# Patient Record
Sex: Female | Born: 1955 | Race: Black or African American | Hispanic: No | Smoking: Former smoker
Health system: Southern US, Community
[De-identification: ages and names within clinical notes are randomized; demographics above are authoritative.]

## PROBLEM LIST (undated history)

## (undated) DIAGNOSIS — I10 Essential (primary) hypertension: Secondary | ICD-10-CM

## (undated) DIAGNOSIS — K222 Esophageal obstruction: Secondary | ICD-10-CM

## (undated) DIAGNOSIS — T7840XA Allergy, unspecified, initial encounter: Secondary | ICD-10-CM

## (undated) DIAGNOSIS — E785 Hyperlipidemia, unspecified: Secondary | ICD-10-CM

## (undated) DIAGNOSIS — K219 Gastro-esophageal reflux disease without esophagitis: Secondary | ICD-10-CM

## (undated) DIAGNOSIS — E559 Vitamin D deficiency, unspecified: Secondary | ICD-10-CM

## (undated) DIAGNOSIS — F419 Anxiety disorder, unspecified: Secondary | ICD-10-CM

## (undated) DIAGNOSIS — F329 Major depressive disorder, single episode, unspecified: Secondary | ICD-10-CM

## (undated) DIAGNOSIS — F32A Depression, unspecified: Secondary | ICD-10-CM

## (undated) HISTORY — DX: Depression, unspecified: F32.A

## (undated) HISTORY — DX: Esophageal obstruction: K22.2

## (undated) HISTORY — DX: Anxiety disorder, unspecified: F41.9

## (undated) HISTORY — PX: CHOLECYSTECTOMY: SHX55

## (undated) HISTORY — DX: Gastro-esophageal reflux disease without esophagitis: K21.9

## (undated) HISTORY — DX: Allergy, unspecified, initial encounter: T78.40XA

## (undated) HISTORY — DX: Major depressive disorder, single episode, unspecified: F32.9

## (undated) HISTORY — DX: Vitamin D deficiency, unspecified: E55.9

## (undated) HISTORY — DX: Hyperlipidemia, unspecified: E78.5

## (undated) HISTORY — DX: Essential (primary) hypertension: I10

---

## 1998-01-28 ENCOUNTER — Ambulatory Visit (HOSPITAL_COMMUNITY): Admission: RE | Admit: 1998-01-28 | Discharge: 1998-01-28 | Payer: Self-pay | Admitting: *Deleted

## 1999-09-08 ENCOUNTER — Other Ambulatory Visit: Admission: RE | Admit: 1999-09-08 | Discharge: 1999-09-08 | Payer: Self-pay | Admitting: *Deleted

## 2001-03-28 ENCOUNTER — Other Ambulatory Visit: Admission: RE | Admit: 2001-03-28 | Discharge: 2001-03-28 | Payer: Self-pay | Admitting: Ophthalmology

## 2002-05-26 ENCOUNTER — Other Ambulatory Visit: Admission: RE | Admit: 2002-05-26 | Discharge: 2002-05-26 | Payer: Self-pay | Admitting: Internal Medicine

## 2002-12-11 HISTORY — PX: LEEP: SHX91

## 2003-01-13 ENCOUNTER — Other Ambulatory Visit: Admission: RE | Admit: 2003-01-13 | Discharge: 2003-01-13 | Payer: Self-pay | Admitting: Internal Medicine

## 2003-01-21 ENCOUNTER — Encounter: Payer: Self-pay | Admitting: Internal Medicine

## 2003-01-21 ENCOUNTER — Ambulatory Visit (HOSPITAL_COMMUNITY): Admission: RE | Admit: 2003-01-21 | Discharge: 2003-01-21 | Payer: Self-pay | Admitting: Internal Medicine

## 2003-03-31 ENCOUNTER — Observation Stay (HOSPITAL_COMMUNITY): Admission: RE | Admit: 2003-03-31 | Discharge: 2003-04-01 | Payer: Self-pay | Admitting: Surgery

## 2003-03-31 ENCOUNTER — Encounter (INDEPENDENT_AMBULATORY_CARE_PROVIDER_SITE_OTHER): Payer: Self-pay | Admitting: Specialist

## 2004-01-19 ENCOUNTER — Other Ambulatory Visit: Admission: RE | Admit: 2004-01-19 | Discharge: 2004-01-19 | Payer: Self-pay | Admitting: Internal Medicine

## 2004-03-29 ENCOUNTER — Ambulatory Visit (HOSPITAL_COMMUNITY): Admission: RE | Admit: 2004-03-29 | Discharge: 2004-03-29 | Payer: Self-pay | Admitting: Obstetrics and Gynecology

## 2004-05-23 ENCOUNTER — Other Ambulatory Visit: Admission: RE | Admit: 2004-05-23 | Discharge: 2004-05-23 | Payer: Self-pay | Admitting: Internal Medicine

## 2004-11-11 ENCOUNTER — Emergency Department (HOSPITAL_COMMUNITY): Admission: EM | Admit: 2004-11-11 | Discharge: 2004-11-11 | Payer: Self-pay | Admitting: Emergency Medicine

## 2004-11-12 ENCOUNTER — Ambulatory Visit: Payer: Self-pay | Admitting: Psychiatry

## 2004-11-12 ENCOUNTER — Inpatient Hospital Stay (HOSPITAL_COMMUNITY): Admission: EM | Admit: 2004-11-12 | Discharge: 2004-11-14 | Payer: Self-pay | Admitting: Psychiatry

## 2005-01-19 ENCOUNTER — Other Ambulatory Visit: Admission: RE | Admit: 2005-01-19 | Discharge: 2005-01-19 | Payer: Self-pay | Admitting: Internal Medicine

## 2005-09-18 ENCOUNTER — Inpatient Hospital Stay (HOSPITAL_COMMUNITY): Admission: EM | Admit: 2005-09-18 | Discharge: 2005-09-21 | Payer: Self-pay | Admitting: Psychiatry

## 2005-09-18 ENCOUNTER — Emergency Department (HOSPITAL_COMMUNITY): Admission: EM | Admit: 2005-09-18 | Discharge: 2005-09-18 | Payer: Self-pay | Admitting: Emergency Medicine

## 2005-09-18 ENCOUNTER — Ambulatory Visit: Payer: Self-pay | Admitting: Psychiatry

## 2005-09-29 ENCOUNTER — Ambulatory Visit (HOSPITAL_COMMUNITY): Payer: Self-pay | Admitting: Psychiatry

## 2006-02-05 ENCOUNTER — Ambulatory Visit (HOSPITAL_COMMUNITY): Payer: Self-pay | Admitting: Psychiatry

## 2006-02-13 ENCOUNTER — Other Ambulatory Visit: Admission: RE | Admit: 2006-02-13 | Discharge: 2006-02-13 | Payer: Self-pay | Admitting: Internal Medicine

## 2007-02-18 ENCOUNTER — Other Ambulatory Visit: Admission: RE | Admit: 2007-02-18 | Discharge: 2007-02-18 | Payer: Self-pay | Admitting: Internal Medicine

## 2007-02-25 ENCOUNTER — Ambulatory Visit (HOSPITAL_COMMUNITY): Admission: RE | Admit: 2007-02-25 | Discharge: 2007-02-25 | Payer: Self-pay | Admitting: Internal Medicine

## 2007-12-12 HISTORY — PX: ESOPHAGEAL DILATION: SHX303

## 2008-01-06 ENCOUNTER — Ambulatory Visit: Payer: Self-pay | Admitting: Gastroenterology

## 2008-01-09 ENCOUNTER — Ambulatory Visit: Payer: Self-pay | Admitting: Gastroenterology

## 2008-01-09 DIAGNOSIS — I119 Hypertensive heart disease without heart failure: Secondary | ICD-10-CM

## 2008-01-09 DIAGNOSIS — F418 Other specified anxiety disorders: Secondary | ICD-10-CM

## 2008-01-09 DIAGNOSIS — F339 Major depressive disorder, recurrent, unspecified: Secondary | ICD-10-CM | POA: Insufficient documentation

## 2008-01-09 DIAGNOSIS — K222 Esophageal obstruction: Secondary | ICD-10-CM

## 2008-01-09 DIAGNOSIS — K219 Gastro-esophageal reflux disease without esophagitis: Secondary | ICD-10-CM

## 2008-01-22 ENCOUNTER — Ambulatory Visit (HOSPITAL_COMMUNITY): Admission: RE | Admit: 2008-01-22 | Discharge: 2008-01-22 | Payer: Self-pay | Admitting: Gastroenterology

## 2008-01-29 ENCOUNTER — Ambulatory Visit: Payer: Self-pay | Admitting: Gastroenterology

## 2008-02-18 ENCOUNTER — Other Ambulatory Visit: Admission: RE | Admit: 2008-02-18 | Discharge: 2008-02-18 | Payer: Self-pay | Admitting: Internal Medicine

## 2008-08-25 ENCOUNTER — Telehealth: Payer: Self-pay | Admitting: Gastroenterology

## 2008-12-25 ENCOUNTER — Emergency Department (HOSPITAL_COMMUNITY): Admission: EM | Admit: 2008-12-25 | Discharge: 2008-12-25 | Payer: Self-pay | Admitting: Emergency Medicine

## 2009-01-06 ENCOUNTER — Ambulatory Visit (HOSPITAL_COMMUNITY): Admission: RE | Admit: 2009-01-06 | Discharge: 2009-01-06 | Payer: Self-pay | Admitting: Internal Medicine

## 2009-09-27 ENCOUNTER — Ambulatory Visit (HOSPITAL_COMMUNITY): Admission: RE | Admit: 2009-09-27 | Discharge: 2009-09-27 | Payer: Self-pay | Admitting: Internal Medicine

## 2009-11-16 ENCOUNTER — Other Ambulatory Visit: Admission: RE | Admit: 2009-11-16 | Discharge: 2009-11-16 | Payer: Self-pay | Admitting: Internal Medicine

## 2009-12-23 ENCOUNTER — Encounter (INDEPENDENT_AMBULATORY_CARE_PROVIDER_SITE_OTHER): Payer: Self-pay | Admitting: *Deleted

## 2010-01-20 ENCOUNTER — Ambulatory Visit (HOSPITAL_COMMUNITY): Admission: RE | Admit: 2010-01-20 | Discharge: 2010-01-20 | Payer: Self-pay | Admitting: Internal Medicine

## 2010-01-26 ENCOUNTER — Encounter (INDEPENDENT_AMBULATORY_CARE_PROVIDER_SITE_OTHER): Payer: Self-pay | Admitting: *Deleted

## 2010-01-27 ENCOUNTER — Telehealth: Payer: Self-pay | Admitting: Gastroenterology

## 2010-01-27 ENCOUNTER — Ambulatory Visit: Payer: Self-pay | Admitting: Gastroenterology

## 2010-02-08 ENCOUNTER — Ambulatory Visit: Payer: Self-pay | Admitting: Gastroenterology

## 2010-02-08 LAB — HM COLONOSCOPY

## 2011-01-01 ENCOUNTER — Encounter: Payer: Self-pay | Admitting: Obstetrics and Gynecology

## 2011-01-10 NOTE — Letter (Signed)
Summary: [facility] Instructions  [facility]  [address] [place], [place] [address]   Phone: [phone]  Fax: [phone]       Kaitlyn [name]    09/01/1956    MRN: [id number]        Procedure Day Dorna Bloom:  Jake Shark  02/08/10     Arrival Time:  8:00AM     Procedure Time:  9:00AM     Location of Procedure:                    Juliann Pares _  [facility] (4th Floor)                        PREPARATION FOR COLONOSCOPY WITH MOVIPREP   Starting 5 days prior to your procedure 02/03/10 do not eat nuts, seeds, popcorn, corn, beans, peas,  salads, or any raw vegetables.  Do not take any fiber supplements (e.g. Metamucil, Citrucel, and Benefiber).  THE DAY BEFORE YOUR PROCEDURE         DATE: 02/07/10  DAY: MONDAY  1.  Drink clear liquids the entire day-NO SOLID FOOD  2.  Do not drink anything colored red or purple.  Avoid juices with pulp.  No orange juice.  3.  Drink at least 64 oz. (8 glasses) of fluid/clear liquids during the day to prevent dehydration and help the prep work efficiently.  CLEAR LIQUIDS INCLUDE: Water Jello Ice Popsicles Tea (sugar ok, no milk/cream) Powdered fruit flavored drinks Coffee (sugar ok, no milk/cream) Gatorade Juice: apple, white grape, white cranberry  Lemonade Clear bullion, consomm, broth Carbonated beverages (any kind) Strained chicken noodle soup Hard Candy                             4.  In the morning, mix first dose of MoviPrep solution:    Empty 1 Pouch A and 1 Pouch B into the disposable container    Add lukewarm drinking water to the top line of the container. Mix to dissolve    Refrigerate (mixed solution should be used within 24 hrs)  5.  Begin drinking the prep at 5:00 p.m. The MoviPrep container is divided by 4 marks.   Every 15 minutes drink the solution down to the next mark (approximately 8 oz) until the full liter is complete.   6.  Follow completed prep with 16 oz of clear liquid of your choice (Nothing  red or purple).  Continue to drink clear liquids until bedtime.  7.  Before going to bed, mix second dose of MoviPrep solution:    Empty 1 Pouch A and 1 Pouch B into the disposable container    Add lukewarm drinking water to the top line of the container. Mix to dissolve    Refrigerate  THE DAY OF YOUR PROCEDURE      DATE: 02/08/10  DAY: TUESDAY  Beginning at 4:00a.m. (5 hours before procedure):         1. Every 15 minutes, drink the solution down to the next mark (approx 8 oz) until the full liter is complete.  2. Follow completed prep with 16 oz. of clear liquid of your choice.    3. You may drink clear liquids until 7:00AM (2 HOURS BEFORE PROCEDURE).   MEDICATION INSTRUCTIONS  Unless otherwise instructed, you should take regular prescription medications with a small sip of water   as early as possible the morning  of your procedure.         OTHER INSTRUCTIONS  You will need a responsible adult at least 53 years of age to accompany you and drive you home.   This person must remain in the waiting room during your procedure.  Wear loose fitting clothing that is easily removed.  Leave jewelry and other valuables at home.  However, you may wish to bring a book to read or  an iPod/MP3 player to listen to music as you wait for your procedure to start.  Remove all body piercing jewelry and leave at home.  Total time from sign-in until discharge is approximately 2-3 hours.  You should go home directly after your procedure and rest.  You can resume normal activities the  day after your procedure.  The day of your procedure you should not:   Drive   Make legal decisions   Operate machinery   Drink alcohol   Return to work  You will receive specific instructions about eating, activities and medications before you leave.    The above instructions have been reviewed and explained to me by  Wyona Almas RN  January 27, 2010 10:21 AM     I fully understand and  can verbalize these instructions _____________________________ Date _________

## 2011-01-10 NOTE — Progress Notes (Signed)
Summary: Medication refill   Phone Note Call from Patient Call back at Home Phone [phone]   Caller: Patient Call For: Dr. Arlyce Dice Reason for Call: Refill Medication Summary of Call: Pt. needs her Omeprazole refilled. [facility] Initial call taken by: Karna Christmas,  January 27, 2010 10:31 AM  Follow-up for Phone Call        ok Follow-up by: Louis Meckel MD,  January 27, 2010 3:14 PM    Prescriptions: OMEPRAZOLE 20 MG  CPDR (OMEPRAZOLE) take 2 capsules once at bedtime every night  #60 x 11   Entered by:   Chales Abrahams CMA (AAMA)   Authorized by:   Louis Meckel MD   Signed by:   Chales Abrahams CMA (AAMA) on 01/27/2010   Method used:   [facility]        [facility] [address] [phone]* (retail)       [address]       [place], [place]  [address]       Ph: [phone] or [phone]       Fax: [phone]   RxID:   [id number]

## 2011-01-10 NOTE — Miscellaneous (Signed)
Summary: [facility] Previsit/prep  Clinical Lists Changes  Medications: Added new medication of MOVIPREP 100 GM  SOLR (PEG-KCL-NACL-NASULF-NA ASC-C) As per prep instructions. - Signed Rx of MOVIPREP 100 GM  SOLR (PEG-KCL-NACL-NASULF-NA ASC-C) As per prep instructions.;  #1 x 0;  Signed;  Entered by: Wyona Almas RN;  Authorized by: Louis Meckel MD;  Method used: [facility]ically to [facility] [address] [phone]*, [address], [place], [place], [place]  [address], Ph: [phone] or [phone], Fax: [phone] Observations: Added new observation of NKA: T (01/27/2010 9:57)    Prescriptions: MOVIPREP 100 GM  SOLR (PEG-KCL-NACL-NASULF-NA ASC-C) As per prep instructions.  #1 x 0   Entered by:   Wyona Almas RN   Authorized by:   Louis Meckel MD   Signed by:   Wyona Almas RN on 01/27/2010   Method used:   Electronically to        [facility] [address] [phone]* (retail)       [address]       [place], [place]  [address]       Ph: [phone] or [phone]       Fax: [phone]   RxID:   [id number]

## 2011-01-10 NOTE — Procedures (Signed)
Summary: Colonoscopy  Patient: [name] Note: All result statuses are Final unless otherwise noted.  Tests: (1) Colonoscopy (COL)   COL Colonoscopy           DONE     [facility]     [address]     [place], [place]  [address]           COLONOSCOPY PROCEDURE REPORT           PATIENT:  Kaitlyn, [name]  MR#:  [id number]     BIRTHDATE:  06-Aug-1956, 54 yrs. old  GENDER:  female           ENDOSCOPIST:  Barbette Hair. Arlyce Dice, MD     Referred by:           PROCEDURE DATE:  02/08/2010     PROCEDURE:  Colonoscopy, Diagnostic     ASA CLASS:  Class II     INDICATIONS:  Routine Risk Screening           MEDICATIONS:   Fentanyl 75 mcg IV, Versed 7 mg IV, Benadryl 50 mg     IV           DESCRIPTION OF PROCEDURE:   After the risks benefits and     alternatives of the procedure were thoroughly explained, informed     consent was obtained.  Digital rectal exam was performed and     revealed no abnormalities.   The LB CF-[id number] endoscope     was introduced through the anus and advanced to the cecum, which     was identified by both the appendix and ileocecal valve, without     limitations.  The quality of the prep was excellent, using     MoviPrep.  The instrument was then slowly withdrawn as the colon     was fully examined.     <<PROCEDUREIMAGES>>           FINDINGS:  A normal appearing cecum, ileocecal valve, and     appendiceal orifice were identified. The ascending, hepatic     flexure, transverse, splenic flexure, descending, sigmoid colon,     and rectum appeared unremarkable (see image1, image2, image4,     image6, image5, and image7).   Retroflexed views in the rectum     revealed no abnormalities.    The scope was then withdrawn from     the patient and the procedure completed.           COMPLICATIONS:  None           ENDOSCOPIC IMPRESSION:     1) Normal colon     RECOMMENDATIONS:     1) Continue current colorectal screening recommendations for     "routine  risk" patients with a repeat colonoscopy in 10 years.           REPEAT EXAM:  In 10 year(s) for Colonoscopy.           ______________________________     Barbette Hair. Arlyce Dice, MD           CC:  Marisue Brooklyn, DO           n.     eSIGNED:   Barbette Hair. [place] at 02/08/2010 09:48 AM           Elizebeth Koller, [id number]  Note: An exclamation mark (!) indicates a result that was not dispersed into the flowsheet. Document Creation Date: 02/08/2010 9:48 AM _______________________________________________________________________  (1) Order result status: Final  Collection or observation date-time: 02/08/2010 09:41 Requested date-time:  Receipt date-time:  Reported date-time:  Referring Physician:   Ordering Physician: Melvia Heaps [phone]) Specimen Source:  Source: Launa Grill Order Number: [id number] Lab site:   Appended Document: Colonoscopy    Clinical Lists Changes  Observations: Added new observation of COLONNXTDUE: 02/2020 (02/08/2010 10:55)

## 2011-01-10 NOTE — Letter (Signed)
Summary: Previsit letter  [facility]  [address] [place], [place] [address]   Phone: [phone]  Fax: [phone]       12/23/2009 MRN: [id number]  [facility] [address] [place], [place]  [address]  Dear Ms. Edds,  Welcome to the Gastroenterology Division at [facility].    You are scheduled to see a nurse for your pre-procedure visit on 01-11-10 at 2:00p.m. on the 3rd floor at [facility], [address]  We ask that you try to arrive at our office 15 minutes prior to your appointment time to allow for check-in.  Your nurse visit will consist of discussing your medical and surgical history, your immediate family medical history, and your medications.    Please bring a complete list of all your medications or, if you prefer, bring the medication bottles and we will list them.  We will need to be aware of both prescribed and over the counter drugs.  We will need to know exact dosage information as well.  If you are on blood thinners (Coumadin, Plavix, Aggrenox, Ticlid, etc.) please call our office today/prior to your appointment, as we need to consult with your physician about holding your medication.   Please be prepared to read and sign documents such as consent forms, a financial agreement, and acknowledgement forms.  If necessary, and with your consent, a friend or relative is welcome to sit-in on the nurse visit with you.  Please bring your insurance card so that we may make a copy of it.  If your insurance requires a referral to see a specialist, please bring your referral form from your primary care physician.  No co-pay is required for this nurse visit.     If you cannot keep your appointment, please call [phone] to cancel or reschedule prior to your appointment date.  This allows [place] the opportunity to schedule an appointment for another patient in need of care.    Thank you for choosing [facility] for your medical  needs.  We appreciate the opportunity to care for you.  Please visit [place] at our website  to learn more about our practice.                     Sincerely.                                                                                                                   The Gastroenterology Division

## 2011-03-27 LAB — POCT CARDIAC MARKERS: Troponin i, poc: <0.05 ng/mL (ref 0.00–0.09)

## 2011-03-27 LAB — DIFFERENTIAL
Eosinophils Absolute: 0.2 10*3/uL (ref 0.0–0.7)
Eosinophils Relative: 3 % (ref 0–5)
Lymphs Abs: 2 10*3/uL (ref 0.7–4.0)
Monocytes Absolute: 0.5 10*3/uL (ref 0.1–1.0)

## 2011-03-27 LAB — COMPREHENSIVE METABOLIC PANEL
ALT: 16 U/L (ref 0–35)
AST: 20 U/L (ref 0–37)
Albumin: 3.7 g/dL (ref 3.5–5.2)
CO2: 27 mEq/L (ref 19–32)
Calcium: 9.6 mg/dL (ref 8.4–10.5)
Chloride: 105 mEq/L (ref 96–112)
GFR calc Af Amer: >60 mL/min (ref >60)
GFR calc non Af Amer: >60 mL/min (ref >60)
Sodium: 140 mEq/L (ref 135–145)

## 2011-03-27 LAB — CBC
MCHC: 34.1 g/dL (ref 30.0–36.0)
RBC: 4.55 MIL/uL (ref 3.87–5.11)
WBC: 5.2 10*3/uL (ref 4.0–10.5)

## 2011-03-27 LAB — LIPASE, BLOOD: Lipase: 24 U/L (ref 11–59)

## 2011-04-25 NOTE — Letter (Signed)
January 06, 2008    Lovenia Kim, D.O.  [address]  [place], [place] [address]   RE:  [name], Kaitlyn  MRN:  [id number]  /  DOB:  1956/05/13   Dear Dr. Elisabeth Most:   Upon your kind referral, I had the pleasure of evaluating your patient  and I am pleased to offer my findings.  I saw Santia Labate in the office  today.  Enclosed is a copy of my progress note that details my findings  and recommendations.   Thank you for the opportunity to participate in your patient's care.    Sincerely,      Barbette Hair. Arlyce Dice, MD,FACG  Electronically Signed    RDK/MedQ  DD: 01/06/2008  DT: 01/06/2008  Job #: [id number]

## 2011-04-25 NOTE — Assessment & Plan Note (Signed)
[facility]                         GASTROENTEROLOGY OFFICE NOTE   Kaitlyn [name], Kaitlyn [name]                       MRN:          [id number]  DATE:01/06/2008                            DOB:          [birth year]    PROBLEM:  Dysphagia.   Kaitlyn [name] is a 55 year old African-American female referred through the  courtesy of Dr. Elisabeth [name] for evaluation.  Over the last 5 to 6 months,  she has been complaining of dysphagia to solids, and now liquids.  She  has frequent cough, especially after retiring.  She has been complaining  of sore throat because of her cough.  She denies pyrosis, per se.  She  has been taking Zegerid in the mid evening with some improvement in her  cough.  She is on no gastric irritants, including nonsteroidals.  She is  constantly clearing her throat.  She has had mild nausea over the past 2  months.   PAST MEDICAL HISTORY:  Pertinent for hypertension and depression.  She  is status post cholecystectomy.  She has glaucoma.   FAMILY HISTORY:  Pertinent for a sister with breast cancer and 2  siblings with diabetes.   MEDICATIONS:  Doxazosin.  Pravastatin.  Bupropion.  Travatan.  Loratadine.  Zegerid.  Baby aspirin.   She has no allergies.   She stopped smoking a year ago.  She drinks rarely.  She is divorced and  works Clinical biochemist for [employer].   REVIEW OF SYSTEMS:  Positive for night sweats, back pain.   EXAM:  Pulse 80, blood pressure 150/90, weight 151.  HEENT:  EOMI. PERRLA. Sclerae are anicteric.  Conjunctivae are pink.  NECK:  Supple without thyromegaly, adenopathy or carotid bruits.  CHEST:  Clear to auscultation and percussion without adventitious  sounds.  CARDIAC:  Regular rhythm; normal S1 S2.  There are no murmurs, gallops  or rubs.  ABDOMEN:  Bowel sounds are normoactive.  Abdomen is soft, non-tender and  non-distended.  There are no abdominal masses, tenderness, splenic  enlargement or hepatomegaly.  EXTREMITIES:  Full range of motion.  No cyanosis, clubbing or edema.  RECTAL:  Deferred.   IMPRESSION:  1. Dysphagia.  This is likely due to peptic stricture.  2. Cough.  I suspect this is related to gastroesophageal reflux.      Gastroparesis is also a consideration.   RECOMMENDATION:  1. I instructed Kaitlyn [name] to take Zegerid nightly.  2. Upper endoscopy with dilatation as indicated.  3. Consider gastric emptying scan if cough does not improve.     Kaitlyn [name]. Kaitlyn Dice, MD,FACG  Electronically Signed    RDK/MedQ  DD: 01/06/2008  DT: 01/06/2008  Job #: [id number]   cc:   Kaitlyn [name], D.O.

## 2011-04-25 NOTE — Assessment & Plan Note (Signed)
[facility]                         GASTROENTEROLOGY OFFICE NOTE   BRELEIGH, CARPINO                       MRN:          [id number]  DATE:01/29/2008                            DOB:          12-Jun-1956    PROBLEM:  Cough.   Ms. Lafontant has returned for scheduled GI followup.  Upper endoscopy  demonstrated a distal esophageal stricture.  This was dilated to 18 mm.  On a regimen of Zegerid 40 mg nightly, her cough has entirely  disappeared.  She no longer has dysphagia.  Her insurance company has  requested that we switch her to omeprazole.  A gastric emptying scan did  demonstrate delayed emptying.  There was 45.9% remaining at 2 hours with  normal less than 30%.  Ms. Benton denies nausea or bloating.   PHYSICAL EXAMINATION:  Pulse 88, blood pressure 128/80, weight 163.   IMPRESSION:  1. Gastroesophageal reflux disease - well controlled with Zegerid.      Gastroparesis certainly may be contributing.  2. Esophageal stricture - asymptomatic following dilatation therapy.  3. Gastroparesis.   RECOMMENDATIONS:  Switch to omeprazole 40 mg nightly.  If she remains  asymptomatic, then I do not think we have to treat her gastroparesis.  Should cough recur, then I would consider adding Reglan.     Barbette Hair. Arlyce Dice, MD,FACG  Electronically Signed    RDK/MedQ  DD: 01/29/2008  DT: 01/30/2008  Job #: [id number]   cc:   Lovenia Kim, D.O.

## 2011-04-25 NOTE — Letter (Signed)
January 06, 2008    Kaitlyn [name]. Kaitlyn [name]   RE:  EDDIS, PINGLETON  MRN:  [id number]  /  DOB:  [birth year]   Dear Ms. Balke:   It is my pleasure to have treated you recently as a new patient in my  office.  I appreciate your confidence and the opportunity to participate  in your care.   Since I do have a busy inpatient endoscopy schedule and office schedule,  my office hours vary weekly.  I am, however, available for emergency  calls every day through my office.  If I cannot promptly meet an urgent  office appointment, another one of our gastroenterologists will be able  to assist you.   My well-trained staff are prepared to help you at all times.  For  emergencies after office hours, a physician from our gastroenterology  section is always available through my 24-hour answering service.   While you are under my care, I encourage discussion of your questions  and concerns, and I will be happy to return your calls as soon as I am  available.   Once again, I welcome you as a new patient and I look forward to a happy  and healthy relationship.    Sincerely,      Barbette Hair. Arlyce Dice, MD,FACG  Electronically Signed   RDK/MedQ  DD: 01/06/2008  DT: 01/06/2008  Job #: [id number]

## 2011-04-28 NOTE — Discharge Summary (Signed)
Kaitlyn [name], Kaitlyn [name]                ACCOUNT NO.:  [id number]   MEDICAL RECORD NO.:  [id number]          PATIENT TYPE:  IPS   LOCATION:  0508                          [facility]ILITY[facility]  [facility]   PHYSICIAN:  Jeanice Lim, M.D. DATE OF BIRTH:  09/28/1956   DATE OF ADMISSION:  09/18/2005  DATE OF DISCHARGE:  09/21/2005                                 DISCHARGE SUMMARY   IDENTIFYING DATA:  This is a 55 year old married African-American female  involuntarily committed.  Presents with suicide attempt, sitting in car with  windows rolled up and had had marital conflict with husband until husband  apparently told her that he wanted to separated.  She was hoping to pass out  and get a reaction from husband as per patient.  Had denied that this was a  serious attempt.   PAST PSYCHIATRIC HISTORY:  Second admission to [facility] in the summer of 2005.  Overdosed.  Had been on Cymbalta earlier this year but noncompliant with a  further follow-up.   MEDICATIONS:  Hydrochlorothiazide, birth control pill, triamterene.   ALLERGIES:  No known drug allergies.   PHYSICAL EXAMINATION:  Physical and neurologic exam within normal limits.   LABORATORY DATA:  Routine admission labs within normal limits.   MENTAL STATUS EXAM:  In bed, cooperative.  Poor eye contact.  Speech clear.  Mood depressed.  Affect sad and restricted.  Thought processes positive for  suicidal ideation, fleeting, denied acute intent.  Cognition intact.  Judgment and insight poor.   ADMISSION DIAGNOSES:  AXIS I:  Depressive disorder not otherwise specified.  Rule out major depressive disorder, recurrent, moderate.  AXIS II:  Deferred.  AXIS III:  Hypertension, glaucoma.  AXIS IV:  Moderate (problems with primary support group).  AXIS V:  30/60.   HOSPITAL COURSE:  The patient was admitted and ordered routine p.r.n.  medications and underwent further monitoring.  Was encouraged to participate  in individual, group and milieu therapy.  Family  session was requested with  husband.  The patient was initially refusing medications.  She reported  depression, tearful, guarded and reports that her suicidal ideation would  depend on how her husband reacts and was not sure how he was going to react  to what he had done.  Family session went well.  Husband was supportive.  The patient was pleased and reported feeling safe and no longer suicidal.  Reported a positive response to crisis intervention, was sleeping and eating  well.  Affect brighter.  No dangerous ideation.  No mood instability.  No  side effects from medications.  The patient was given medication education.   DISCHARGE MEDICATIONS:  1.  Hydrochlorothiazide 25 mg q.a.m.  2.  Travatan 0.004% ophthalmic solution OP at night.  3.  Necon 1-35 birth control pill 1 daily as previously directed.  4.  Zoloft 25 mg at 6 p.m. for one week and then 50 mg at 6 p.m.  5.  Trazodone 50 mg, 1/2-1 at 8 p.m. p.r.n. insomnia.  6.  Risperdal 0.5 mg at 8 p.m.   FOLLOW UP:  The patient was provided  samples and prescriptions and was to  follow up with Dr. Lolly Mustache September 27, 2005 at 1:15 p.m. and Milbert Coulter  for DBT on September 26, 2005 at 6 p.m.   DISCHARGE DIAGNOSES:  AXIS I:  Depressive disorder not otherwise specified.  Rule out major depressive disorder, recurrent, moderate.  AXIS II:  Deferred.  AXIS III:  Hypertension, glaucoma.  AXIS IV:  Moderate (problems with primary support group).  AXIS V:  GAF on discharge 55.      Jeanice Lim, M.D.  Electronically Signed     JEM/MEDQ  D:  10/15/2005  T:  10/16/2005  Job:  [id number]

## 2011-04-28 NOTE — H&P (Signed)
Kaitlyn [name], Kaitlyn [name]                ACCOUNT NO.:  [id number]   MEDICAL RECORD NO.:  [id number]          PATIENT TYPE:  IPS   LOCATION:  0304                          [facility]ILITY[facility]  [facility]   PHYSICIAN:  Geoffery Lyons, M.D.      DATE OF BIRTH:  [birth year]   DATE OF ADMISSION:  11/12/2004  DATE OF DISCHARGE:                         PSYCHIATRIC ADMISSION ASSESSMENT   IDENTIFYING INFORMATION:  This is an involuntary admission.  This is a 56-  year-old African-American female, separated x 1 week.   REASON FOR ADMISSION AND SYMPTOMS:  Apparently, the patient's brother-in-law  committed her.  She was found to be actively suicidal.  She reported having  attempted in the past.  She would contract for safety.  Apparently, she  separated from her husband of three years last week.  Today, she denies  being suicidal or homicidal.  However, she is quite depressed.  Her affect  is very flat.  She responds minimally.   PAST PSYCHIATRIC HISTORY:  She denies.  However, she has been prescribed  antidepressants in the past.  The most recent was Cymbalta but she has not  picked it up since April.   SOCIAL HISTORY:  She finished high school.  She has been married 3-1/2  years.  She has no children.  She is currently employed at [employer].   FAMILY HISTORY:  She denies.   ALCOHOL/DRUG HISTORY:  She denies.  Indeed, her evaluation in the emergency  room was negative for drugs or alcohol.  However, she was noted to have a  moderate amount of leukocyte esterase so she will be treated for a UTI.   PRIMARY CARE PHYSICIAN:  Dr. Elisabeth Most in [place].   MEDICAL HISTORY:  She is followed for hypertension and glaucoma.   MEDICATIONS:  Currently, [facility], she is prescribed  hydrochlorothiazide 25 mg p.o. q.d., Diovan 320 mg p.o. q.d. and Travatan 1  drop each eye q.h.s. and she also takes Necon 1/35 p.o. q.d. for birth  control.   ALLERGIES:  No known drug allergies.   POSITIVE  PHYSICAL FINDINGS:  She was examined in the emergency room.  There  were no remarkable findings.   MENTAL STATUS EXAM:  She is drowsy but alert.  She is appropriately groomed  and dressed.  Motor and gait are normal.  She had moderately good eye  contact.  Her speech is slow.  Her mood is depressed.  Her affect is flat.  Her thought processes are clear and linear.  Judgment and insight are fair.  Concentration and memory were intact.  Intelligence is at least average.  She currently denies suicidal or homicidal ideation.  She claims she does  not understand where all that came from last night.   DIAGNOSES:   AXIS I:  Major depressive disorder, recurrent, severe with suicidal  ideation.   AXIS II:  Deferred.   AXIS III:  1.  Hypertension.  2.  Glaucoma.  3.  Urinary tract infection.   AXIS IV:  Severe (problems with primary support group).   AXIS V:  35.  PLAN:  She will be admitted for safety and stabilization.  We will treat her  UTI and start an antidepressant.     Mick   MD/MEDQ  D:  11/12/2004  T:  11/12/2004  Job:  [id number]

## 2011-04-28 NOTE — Op Note (Signed)
NAME:  Kaitlyn [name], Kaitlyn [name]                          ACCOUNT NO.:  [id number]   MEDICAL RECORD NO.:  [id number]                   PATIENT TYPE:  AMB   LOCATION:  DAY                                  FACILITY:  [facility]   PHYSICIAN:  Abigail Miyamoto, M.D.              DATE OF BIRTH:  1956/11/19   DATE OF PROCEDURE:  03/31/2003  DATE OF DISCHARGE:                                 OPERATIVE REPORT   PREOPERATIVE DIAGNOSES:  Symptomatic cholelithiasis.   POSTOPERATIVE DIAGNOSES:  Symptomatic cholelithiasis.   PROCEDURE:  Laparoscopic cholecystectomy.   SURGEON:  Abigail Miyamoto, M.D.   ASSISTANT:  Lebron Conners, M.D.   ANESTHESIA:  General endotracheal anesthesia.   ESTIMATED BLOOD LOSS:  Minimal.   DESCRIPTION OF PROCEDURE:  The patient was brought to the operating room,  identified as Kaitlyn [name]. She was placed supine on the operating room  table and general anesthesia was induced. Her abdomen was then prepped and  draped in the usual sterile fashion. Using a #15 blade, a small vertical  incision was made just below the umbilicus through a previous old scar. The  incision was carried down to the fascia which was then opened with the  scalpel. The hemostat was then passed through the peritoneal cavity. Next, a  #0 Vicryl pursestring suture was placed around the fascial opening. The  Hasson port was placed through opening and insufflation of the abdomen was  begun. A 12 mm port was then placed in the patient's epigastrium, two 5 mm  ports were placed in the patient's right flank under direct vision. The  gallbladder was identified and retracted above the liver bed. The liver bed  was found to be quite friable and the liver toward the gallbladder was  elevated. The cystic duct was dissected out and clipped twice proximally,  once distally and transected with the scissors. The cystic artery and branch  were identified and clipped twice proximally, once distally and transected  as  well. The gallbladder was then slowly dissected free from the liver bed  with the electrocautery. Again the liver bed was quite friable during the  dissection. Once the gallbladder was free from the liver bed, hemostasis was  achieved was achieved with the electrocautery and a couple of pieces of  Surgicel. Again the liver bed was examined, hemostasis was achieved. At this  point, the gallbladder was removed through the incision at the umbilicus. #0  Vicryl the umbilicus was tied closing the fascial defect. Again the liver  bed was examined and hemostasis was felt to be achieved. The abdomen was  then copiously irrigated with normal saline. All ports were then removed  under direct vision and the abdomen was deflated. All incisions were then  anesthetized with 0.25% Marcaine and closed with 4-0 Monocryl subcuticular  sutures. Steri-Strips, gauze and tape were then applied. The patient  tolerated the procedure well. All sponge, needle and instrument counts  were  correct at the end of the procedure. The patient was then extubated in the  operating room and taken in stable condition to the recovery room.                                               Abigail Miyamoto, M.D.   DB/MEDQ  D:  03/31/2003  T:  03/31/2003  Job:  [id number]

## 2011-04-28 NOTE — Discharge Summary (Signed)
NAMEJACQULENE, Kaitlyn [name] NO.:  [id number]   MEDICAL RECORD NO.:  [id number]          PATIENT TYPE:  IPS   LOCATION:  0304                          FACILITY:  [facility]   PHYSICIAN:  Jeanice Lim, M.D. DATE OF BIRTH:  10-12-1956   DATE OF ADMISSION:  11/12/2004  DATE OF DISCHARGE:  11/14/2004                                 DISCHARGE SUMMARY   IDENTIFYING DATA:  This is a 55 year old African-American female, separated,  admitted actively suicidal, attempted in the past, unable to contract for  safety. Split with husband last week. Alcohol level is less than 5. Denies  past psychiatric history.   MEDICATIONS:  1.  Hydrochlorothiazide.  2.  Diovan.  3.  Eye drops.  4.  Cymbalta 60 mg a day but not been on this since April.   PHYSICAL AND NEUROLOGICAL EXAMINATION:  Essentially within normal limits.   ROUTINE ADMISSION LABORATORY DATA:  Within normal limits.   MENTAL STATUS EXAM:  Drowsy but alert. Appropriately groomed and dressed.  Speech slow. Mood depressed. Affect flat. Thought processes goal directed  and linear. Cognition intact. Judgment and insight fair.   ADMISSION DIAGNOSES:   AXIS I:  Major depressive disorder, recurrent, moderate.   AXIS II:  Deferred.   AXIS III:  1.  Hypertension.  2.  Glaucoma.  3.  Urinary tract infection.   AXIS IV:  Moderate problems with primary support group.   AXIS V:  35/55 to 60.   HOSPITAL COURSE:  The patient was admitted and ordered routine p.r.n.  medications and underwent further monitoring. Encouraged to participate in  individual, group, and milieu therapy. The patient was reported to have made  threats as per commitment papers and unable to contract for safety. The  patient denied suicidal ideation. Reported did not overdose but family is  concerned secondary to patient being upset with husband and positive history  of three to four attempts in the past. Brother-in-law apparently was  concerned patient  would try to leave and attempt to hurt herself again.  Therefore, the patient was monitored for safety, encouraged to participate  in individual, group, and milieu therapy. Developed healthier coping skills,  gained insight regarding her condition, and remained stabilized on  medications. The patient reported a positive response to crisis intervention  and was discharged in improved condition. Mood euthymic. Affect full.  Improved judgment, insight, and coping skills. The patient was given  medication education and discharged on Ambien 10 mg q.h.s. p.r.n., Cipro 250  q.a.m. and at 8 p.m., hydrochlorothiazide 25 mg q.a.m., Diovan 160 mg 2  q.a.m., Wellbutrin XL 300 mg q.a.m., and Travatan 0.004% ophthalmic solution  1 drop at night. The patient is to follow up with Dr. Marlyne Beards within 7 to  10 days.   DISCHARGE DIAGNOSES:   AXIS I:  Major depressive disorder, recurrent, moderate.   AXIS II:  Deferred.   AXIS III:  1.  Hypertension.  2.  Glaucoma.  3.  Urinary tract infection.   AXIS IV:  Moderate problems with primary support group.   AXIS V:  55.  Jame   JEM/MEDQ  D:  12/07/2004  T:  12/07/2004  Job:  [id number]

## 2011-06-16 ENCOUNTER — Other Ambulatory Visit (HOSPITAL_COMMUNITY): Payer: Self-pay | Admitting: Obstetrics & Gynecology

## 2011-06-16 DIAGNOSIS — Z1231 Encounter for screening mammogram for malignant neoplasm of breast: Secondary | ICD-10-CM

## 2011-08-10 ENCOUNTER — Other Ambulatory Visit: Payer: Self-pay | Admitting: Gastroenterology

## 2011-08-11 MED ORDER — OMEPRAZOLE 20 MG PO CPDR: 20.0000 mg | DELAYED_RELEASE_CAPSULE | Freq: Two times a day (BID) | ORAL | Status: DC

## 2011-08-11 NOTE — Telephone Encounter (Signed)
Medication for pt sent to [facility] in [place]. Pt needs an office appointment to follow up.  Left message for pt.

## 2011-09-01 ENCOUNTER — Ambulatory Visit (HOSPITAL_COMMUNITY)
Admission: RE | Admit: 2011-09-01 | Discharge: 2011-09-01 | Disposition: A | Discharge status: Home / Self Care | Payer: Managed Care, Other (non HMO) | Source: Ambulatory Visit | Attending: Obstetrics & Gynecology | Admitting: Obstetrics & Gynecology

## 2011-09-01 DIAGNOSIS — Z1231 Encounter for screening mammogram for malignant neoplasm of breast: Secondary | ICD-10-CM | POA: Insufficient documentation

## 2011-09-20 ENCOUNTER — Encounter: Payer: Self-pay | Admitting: Gastroenterology

## 2011-10-18 ENCOUNTER — Encounter: Payer: Self-pay | Admitting: Gastroenterology

## 2011-10-18 ENCOUNTER — Ambulatory Visit (INDEPENDENT_AMBULATORY_CARE_PROVIDER_SITE_OTHER): Payer: Managed Care, Other (non HMO) | Admitting: Gastroenterology

## 2011-10-18 VITALS — BP 136/82 | HR 68 | Ht 62.0 in | Wt 159.0 lb

## 2011-10-18 DIAGNOSIS — K219 Gastro-esophageal reflux disease without esophagitis: Secondary | ICD-10-CM

## 2011-10-18 MED ORDER — OMEPRAZOLE 20 MG PO CPDR: 20.0000 mg | DELAYED_RELEASE_CAPSULE | Freq: Every day | ORAL | Status: DC

## 2011-10-18 NOTE — Assessment & Plan Note (Signed)
Symptoms are well-controlled with omeprazole.  Plan to continue with the same. 

## 2011-10-18 NOTE — Progress Notes (Signed)
Kaitlyn [name] is a pleasant 55 year old Afro-American female with history of hypertension, depression, GERD and esophageal stricture here for a checkup exam.  On omeprezole once daily reflux symptoms are well-controlled. She denies dysphagia. She has no lower GI complaints.  Screening colonoscopy in 2011 was normal.      Past Medical History  Diagnosis Date  . Hypertension   . Hyperlipemia   . Depression   . GERD (gastroesophageal reflux disease)   . Esophageal stricture    Past Surgical History  Procedure Date  . Cholecystectomy    Current Outpatient Prescriptions  Medication Sig Dispense Refill  . amLODipine-olmesartan (AZOR) 5-40 MG per tablet Take 1 tablet by mouth daily.        Marland Kitchen amoxicillin (AMOXIL) 500 MG capsule Two tablets by mouth twice day       . aspirin 81 MG tablet Take 81 mg by mouth daily.        . bisoprolol-hydrochlorothiazide (ZIAC) 10-6.25 MG per tablet Take 1 tablet by mouth daily.        Marland Kitchen HYDROcodone-homatropine (HYCODAN) 5-1.5 MG/5ML syrup As needed      . loratadine (CLARITIN) 10 MG tablet Take 10 mg by mouth as needed.        Marland Kitchen omeprazole (PRILOSEC) 20 MG capsule Take 20 mg by mouth 2 (two) times daily. the patient does need an office appointment to follow up.       . pravastatin (PRAVACHOL) 40 MG tablet Take 40 mg by mouth daily.          reports that she has quit smoking. She has never used smokeless tobacco. She reports that she does not drink alcohol or use illicit drugs. family history is negative for Colon cancer.    Review of Systems: She currently has an resolving upper respiratory infection and has mild sinus congestion Pertinent positive and negative review of systems were noted in the above HPI section. All other review of systems were otherwise negative.  Vital signs were reviewed in today's medical record Physical Exam: General: Well developed , well nourished, no acute distress Head: Normocephalic and atraumatic Eyes:  sclerae anicteric,  EOMI Ears: Normal auditory acuity Mouth: No deformity or lesions Neck: Supple, no masses or thyromegaly Lungs: Clear throughout to auscultation Heart: Regular rate and rhythm; no murmurs, rubs or bruits Abdomen: Soft, non tender and non distended. No masses, hepatosplenomegaly or hernias noted. Normal Bowel sounds Rectal:deferred Musculoskeletal: Symmetrical with no gross deformities  Skin: No lesions on visible extremities Pulses:  Normal pulses noted Extremities: No clubbing, cyanosis, edema or deformities noted Neurological: Alert oriented x 4, grossly nonfocal Cervical Nodes:  No significant cervical adenopathy Inguinal Nodes: No significant inguinal adenopathy Psychological:  Alert and cooperative. Normal mood and affect

## 2011-10-18 NOTE — Patient Instructions (Signed)
Follow up as needed

## 2012-03-08 ENCOUNTER — Ambulatory Visit (INDEPENDENT_AMBULATORY_CARE_PROVIDER_SITE_OTHER): Payer: Managed Care, Other (non HMO) | Admitting: Family Medicine

## 2012-03-08 VITALS — BP 134/80 | HR 107 | Temp 101.5°F | Resp 20 | Ht 63.0 in | Wt 157.6 lb

## 2012-03-08 DIAGNOSIS — R05 Cough: Secondary | ICD-10-CM

## 2012-03-08 DIAGNOSIS — R509 Fever, unspecified: Secondary | ICD-10-CM

## 2012-03-08 DIAGNOSIS — J111 Influenza due to unidentified influenza virus with other respiratory manifestations: Secondary | ICD-10-CM

## 2012-03-08 LAB — POCT RAPID STREP A (OFFICE): Rapid Strep A Screen: NEGATIVE

## 2012-03-08 LAB — POCT INFLUENZA A/B
Influenza A, POC: NEGATIVE
Influenza B, POC: POSITIVE

## 2012-03-08 MED ORDER — OSELTAMIVIR PHOSPHATE 75 MG PO CAPS: 75.0000 mg | ORAL_CAPSULE | Freq: Two times a day (BID) | ORAL | Status: AC

## 2012-03-08 MED ORDER — ACETAMINOPHEN 500 MG PO TABS
1000.0000 mg | ORAL_TABLET | Freq: Four times a day (QID) | ORAL | Status: DC | PRN
  Administered 2012-03-08: 1000 mg via ORAL

## 2012-03-08 MED ORDER — HYDROCODONE-HOMATROPINE 5-1.5 MG/5ML PO SYRP: 5.0000 mL | ORAL_SOLUTION | Freq: Three times a day (TID) | ORAL | Status: AC | PRN

## 2012-03-08 NOTE — Progress Notes (Signed)
  Patient Name: [name] Date of Birth: 10-12-1956 Medical Record Number: [id number] Gender: female Date of Encounter: 03/08/2012  History of Present Illness:  Kaitlyn [name] is a 56 y.o. very pleasant female patient who presents with the following:  Went to jury duty on Tuesday- noted onset of a cough.  Also noted onset of fever, ST and body aches.  No GI symptoms. No sick contacts.   History of depression, HTN and gerd- otherwise generally healthy.    Patient Active Problem List  Diagnoses  . DEPRESSIVE DISORDER NOT ELSEWHERE CLASSIFIED  . 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 ESOPHAGEAL STRICTURE  . Esophageal Reflux   Past Medical History  Diagnosis Date  . Hypertension   . Hyperlipemia   . Depression   . GERD (gastroesophageal reflux disease)   . Esophageal stricture    Past Surgical History  Procedure Date  . Cholecystectomy    History  Substance Use Topics  . Smoking status: Former Smoker -- 0.5 packs/day for 3 years    Types: Cigarettes    Quit date: 03/08/2002  . Smokeless tobacco: Never Used  . Alcohol Use: No   Family History  Problem Relation Age of Onset  . Colon cancer Neg Hx    No Known Allergies  Medication list has been reviewed and updated.  Review of Systems: As per HPI- otherwise negative. No medications today- she took nyquil last night  Physical Examination: Filed Vitals:   03/08/12 1314  BP: 134/80  Pulse: 107  Temp: 101.5 F (38.6 C)  TempSrc: Oral  Resp: 20  Height: 5\' 3"  (1.6 m)  Weight: 157 lb 9.6 oz (71.487 kg)   Temp 99.2 recheck Body mass index is 27.92 kg/(m^2).  GEN: WDWN, NAD, Non-toxic, A & O x 3.  Appears tired HEENT: Atraumatic, Normocephalic. Neck supple. No masses, No LAD.  TM wnl, oropharynx wnl Ears and Nose: No external deformity. CV: RRR, No M/G/R. No JVD. No thrill. No extra heart sounds. PULM: CTA B, no wheezes, crackles, rhonchi. No retractions. No resp. distress. No accessory muscle use. ABD:  S, NT, ND. No rebound. No HSM. EXTR: No c/c/e NEURO Normal gait.  PSYCH: Normally interactive. Conversant. Not depressed or anxious appearing.  Calm demeanor.   Results for orders placed in visit on 03/08/12  POCT INFLUENZA A/B      Component Value Range   Influenza A, POC Negative     Influenza B, POC Positive    POCT RAPID STREP A (OFFICE)      Component Value Range   Rapid Strep A Screen Negative  Negative    Assessment and Plan: 1. Fever  acetaminophen (TYLENOL) tablet 1,000 mg, POCT Influenza A/B, POCT rapid strep A, oseltamivir (TAMIFLU) 75 MG capsule  2. Cough  POCT Influenza A/B, POCT rapid strep A, HYDROcodone-homatropine (HYCODAN) 5-1.5 MG/5ML syrup  3. Influenza  oseltamivir (TAMIFLU) 75 MG capsule   Rest, fluids. Patient (or parent if minor) instructed to return to clinic or call if not better in 3 day(s).

## 2012-10-02 ENCOUNTER — Other Ambulatory Visit (HOSPITAL_COMMUNITY): Payer: Self-pay | Admitting: Obstetrics & Gynecology

## 2012-10-02 DIAGNOSIS — Z1231 Encounter for screening mammogram for malignant neoplasm of breast: Secondary | ICD-10-CM

## 2012-10-17 ENCOUNTER — Ambulatory Visit (HOSPITAL_COMMUNITY)
Admission: RE | Admit: 2012-10-17 | Discharge: 2012-10-17 | Disposition: A | Discharge status: Home / Self Care | Payer: Managed Care, Other (non HMO) | Source: Ambulatory Visit | Attending: Obstetrics & Gynecology | Admitting: Obstetrics & Gynecology

## 2012-10-17 DIAGNOSIS — Z1231 Encounter for screening mammogram for malignant neoplasm of breast: Secondary | ICD-10-CM | POA: Insufficient documentation

## 2012-12-27 ENCOUNTER — Ambulatory Visit (HOSPITAL_COMMUNITY)
Admission: RE | Admit: 2012-12-27 | Discharge: 2012-12-27 | Disposition: A | Discharge status: Home / Self Care | Payer: Managed Care, Other (non HMO) | Source: Ambulatory Visit | Attending: Internal Medicine | Admitting: Internal Medicine

## 2012-12-27 ENCOUNTER — Other Ambulatory Visit (HOSPITAL_COMMUNITY): Payer: Self-pay | Admitting: Internal Medicine

## 2012-12-27 DIAGNOSIS — R1032 Left lower quadrant pain: Secondary | ICD-10-CM

## 2012-12-27 DIAGNOSIS — R1012 Left upper quadrant pain: Secondary | ICD-10-CM

## 2012-12-27 DIAGNOSIS — Z87891 Personal history of nicotine dependence: Secondary | ICD-10-CM | POA: Insufficient documentation

## 2012-12-27 DIAGNOSIS — K921 Melena: Secondary | ICD-10-CM | POA: Insufficient documentation

## 2012-12-27 DIAGNOSIS — R059 Cough, unspecified: Secondary | ICD-10-CM | POA: Insufficient documentation

## 2012-12-27 DIAGNOSIS — R05 Cough: Secondary | ICD-10-CM | POA: Insufficient documentation

## 2013-07-22 ENCOUNTER — Other Ambulatory Visit: Payer: Self-pay | Admitting: Emergency Medicine

## 2013-09-11 ENCOUNTER — Other Ambulatory Visit (HOSPITAL_COMMUNITY): Payer: Self-pay | Admitting: Physician Assistant

## 2013-09-11 DIAGNOSIS — Z1231 Encounter for screening mammogram for malignant neoplasm of breast: Secondary | ICD-10-CM

## 2013-10-16 ENCOUNTER — Telehealth: Payer: Self-pay | Admitting: Emergency Medicine

## 2013-10-16 DIAGNOSIS — E7800 Pure hypercholesterolemia, unspecified: Secondary | ICD-10-CM

## 2013-10-16 MED ORDER — PRAVASTATIN SODIUM 40 MG PO TABS: 40.0000 mg | ORAL_TABLET | Freq: Every day | ORAL | Status: DC

## 2013-10-16 NOTE — Telephone Encounter (Signed)
Fax refill for pravastatin sodium 40mg  tab qty30 [facility] [address] [place], [place] phone # [phone]

## 2013-10-20 ENCOUNTER — Ambulatory Visit (INDEPENDENT_AMBULATORY_CARE_PROVIDER_SITE_OTHER): Payer: Managed Care, Other (non HMO) | Admitting: Podiatry

## 2013-10-20 ENCOUNTER — Encounter: Payer: Self-pay | Admitting: Podiatry

## 2013-10-20 ENCOUNTER — Ambulatory Visit (HOSPITAL_COMMUNITY)
Admission: RE | Admit: 2013-10-20 | Discharge: 2013-10-20 | Disposition: A | Discharge status: Home / Self Care | Payer: Managed Care, Other (non HMO) | Source: Ambulatory Visit | Attending: Physician Assistant | Admitting: Physician Assistant

## 2013-10-20 ENCOUNTER — Ambulatory Visit (INDEPENDENT_AMBULATORY_CARE_PROVIDER_SITE_OTHER): Payer: Managed Care, Other (non HMO)

## 2013-10-20 VITALS — BP 144/77 | HR 89 | Resp 16 | Ht 63.0 in | Wt 157.0 lb

## 2013-10-20 DIAGNOSIS — M79609 Pain in unspecified limb: Secondary | ICD-10-CM

## 2013-10-20 DIAGNOSIS — Q6650 Congenital pes planus, unspecified foot: Secondary | ICD-10-CM

## 2013-10-20 DIAGNOSIS — M79671 Pain in right foot: Secondary | ICD-10-CM

## 2013-10-20 DIAGNOSIS — M722 Plantar fascial fibromatosis: Secondary | ICD-10-CM

## 2013-10-20 DIAGNOSIS — Z1231 Encounter for screening mammogram for malignant neoplasm of breast: Secondary | ICD-10-CM | POA: Insufficient documentation

## 2013-10-20 MED ORDER — MELOXICAM 15 MG PO TABS: 15.0000 mg | ORAL_TABLET | Freq: Every day | ORAL | Status: DC

## 2013-10-20 MED ORDER — METHYLPREDNISOLONE (PAK) 4 MG PO TABS: ORAL_TABLET | ORAL | Status: DC

## 2013-10-20 NOTE — Progress Notes (Signed)
Kaitlyn [name] is a 57 year old black female the presents today with a chief complaint of painful heels bilateral right greater than left. She is also having pain to the posterior medial aspect of the right heel were small bump is noted. She states that the non-on the medial side of the heel been there for about 2 years standing seems to bother and matrix lower states that the heel is been hurting more over the past 6 months or so. She refers to the plantar aspect of the left heel over the right heel. She has tried changing shoes over-the-counter orthotics wearing 2 pairs of socks soaking and nothing seems to help.  Objective: Vital signs are stable she is alert and oriented x3. I have reviewed her past medical history medications and allergies. Review of systems his review. Evaluation of bilateral lower extremity demonstrates: Strong palpable pulses bilateral. Capillary fill time to digits one through 5 of the bilateral foot is immediate. Neurologic sensorium is intact per since once the monofilament. Deep tendon reflexes are strong and brisk symmetrically. Muscle strength is 5 over 5 dorsiflexors plantar flexors inverters and evertors. Orthopedic evaluation demonstrates a small nonpulsatile nodule to the posterior medial aspect of the right heel that is tender on palpation over the tendo Achilles inserts on it. She also has pain on palpation medial calcaneal tubercle bilateral. No pain on medial lateral compression of the calcaneus. Radiographic evaluation does demonstrate a soft tissue increase in density at the plantar fascial calcaneal insertion site bilateral. Along with pes planus.  Assessment: Plantar fasciitis bilateral. 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 Pes planus.  Plan: We discussed etiology pathology conservative versus surgical therapies. We discussed treatment for pes planus. We injected the bilateral heels today. Plantar fascial strapping was applied  bilaterally. Dispensed a night splint right foot. Discussed appropriate shoe gear stretching exercises and ice therapy. She was given both oral and written home-going instructions. She was given a prescription for Medrol Dosepak to be followed by [facility]. I will followup with her one month. We will probably consider orthotics at that time.

## 2013-10-20 NOTE — Patient Instructions (Signed)
Plantar Fasciitis (Heel Spur Syndrome) with Rehab The plantar fascia is a fibrous, ligament-like, soft-tissue structure that spans the bottom of the foot. Plantar fasciitis is a condition that causes pain in the foot due to inflammation of the tissue. SYMPTOMS   Pain and tenderness on the underneath side of the foot.  Pain that worsens with standing or walking. CAUSES  Plantar fasciitis is caused by irritation and injury to the plantar fascia on the underneath side of the foot. Common mechanisms of injury include:  Direct trauma to bottom of the foot.  Damage to a small nerve that runs under the foot where the main fascia attaches to the heel bone.  Stress placed on the plantar fascia due to bone spurs. RISK INCREASES WITH:   Activities that place stress on the plantar fascia (running, jumping, pivoting, or cutting).  Poor strength and flexibility.  Improperly fitted shoes.  Tight calf muscles.  Flat feet.  Failure to warm-up properly before activity.  Obesity. PREVENTION  Warm up and stretch properly before activity.  Allow for adequate recovery between workouts.  Maintain physical fitness:  Strength, flexibility, and endurance.  Cardiovascular fitness.  Maintain a health body weight.  Avoid stress on the plantar fascia.  Wear properly fitted shoes, including arch supports for individuals who have flat feet. PROGNOSIS  If treated properly, then the symptoms of plantar fasciitis usually resolve without surgery. However, occasionally surgery is necessary. RELATED COMPLICATIONS   Recurrent symptoms that may result in a chronic condition.  Problems of the lower back that are caused by compensating for the injury, such as limping.  Pain or weakness of the foot during push-off following surgery.  Chronic inflammation, scarring, and partial or complete fascia tear, occurring more often from repeated injections. TREATMENT  Treatment initially involves the use of  ice and medication to help reduce pain and inflammation. The use of strengthening and stretching exercises may help reduce pain with activity, especially stretches of the Achilles tendon. These exercises may be performed at home or with a therapist. Your caregiver may recommend that you use heel cups of arch supports to help reduce stress on the plantar fascia. Occasionally, corticosteroid injections are given to reduce inflammation. If symptoms persist for greater than 6 months despite non-surgical (conservative), then surgery may be recommended.  MEDICATION   If pain medication is necessary, then nonsteroidal anti-inflammatory medications, such as aspirin and ibuprofen, or other minor pain relievers, such as acetaminophen, are often recommended.  Do not take pain medication within 7 days before surgery.  Prescription pain relievers may be given if deemed necessary by your caregiver. Use only as directed and only as much as you need.  Corticosteroid injections may be given by your caregiver. These injections should be reserved for the most serious cases, because they may only be given a certain number of times. HEAT AND COLD  Cold treatment (icing) relieves pain and reduces inflammation. Cold treatment should be applied for 10 to 15 minutes every 2 to 3 hours for inflammation and pain and immediately after any activity that aggravates your symptoms. Use ice packs or massage the area with a piece of ice (ice massage).  Heat treatment may be used prior to performing the stretching and strengthening activities prescribed by your caregiver, physical therapist, or athletic trainer. Use a heat pack or soak the injury in warm water. SEEK IMMEDIATE MEDICAL CARE IF:  Treatment seems to offer no benefit, or the condition worsens.  Any medications produce adverse side effects. EXERCISES RANGE   OF MOTION (ROM) AND STRETCHING EXERCISES - Plantar Fasciitis (Heel Spur Syndrome) These exercises may help you  when beginning to rehabilitate your injury. Your symptoms may resolve with or without further involvement from your physician, physical therapist or athletic trainer. While completing these exercises, remember:   Restoring tissue flexibility helps normal motion to return to the joints. This allows healthier, less painful movement and activity.  An effective stretch should be held for at least 30 seconds.  A stretch should never be painful. You should only feel a gentle lengthening or release in the stretched tissue. RANGE OF MOTION - Toe Extension, Flexion  Sit with your right / left leg crossed over your opposite knee.  Grasp your toes and gently pull them back toward the top of your foot. You should feel a stretch on the bottom of your toes and/or foot.  Hold this stretch for __________ seconds.  Now, gently pull your toes toward the bottom of your foot. You should feel a stretch on the top of your toes and or foot.  Hold this stretch for __________ seconds. Repeat __________ times. Complete this stretch __________ times per day.  RANGE OF MOTION - Ankle Dorsiflexion, Active Assisted  Remove shoes and sit on a chair that is preferably not on a carpeted surface.  Place right / left foot under knee. Extend your opposite leg for support.  Keeping your heel down, slide your right / left foot back toward the chair until you feel a stretch at your ankle or calf. If you do not feel a stretch, slide your bottom forward to the edge of the chair, while still keeping your heel down.  Hold this stretch for __________ seconds. Repeat __________ times. Complete this stretch __________ times per day.  STRETCH  Gastroc, Standing  Place hands on wall.  Extend right / left leg, keeping the front knee somewhat bent.  Slightly point your toes inward on your back foot.  Keeping your right / left heel on the floor and your knee straight, shift your weight toward the wall, not allowing your back to  arch.  You should feel a gentle stretch in the right / left calf. Hold this position for __________ seconds. Repeat __________ times. Complete this stretch __________ times per day. STRETCH  Soleus, Standing  Place hands on wall.  Extend right / left leg, keeping the other knee somewhat bent.  Slightly point your toes inward on your back foot.  Keep your right / left heel on the floor, bend your back knee, and slightly shift your weight over the back leg so that you feel a gentle stretch deep in your back calf.  Hold this position for __________ seconds. Repeat __________ times. Complete this stretch __________ times per day. STRETCH  Gastrocsoleus, Standing  Note: This exercise can place a lot of stress on your foot and ankle. Please complete this exercise only if specifically instructed by your caregiver.   Place the ball of your right / left foot on a step, keeping your other foot firmly on the same step.  Hold on to the wall or a rail for balance.  Slowly lift your other foot, allowing your body weight to press your heel down over the edge of the step.  You should feel a stretch in your right / left calf.  Hold this position for __________ seconds.  Repeat this exercise with a slight bend in your right / left knee. Repeat __________ times. Complete this stretch __________ times per day.    STRENGTHENING EXERCISES - Plantar Fasciitis (Heel Spur Syndrome)  These exercises may help you when beginning to rehabilitate your injury. They may resolve your symptoms with or without further involvement from your physician, physical therapist or athletic trainer. While completing these exercises, remember:   Muscles can gain both the endurance and the strength needed for everyday activities through controlled exercises.  Complete these exercises as instructed by your physician, physical therapist or athletic trainer. Progress the resistance and repetitions only as guided. STRENGTH - Towel  Curls  Sit in a chair positioned on a non-carpeted surface.  Place your foot on a towel, keeping your heel on the floor.  Pull the towel toward your heel by only curling your toes. Keep your heel on the floor.  If instructed by your physician, physical therapist or athletic trainer, add ____________________ at the end of the towel. Repeat __________ times. Complete this exercise __________ times per day. STRENGTH - Ankle Inversion  Secure one end of a rubber exercise band/tubing to a fixed object (table, pole). Loop the other end around your foot just before your toes.  Place your fists between your knees. This will focus your strengthening at your ankle.  Slowly, pull your big toe up and in, making sure the band/tubing is positioned to resist the entire motion.  Hold this position for __________ seconds.  Have your muscles resist the band/tubing as it slowly pulls your foot back to the starting position. Repeat __________ times. Complete this exercises __________ times per day.  Document Released: 11/27/2005 Document Revised: 02/19/2012 Document Reviewed: 03/11/2009 [employer] Patient Information 2014 [employer]. Plantar Fasciitis Plantar fasciitis is a common condition that causes foot pain. It is soreness (inflammation) of the band of tough fibrous tissue on the bottom of the foot that runs from the heel bone (calcaneus) to the ball of the foot. The cause of this soreness may be from excessive standing, poor fitting shoes, running on hard surfaces, being overweight, having an abnormal walk, or overuse (this is common in runners) of the painful foot or feet. It is also common in aerobic exercise dancers and ballet dancers. SYMPTOMS  Most people with plantar fasciitis complain of:  Severe pain in the morning on the bottom of their foot especially when taking the first steps out of bed. This pain recedes after a few minutes of walking.  Severe pain is experienced also during walking  following a long period of inactivity.  Pain is worse when walking barefoot or up stairs DIAGNOSIS   Your caregiver will diagnose this condition by examining and feeling your foot.  Special tests such as X-rays of your foot, are usually not needed. PREVENTION   Consult a sports medicine professional before beginning a new exercise program.  Walking programs offer a good workout. With walking there is a lower chance of overuse injuries common to runners. There is less impact and less jarring of the joints.  Begin all new exercise programs slowly. If problems or pain develop, decrease the amount of time or distance until you are at a comfortable level.  Wear good shoes and replace them regularly.  Stretch your foot and the heel cords at the back of the ankle (Achilles tendon) both before and after exercise.  Run or exercise on even surfaces that are not hard. For example, asphalt is better than pavement.  Do not run barefoot on hard surfaces.  If using a treadmill, vary the incline.  Do not continue to workout if you have foot or joint   problems. Seek professional help if they do not improve. HOME CARE INSTRUCTIONS   Avoid activities that cause you pain until you recover.  Use ice or cold packs on the problem or painful areas after working out.  Only take over-the-counter or prescription medicines for pain, discomfort, or fever as directed by your caregiver.  Soft shoe inserts or athletic shoes with air or gel sole cushions may be helpful.  If problems continue or become more severe, consult a sports medicine caregiver or your own health care provider. Cortisone is a potent anti-inflammatory medication that may be injected into the painful area. You can discuss this treatment with your caregiver. MAKE SURE YOU:   Understand these instructions.  Will watch your condition.  Will get help right away if you are not doing well or get worse. Document Released: 08/22/2001 Document  Revised: 02/19/2012 Document Reviewed: 10/21/2008 [employer] Patient Information 2014 [employer].  

## 2013-10-20 NOTE — Progress Notes (Signed)
N HUTRS L RIGHT HEEL, KNOT ON MEDIAL SIDE D 61YRS O SLOWLY C WORSE A STANDING, WALKING T CHANGE SHOES, OTC INSERTS, WEAR 2 PAIR OF SOCKS    N HURTS L LEFT HEEL D 35M O SLOWLY C WORSE A STANDING, WALKING T STRETCHING, AND SEE ABOVE TREATMENT

## 2013-10-29 ENCOUNTER — Encounter: Payer: Self-pay | Admitting: Internal Medicine

## 2013-10-29 ENCOUNTER — Other Ambulatory Visit: Payer: Self-pay | Admitting: Emergency Medicine

## 2013-10-29 DIAGNOSIS — I10 Essential (primary) hypertension: Secondary | ICD-10-CM

## 2013-10-29 DIAGNOSIS — K219 Gastro-esophageal reflux disease without esophagitis: Secondary | ICD-10-CM

## 2013-10-29 DIAGNOSIS — E559 Vitamin D deficiency, unspecified: Secondary | ICD-10-CM | POA: Insufficient documentation

## 2013-10-29 DIAGNOSIS — F419 Anxiety disorder, unspecified: Secondary | ICD-10-CM

## 2013-10-29 DIAGNOSIS — E785 Hyperlipidemia, unspecified: Secondary | ICD-10-CM | POA: Insufficient documentation

## 2013-10-30 ENCOUNTER — Ambulatory Visit: Payer: Self-pay | Admitting: Emergency Medicine

## 2013-11-03 ENCOUNTER — Encounter: Payer: Self-pay | Admitting: Emergency Medicine

## 2013-11-03 ENCOUNTER — Ambulatory Visit: Payer: Managed Care, Other (non HMO) | Admitting: Emergency Medicine

## 2013-11-03 VITALS — BP 126/70 | HR 84 | Temp 97.8°F | Resp 16 | Wt 158.0 lb

## 2013-11-03 DIAGNOSIS — F411 Generalized anxiety disorder: Secondary | ICD-10-CM

## 2013-11-03 DIAGNOSIS — R7309 Other abnormal glucose: Secondary | ICD-10-CM

## 2013-11-03 DIAGNOSIS — E782 Mixed hyperlipidemia: Secondary | ICD-10-CM

## 2013-11-03 DIAGNOSIS — R252 Cramp and spasm: Secondary | ICD-10-CM

## 2013-11-03 DIAGNOSIS — I10 Essential (primary) hypertension: Secondary | ICD-10-CM

## 2013-11-03 NOTE — Patient Instructions (Signed)
Muscle Cramps and Spasms  Muscle cramps and spasms are when muscles tighten by themselves. They usually get better within minutes. Muscle cramps are painful. They are usually stronger and last longer than muscle spasms. Muscle spasms may or may not be painful. They can last a few seconds or much longer.  HOME CARE  · Drink enough fluid to keep your pee (urine) clear or pale yellow.  · Massage, stretch, and relax the muscle.  · Use a warm towel, heating pad, or warm shower water on tight muscles.  · Place ice on the muscle if it is tender or in pain.  · Put ice in a plastic bag.  · Place a towel between your skin and the bag.  · Leave the ice on for 15-20 minutes, 03-04 times a day.  · Only take medicine as told by your doctor.  GET HELP RIGHT AWAY IF:   Your cramps or spasms get worse, happen more often, or do not get better with time.  MAKE SURE YOU:  · Understand these instructions.  · Will watch your condition.  · Will get help right away if you are not doing well or get worse.  Document Released: 11/09/2008 Document Revised: 03/24/2013 Document Reviewed: 11/13/2012  [employer]® Patient Information ©2014 [employer].

## 2013-11-04 ENCOUNTER — Telehealth: Payer: Self-pay | Admitting: *Deleted

## 2013-11-04 LAB — MAGNESIUM: Magnesium: 2 mg/dL (ref 1.5–2.5)

## 2013-11-04 LAB — HEPATIC FUNCTION PANEL
ALT: 17 U/L (ref 0–35)
AST: 14 U/L (ref 0–37)
Albumin: 4.9 g/dL (ref 3.5–5.2)
Alkaline Phosphatase: 78 U/L (ref 39–117)
Bilirubin, Direct: 0.1 mg/dL (ref 0.0–0.3)
Indirect Bilirubin: 0.2 mg/dL (ref 0.0–0.9)
Total Bilirubin: 0.3 mg/dL (ref 0.3–1.2)

## 2013-11-04 LAB — CBC WITH DIFFERENTIAL/PLATELET
Eosinophils Absolute: 0.1 10*3/uL (ref 0.0–0.7)
Eosinophils Relative: 1 % (ref 0–5)
HCT: 43.8 % (ref 36.0–46.0)
Hemoglobin: 15.1 g/dL — ABNORMAL HIGH (ref 12.0–15.0)
Lymphocytes Relative: 48 % — ABNORMAL HIGH (ref 12–46)
Lymphs Abs: 3.4 10*3/uL (ref 0.7–4.0)
MCH: 32.1 pg (ref 26.0–34.0)
MCV: 93.2 fL (ref 78.0–100.0)
Monocytes Absolute: 0.5 10*3/uL (ref 0.1–1.0)
Monocytes Relative: 6 % (ref 3–12)
RBC: 4.7 MIL/uL (ref 3.87–5.11)
WBC: 7.1 10*3/uL (ref 4.0–10.5)

## 2013-11-04 LAB — HEMOGLOBIN A1C
Hgb A1c MFr Bld: 6 % — ABNORMAL HIGH (ref <5.7)
Mean Plasma Glucose: 126 mg/dL — ABNORMAL HIGH (ref <117)

## 2013-11-04 LAB — BASIC METABOLIC PANEL WITH GFR
CO2: 30 mEq/L (ref 19–32)
Calcium: 10.7 mg/dL — ABNORMAL HIGH (ref 8.4–10.5)
Chloride: 104 mEq/L (ref 96–112)
Creat: 1.05 mg/dL (ref 0.50–1.10)
Glucose, Bld: 80 mg/dL (ref 70–99)
Sodium: 144 mEq/L (ref 135–145)

## 2013-11-04 LAB — LIPID PANEL
Cholesterol: 163 mg/dL (ref 0–200)
Total CHOL/HDL Ratio: 3 Ratio
VLDL: 40 mg/dL (ref 0–40)

## 2013-11-04 LAB — VITAMIN B12: Vitamin B-12: 647 pg/mL (ref 211–911)

## 2013-11-04 LAB — TSH: TSH: 1.501 u[IU]/mL (ref 0.350–4.500)

## 2013-11-04 LAB — IRON AND TIBC: %SAT: 30 % (ref 20–55)

## 2013-11-04 NOTE — Telephone Encounter (Signed)
Message copied by Nicholaus Corolla A on Tue Nov 04, 2013  9:54 AM ------      Message from: [place], [place] R      Created: Tue Nov 04, 2013  4:24 AM       Need to increase H2O, Trig and A1c mild elevation- need to decrease carbs, fried, ETOH ( If drinks). Magnesium low add 250 mg. Rest ok continue the same. Recheck at cpe 3/15 ------

## 2013-11-04 NOTE — Telephone Encounter (Signed)
Spoke with patient about recent lab results and instructions. 

## 2013-11-04 NOTE — Progress Notes (Signed)
Subjective:    Patient ID: Kaitlyn [name], female    DOB: [birth year], 57 y.o.   MRN: [id number]  HPI Comments: 57 YO  Female presents for 3 month F/U for HTN, Cholesterol, Pre-Dm, D. Deficient. She has been doing well overall. She has noticed increased  Anxiety over last week with  Switching to 3 rd shift 2 nights a week. She notes sleep pattern has become a little irregular. She denies any new stressors or triggers for anxiety. She has been off Xanax x 1week also because she was unsure of when to take for sleep with schedule changes.  She has been trying to improve diet. She has not been exercising regularly with foot pain and recent treatment with boot/ injections by podiatry. She has foot f/u 12/14. Her BP has been great. She has been able to manage with AZOR and HCTZ only.   She has noticed increased bad taste in mouth over last week. She denies reflux or sinus symptoms.     Hypertension Associated symptoms include anxiety.  Hyperlipidemia  Anxiety Symptoms include nervous/anxious behavior.      Current Outpatient Prescriptions on File Prior to Visit  Medication Sig Dispense Refill  . amLODipine-olmesartan (AZOR) 5-40 MG per tablet Take 1 tablet by mouth daily.        Marland Kitchen amoxicillin (AMOXIL) 500 MG capsule Two tablets by mouth twice day       . Biotin 300 MCG TABS Take by mouth daily.      . Cholecalciferol (VITAMIN D3) 2000 UNITS TABS Take by mouth daily.      Marland Kitchen HYDROCHLOROTHIAZIDE PO Take 25 mg by mouth daily.      Marland Kitchen loratadine (CLARITIN) 10 MG tablet Take 10 mg by mouth as needed.        . Magnesium 250 MG TABS Take by mouth daily.      . Omega-3 Fatty Acids (OMEGA 3 PO) Take by mouth daily.      Marland Kitchen omeprazole (PRILOSEC) 20 MG capsule Take 20 mg by mouth daily.      . pravastatin (PRAVACHOL) 40 MG tablet Take 1 tablet (40 mg total) by mouth daily.  30 tablet  3  . omeprazole (PRILOSEC) 20 MG capsule Take 1 capsule (20 mg total) by mouth daily. the patient does need an office  appointment to follow up.  30 capsule  12  . RANITIDINE HCL PO Take by mouth daily.       Current Facility-Administered Medications on File Prior to Visit  Medication Dose Route Frequency Provider Last Rate Last Dose  . acetaminophen (TYLENOL) tablet 1,000 mg  1,000 mg Oral Q6H PRN Rickard Patience, PA-C   1,000 mg at 03/08/12 1324   ALLERGIES Paxil; Ppd; Wellbutrin; Zoloft; Clinoril; and Naproxen   Review of Systems  Musculoskeletal: Positive for gait problem.  Psychiatric/Behavioral: Positive for sleep disturbance. The patient is nervous/anxious.   All other systems reviewed and are negative.    BP 126/70  Pulse 84  Temp(Src) 97.8 F (36.6 C) (Temporal)  Resp 16  Wt 158 lb (71.668 kg)     Objective:   Physical Exam  Nursing note and vitals reviewed. Constitutional: She is oriented to person, place, and time. She appears well-developed and well-nourished.  HENT:  Head: Normocephalic and atraumatic.  Right Ear: External ear normal.  Left Ear: External ear normal.  Nose: Nose normal.  Mouth/Throat: Oropharynx is clear and moist. No oropharyngeal exudate.  No odor  Eyes: Pupils are equal, round, and reactive  to light.  Neck: Normal range of motion. No thyromegaly present.  Cardiovascular: Normal rate, regular rhythm, normal heart sounds and intact distal pulses.   Pulmonary/Chest: Effort normal and breath sounds normal.  Abdominal: Soft. Bowel sounds are normal. She exhibits no mass. There is no tenderness.  Musculoskeletal: Normal range of motion.  Lymphadenopathy:    She has no cervical adenopathy.  Neurological: She is alert and oriented to person, place, and time.  Skin: Skin is warm and dry.  Psychiatric: She has a normal mood and affect.          Assessment & Plan:  1.  3 month F/U for HTN, Cholesterol, Pre-Dm, D. Deficient- check labs, increase exercise, improve diet. 2. 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  Increase protein. 3. "Bad Taste" vs Increase reflux- may be with increase anxiety, bland diet x 1 week, increase Omeprazole to BID, call with results

## 2013-11-24 ENCOUNTER — Ambulatory Visit (INDEPENDENT_AMBULATORY_CARE_PROVIDER_SITE_OTHER): Payer: Managed Care, Other (non HMO) | Admitting: Podiatry

## 2013-11-24 ENCOUNTER — Encounter: Payer: Self-pay | Admitting: Podiatry

## 2013-11-24 VITALS — BP 136/87 | HR 82 | Resp 16

## 2013-11-24 DIAGNOSIS — M722 Plantar fascial fibromatosis: Secondary | ICD-10-CM

## 2013-11-24 NOTE — Progress Notes (Signed)
   Subjective:    Patient ID: Kaitlyn [name], female    DOB: December 08, 1956, 57 y.o.   MRN: [id number]  HPI Comments: Its better , about 80-85% better      Review of Systems     Objective:   Physical Exam signs are stable she is alert and oriented x3. Pulses are strongly palpable mild tenderness on palpation medial calcaneal tubercles bilateral.         Assessment & Plan:  Assessment: Plantar fasciitis bilateral.  Plan: 85% resolution she will continue all conservative therapies medications shoe gear modifications ice therapy. She may followup with me for possible mycology.

## 2013-12-22 ENCOUNTER — Ambulatory Visit: Payer: Managed Care, Other (non HMO) | Admitting: Podiatry

## 2013-12-24 ENCOUNTER — Other Ambulatory Visit: Payer: Self-pay | Admitting: Gastroenterology

## 2013-12-29 ENCOUNTER — Ambulatory Visit (INDEPENDENT_AMBULATORY_CARE_PROVIDER_SITE_OTHER): Payer: Managed Care, Other (non HMO) | Admitting: Podiatry

## 2013-12-29 DIAGNOSIS — L600 Ingrowing nail: Secondary | ICD-10-CM

## 2013-12-29 MED ORDER — NEOMYCIN-POLYMYXIN-HC 3.5-10000-1 OT SOLN
OTIC | Status: DC
Start: 2013-12-29 — End: 2014-03-25

## 2013-12-29 NOTE — Patient Instructions (Signed)

## 2013-12-29 NOTE — Progress Notes (Signed)
The heels are 95% better , but he is to look at a ingrown toenail right great toenail medial. She states this started bothering a few days ago and elects able to do about it.  Objective: Vital signs are stable she is alert and oriented x3. Pulses are palpable bilateral. No pain on palpation medial calcaneal tubercles of the heels bilateral. She does have pain on palpation of the tibial border of the hallux right. There is erythema and edema no granulation tissue no malodor.  Assessment: Resolving plantar fasciitis bilateral. Ingrown nail hallux right.  Plan: Performed a chemical matrixectomy today after local anesthetic was used to numb the toe. 3 applications of phenol was applied to the tibial border matrix and nailbed. She tolerated procedure well and I will followup with her in one week.

## 2014-01-05 ENCOUNTER — Ambulatory Visit: Payer: Managed Care, Other (non HMO) | Admitting: Podiatry

## 2014-01-09 ENCOUNTER — Emergency Department (HOSPITAL_COMMUNITY)
Admission: EM | Admit: 2014-01-09 | Discharge: 2014-01-10 | Disposition: A | Discharge status: Home / Self Care | Payer: Managed Care, Other (non HMO) | Attending: Emergency Medicine | Admitting: Emergency Medicine

## 2014-01-09 ENCOUNTER — Encounter (HOSPITAL_COMMUNITY): Payer: Self-pay | Admitting: Emergency Medicine

## 2014-01-09 DIAGNOSIS — R45851 Suicidal ideations: Secondary | ICD-10-CM | POA: Insufficient documentation

## 2014-01-09 DIAGNOSIS — F3289 Other specified depressive episodes: Secondary | ICD-10-CM

## 2014-01-09 DIAGNOSIS — I10 Essential (primary) hypertension: Secondary | ICD-10-CM | POA: Insufficient documentation

## 2014-01-09 DIAGNOSIS — Z87891 Personal history of nicotine dependence: Secondary | ICD-10-CM | POA: Insufficient documentation

## 2014-01-09 DIAGNOSIS — E785 Hyperlipidemia, unspecified: Secondary | ICD-10-CM | POA: Insufficient documentation

## 2014-01-09 DIAGNOSIS — Z792 Long term (current) use of antibiotics: Secondary | ICD-10-CM | POA: Insufficient documentation

## 2014-01-09 DIAGNOSIS — Z79899 Other long term (current) drug therapy: Secondary | ICD-10-CM | POA: Insufficient documentation

## 2014-01-09 DIAGNOSIS — F322 Major depressive disorder, single episode, severe without psychotic features: Secondary | ICD-10-CM | POA: Insufficient documentation

## 2014-01-09 DIAGNOSIS — F329 Major depressive disorder, single episode, unspecified: Secondary | ICD-10-CM

## 2014-01-09 DIAGNOSIS — K219 Gastro-esophageal reflux disease without esophagitis: Secondary | ICD-10-CM | POA: Insufficient documentation

## 2014-01-09 DIAGNOSIS — F411 Generalized anxiety disorder: Secondary | ICD-10-CM | POA: Insufficient documentation

## 2014-01-09 LAB — CBC WITH DIFFERENTIAL/PLATELET
Basophils Absolute: 0 10*3/uL (ref 0.0–0.1)
Basophils Relative: 0 % (ref 0–1)
Eosinophils Absolute: 0 10*3/uL (ref 0.0–0.7)
Eosinophils Relative: 1 % (ref 0–5)
HEMATOCRIT: 38.9 % (ref 36.0–46.0)
Hemoglobin: 13.3 g/dL (ref 12.0–15.0)
LYMPHS ABS: 2 10*3/uL (ref 0.7–4.0)
LYMPHS PCT: 32 % (ref 12–46)
MCH: 31.4 pg (ref 26.0–34.0)
MCHC: 34.2 g/dL (ref 30.0–36.0)
MCV: 92 fL (ref 78.0–100.0)
MONO ABS: 0.3 10*3/uL (ref 0.1–1.0)
MONOS PCT: 4 % (ref 3–12)
NEUTROS ABS: 3.9 10*3/uL (ref 1.7–7.7)
Neutrophils Relative %: 63 % (ref 43–77)
Platelets: 203 10*3/uL (ref 150–400)
RBC: 4.23 MIL/uL (ref 3.87–5.11)
RDW: 12.3 % (ref 11.5–15.5)
WBC: 6.2 10*3/uL (ref 4.0–10.5)

## 2014-01-09 LAB — URINALYSIS, ROUTINE W REFLEX MICROSCOPIC
Bilirubin Urine: NEGATIVE
Glucose, UA: NEGATIVE mg/dL
Ketones, ur: NEGATIVE mg/dL
LEUKOCYTES UA: NEGATIVE
Nitrite: NEGATIVE
PH: 5.5 (ref 5.0–8.0)
Protein, ur: NEGATIVE mg/dL
Specific Gravity, Urine: 1.005 (ref 1.005–1.030)
Urobilinogen, UA: 0.2 mg/dL (ref 0.0–1.0)

## 2014-01-09 LAB — ACETAMINOPHEN LEVEL: Acetaminophen (Tylenol), Serum: <15 ug/mL (ref 10–30)

## 2014-01-09 LAB — BASIC METABOLIC PANEL
BUN: 12 mg/dL (ref 6–23)
CHLORIDE: 103 meq/L (ref 96–112)
CO2: 26 meq/L (ref 19–32)
Calcium: 10.2 mg/dL (ref 8.4–10.5)
Creatinine, Ser: 0.7 mg/dL (ref 0.50–1.10)
GFR calc Af Amer: >90 mL/min (ref >90)
GFR calc non Af Amer: >90 mL/min (ref >90)
Glucose, Bld: 111 mg/dL — ABNORMAL HIGH (ref 70–99)
POTASSIUM: 4.2 meq/L (ref 3.7–5.3)
Sodium: 142 mEq/L (ref 137–147)

## 2014-01-09 LAB — SALICYLATE LEVEL: Salicylate Lvl: <2 mg/dL — ABNORMAL LOW (ref 2.8–20.0)

## 2014-01-09 LAB — RAPID URINE DRUG SCREEN, HOSP PERFORMED
Amphetamines: NOT DETECTED
Barbiturates: NOT DETECTED
Benzodiazepines: NOT DETECTED
COCAINE: NOT DETECTED
Opiates: NOT DETECTED
Tetrahydrocannabinol: NOT DETECTED

## 2014-01-09 LAB — ETHANOL: Alcohol, Ethyl (B): <11 mg/dL (ref 0–11)

## 2014-01-09 LAB — URINE MICROSCOPIC-ADD ON

## 2014-01-09 MED ORDER — HYDROCHLOROTHIAZIDE 25 MG PO TABS
25.0000 mg | ORAL_TABLET | Freq: Every day | ORAL | Status: DC
  Administered 2014-01-09 – 2014-01-10 (×2): 25 mg via ORAL
  Filled 2014-01-09 (×2): qty 1

## 2014-01-09 MED ORDER — AMLODIPINE BESYLATE 5 MG PO TABS
5.0000 mg | ORAL_TABLET | Freq: Every day | ORAL | Status: DC
  Administered 2014-01-09 – 2014-01-10 (×2): 5 mg via ORAL
  Filled 2014-01-09 (×2): qty 1

## 2014-01-09 MED ORDER — SERTRALINE HCL 50 MG PO TABS
50.0000 mg | ORAL_TABLET | Freq: Every day | ORAL | Status: DC
  Administered 2014-01-09 – 2014-01-10 (×2): 50 mg via ORAL
  Filled 2014-01-09 (×2): qty 1

## 2014-01-09 MED ORDER — CLONIDINE HCL 0.1 MG PO TABS
0.1000 mg | ORAL_TABLET | Freq: Once | ORAL | Status: AC
  Administered 2014-01-09: 0.1 mg via ORAL
  Filled 2014-01-09: qty 1

## 2014-01-09 MED ORDER — ONDANSETRON HCL 4 MG PO TABS: 4.0000 mg | ORAL_TABLET | Freq: Three times a day (TID) | ORAL | Status: DC | PRN

## 2014-01-09 MED ORDER — TRAZODONE HCL 50 MG PO TABS: 50.0000 mg | ORAL_TABLET | Freq: Every evening | ORAL | Status: DC | PRN

## 2014-01-09 MED ORDER — IRBESARTAN 300 MG PO TABS
300.0000 mg | ORAL_TABLET | Freq: Every day | ORAL | Status: DC
  Administered 2014-01-09 – 2014-01-10 (×2): 300 mg via ORAL
  Filled 2014-01-09 (×2): qty 1

## 2014-01-09 MED ORDER — LORAZEPAM 1 MG PO TABS: 1.0000 mg | ORAL_TABLET | Freq: Three times a day (TID) | ORAL | Status: DC | PRN

## 2014-01-09 MED ORDER — ACETAMINOPHEN 325 MG PO TABS: 650.0000 mg | ORAL_TABLET | ORAL | Status: DC | PRN

## 2014-01-09 NOTE — ED Notes (Signed)
Pt from home requests psych eval. Pt reports that she was at work and stated to her coworkers that "they may not see at work tomorrow." Pt states that she has been depressed approx 8 years, since she was divorced. Pt states that she had plan of "taking pills" but denies that she wants to harm herself at this time. Pt has hx of suicidal attempts. Pt states that she has not attempted to harm herself in over 8 years. Pt is A&O and in NAD

## 2014-01-09 NOTE — ED Provider Notes (Signed)
Medical screening examination/treatment/procedure(s) were performed by non-physician practitioner and as supervising physician I was immediately available for consultation/collaboration.  EKG Interpretation   None         Layla MawKristen N Ward, DO 01/09/14 1920

## 2014-01-09 NOTE — ED Provider Notes (Signed)
CSN: [id number]     Arrival date & time 01/09/14  1434 History   First MD Initiated Contact with Patient 01/09/14 1507     Chief Complaint  Patient presents with  . Medical Clearance   (Consider location/radiation/quality/duration/timing/severity/associated sxs/prior Treatment) Patient is a 58 y.o. female presenting with mental health disorder. The history is provided by the patient. No language interpreter was used.  Mental Health Problem Presenting symptoms: depression, suicidal thoughts and suicidal threats   Patient accompanied by:  Family member Degree of incapacity (severity):  Mild Associated symptoms comment:  The patient is here with brother-in-law. Apparently, she suggested to a co-worker that she "might not be around long", who relayed this information to family. Per family member, the patient has had intermittent bouts of depression with suicidal gestures since the age of 58, with last attempt over 8 years ago. She currently says she does not think she will actually cause harm to herself.   Past Medical History  Diagnosis Date  . Hypertension   . Hyperlipemia   . Depression   . GERD (gastroesophageal reflux disease)   . Esophageal stricture   . Unspecified vitamin D deficiency   . Anxiety   . Allergy    Past Surgical History  Procedure Laterality Date  . Cholecystectomy    . Esophageal dilation  2009  . Leep  2004   Family History  Problem Relation Age of Onset  . Colon cancer Neg Hx   . Hypertension Mother   . Stroke Mother   . Hyperlipidemia Mother   . Glaucoma Mother   . Diabetes Sister   . Hypertension Sister   . Glaucoma Sister   . Hypertension Brother   . Cancer Sister 449    breast   History  Substance Use Topics  . Smoking status: Former Smoker -- 0.50 packs/day for 3 years    Types: Cigarettes    Quit date: 03/08/2002  . Smokeless tobacco: Never Used  . Alcohol Use: No   OB History   Grav Para Term Preterm Abortions TAB SAB Ect Mult Living                  Review of Systems  Constitutional: Negative for fever and chills.  HENT: Negative.   Respiratory: Negative.   Cardiovascular: Negative.   Gastrointestinal: Negative.   Musculoskeletal: Negative.   Skin: Negative.   Neurological: Negative.   Psychiatric/Behavioral: Positive for suicidal ideas and dysphoric mood.    Allergies  Ppd and Clinoril  Home Medications   Current Outpatient Rx  Name  Route  Sig  Dispense  Refill  . amLODipine-olmesartan (AZOR) 5-40 MG per tablet   Oral   Take 1 tablet by mouth at bedtime.          . Biotin 300 MCG TABS   Oral   Take 1 tablet by mouth at bedtime.          . Cholecalciferol (VITAMIN D3) 2000 UNITS TABS   Oral   Take 2 tablets by mouth at bedtime.          . cloNIDine (CATAPRES) 0.1 MG tablet   Oral   Take 0.1 mg by mouth at bedtime.          . hydrochlorothiazide (HYDRODIURIL) 25 MG tablet   Oral   Take 25 mg by mouth at bedtime. Blood pressure         . loratadine (CLARITIN) 10 MG tablet   Oral   Take 10 mg by  mouth daily as needed for allergies.          . Magnesium 250 MG TABS   Oral   Take 1 tablet by mouth daily.          Marland Kitchen neomycin-polymyxin-hydrocortisone (CORTISPORIN) otic solution      1-2 drops to the toe after soaking twice daily   10 mL   1   . Omega-3 Fatty Acids (OMEGA 3 PO)   Oral   Take 1 capsule by mouth 2 (two) times daily.          Marland Kitchen omeprazole (PRILOSEC) 20 MG capsule   Oral   Take 20 mg by mouth at bedtime. Acid reflex         . pravastatin (PRAVACHOL) 40 MG tablet   Oral   Take 1 tablet (40 mg total) by mouth daily.   30 tablet   3    BP 178/87  Pulse 98  Temp(Src) 98.4 F (36.9 C) (Oral)  Resp 18  SpO2 99% Physical Exam  Constitutional: She is oriented to person, place, and time. She appears well-developed and well-nourished.  HENT:  Head: Normocephalic.  Neck: Normal range of motion. Neck supple.  Cardiovascular: Normal rate and regular  rhythm.   Pulmonary/Chest: Effort normal and breath sounds normal.  Abdominal: Soft. Bowel sounds are normal. There is no tenderness. There is no rebound and no guarding.  Musculoskeletal: Normal range of motion.  Neurological: She is alert and oriented to person, place, and time.  Skin: Skin is warm and dry. No rash noted.  Psychiatric: She has a normal mood and affect.    ED Course  Procedures (including critical care time) Labs Review Labs Reviewed - No data to display Imaging Review No results found.  EKG Interpretation   None       MDM  No diagnosis found. 1. Depression 2. Suicidal ideation  Discussed with [facility] assessment team, who feels the patient would benefit from overnight observation given history of suicidal attempt and her current condition. Plan will be to keep her in psych ED overnight and be reassessed by psychiatrist in the morning. Per [facility], patient is agreeable.   Arnoldo Hooker, PA-C 01/09/14 1739

## 2014-01-09 NOTE — Consult Note (Signed)
[facility] Face-to-Face Psychiatry Consult   Reason for Consult:  DEPRESSION AND SUICIDAL THOUGHTS Referring Physician:  EDP Kaitlyn [name] is an 58 y.o. female. Total Time spent with patient: 45 minutes  Assessment: AXIS I:  Major depressive d/o, recurrent, suicidal thoughts AXIS II:  Deferred AXIS III:   Past Medical History  Diagnosis Date  . Hypertension   . Hyperlipemia   . Depression   . GERD (gastroesophageal reflux disease)   . Esophageal stricture   . Unspecified vitamin D deficiency   . Anxiety   . Allergy    AXIS IV:  occupational problems, other psychosocial or environmental problems, problems related to social environment and problems with primary support group AXIS V:  41-50 serious symptoms  Plan:  Patient does not meet criteria for psychiatric inpatient admission. We will like to observe overnight and start antidepressant for her  Subjective:   Kaitlyn [name] is a 58 y.o. female patient evaluated for Major depression and Suicidal thoughts.  HPI:  AA female who was brought in by his brother in law for evaluation of depression and suicidal thoughts.  This is the first time this patient is seeking Psychiatric help at this ER but she used to get her depression medications from her primary care doctor.  Patient stopped taking any antidepressants a long time ago.  She states she only takes Xanax for hot flashes ordered by her primary care provider.  Patient states she has been divorced for 8 years and she has been suffering from depression since then.  Patient states her stressors includes not getting along with another female at her job.  Patient states the thought of seeing her co-worker at work makes her angry and more depressed.  She states she feels like hurting herself to avoid going to work and seeing her.  Patient also states she is lonely and does not have children.  She also states she does not communicate with her sisters and the only support she has is her brother in  Social worker.  Patient reports her means of hurting herself is by drug OD which she has done in the past.  She also states she does not want to go through "somebody pumping my stomach if I  OD on my pills".  She reports poor sleep which she attributes to hot flashes. She reports good appetite.  Patient states she isolates self and watches TV at home.  When this writer suggested patient establish a contact with a therapist and Psychiatrist, she replied" I do not want to pay hospital or clinic bills, I do not have money"  She will be kept in our [facility] over night for observation.  We will start antidepressant and Trazodone for sleep tonight.  Patient denies SI/HI/AVH during the interview and she stated her deterrent from hurting herself is her poppy. HPI Elements:   Location:  mood. Quality:  severe to the extent of contemplating suicide, stopping work. Severity:  severe to the extent of isolating self, thinking of hurting self. Timing:  has been going on for 8 years after her divorce. Duration:  8 years ago after her divorce. Context:  divorce, stress at work.  Past Psychiatric History: Past Medical History  Diagnosis Date  . Hypertension   . Hyperlipemia   . Depression   . GERD (gastroesophageal reflux disease)   . Esophageal stricture   . Unspecified vitamin D deficiency   . Anxiety   . Allergy     reports that she quit smoking about 11  years ago. Her smoking use included Cigarettes. She has a 1.5 pack-year smoking history. She has never used smokeless tobacco. She reports that she does not drink alcohol or use illicit drugs. Family History  Problem Relation Age of Onset  . Colon cancer Neg Hx   . Hypertension Mother   . Stroke Mother   . Hyperlipidemia Mother   . Glaucoma Mother   . Diabetes Sister   . Hypertension Sister   . Glaucoma Sister   . Hypertension Brother   . Cancer Sister 1849    breast           Allergies:   Allergies  Allergen Reactions  . Ppd [Tuberculin Purified Protein  Derivative]     + PPD with NEG CXR 1/ 2014  . Clinoril [Sulindac] Rash    ACT Assessment Complete:  No:   Past Psychiatric History: Diagnosis:  Major depressive d/o, recurrent  Hospitalizations:  none  Outpatient Care:  none  Substance Abuse Care:  none  Self-Mutilation:  none  Suicidal Attempts:  Yes x 1  Homicidal Behaviors: none   Violent Behaviors:  denies   [place] Marital Status:  divorced Employed/Unemployed:  employed Education:  unknown Family Supports:  no Objective: Blood pressure 178/87, pulse 98, temperature 98.4 F (36.9 C), temperature source Oral, resp. rate 18, SpO2 99.00%.There is no weight on file to calculate BMI.No results found for this or any previous visit (from the past 72 hour(s)). Labs are reviewed and are pertinent for Reviewed all labs and they are unremarkable.. A1C IS 6.0 .Patient will be encouraged to repeat test in 3 months to evaluate her metabolic status.  Current Facility-Administered Medications  Medication Dose Route Frequency Provider Last Rate Last Dose  . acetaminophen (TYLENOL) tablet 1,000 mg  1,000 mg Oral Q6H PRN Rickard PatienceSarah E Nelson, PA-C   1,000 mg at 03/08/12 1324   Current Outpatient Prescriptions  Medication Sig Dispense Refill  . amLODipine-olmesartan (AZOR) 5-40 MG per tablet Take 1 tablet by mouth at bedtime.       . Biotin 300 MCG TABS Take 1 tablet by mouth at bedtime.       . Cholecalciferol (VITAMIN D3) 2000 UNITS TABS Take 2 tablets by mouth at bedtime.       . cloNIDine (CATAPRES) 0.1 MG tablet Take 0.1 mg by mouth at bedtime.       . hydrochlorothiazide (HYDRODIURIL) 25 MG tablet Take 25 mg by mouth at bedtime. Blood pressure      . loratadine (CLARITIN) 10 MG tablet Take 10 mg by mouth daily as needed for allergies.       . Magnesium 250 MG TABS Take 1 tablet by mouth daily.       Marland Kitchen. neomycin-polymyxin-hydrocortisone (CORTISPORIN) otic solution 1-2 drops to the toe after soaking twice daily  10 mL  1   . Omega-3 Fatty Acids (OMEGA 3 PO) Take 1 capsule by mouth 2 (two) times daily.       Marland Kitchen. omeprazole (PRILOSEC) 20 MG capsule Take 20 mg by mouth at bedtime. Acid reflex      . pravastatin (PRAVACHOL) 40 MG tablet Take 1 tablet (40 mg total) by mouth daily.  30 tablet  3   Physical examination by EDP on 01/09/2014 are unremarkable.  Psychiatric Specialty Exam:     Blood pressure 178/87, pulse 98, temperature 98.4 F (36.9 C), temperature source Oral, resp. rate 18, SpO2 99.00%.There is no weight on file to calculate BMI.  General Appearance:  Fairly Groomed  Patent attorney::  Good  Speech:  Clear and Coherent and Normal Rate  Volume:  Normal  Mood:  Depressed, Hopeless and Worthless  Affect:  Congruent, Depressed and Flat  Thought Process:  Coherent, Goal Directed and Intact  Orientation:  Full (Time, Place, and Person)  Thought Content:  NA  Suicidal Thoughts:  Yes.  without intent/plan  Homicidal Thoughts:  No  Memory:  Immediate;   Good Recent;   Good Remote;   Good  Judgement:  Poor  Insight:  Good  Psychomotor Activity:  Normal  Concentration:  Good  Recall:  NA  Fund of Knowledge:Good  Language: Good  Akathisia:  NA  Handed:  Right  AIMS (if indicated):     Assets:  Desire for Improvement  Sleep:      Musculoskeletal: Strength & Muscle Tone: within normal limits Gait & Station: normal Patient leans: N/A  Treatment Plan Summary: Consult with Dr Gilmore Laroche We will keep patient overnight for safety and observation We will start Zoloft 50 mg po daily We will start patient on Trazodone 50 mg po daily Daily contact with patient to assess and evaluate symptoms and progress in treatment Medication management Keep overnight and will reevaluate in am by the Psychiatrist and this writer  Dahlia Byes, C   PMHNP-BC 01/09/2014 4:14 PM   I agreed with the findings, treatment and disposition plan of this patient. Will keep under observation in regard to her depression and  disposition.

## 2014-01-09 NOTE — ED Notes (Signed)
Patient ambulatory with a steady gait to restroom. No acute distress noted.

## 2014-01-10 DIAGNOSIS — F339 Major depressive disorder, recurrent, unspecified: Secondary | ICD-10-CM

## 2014-01-10 MED ORDER — TRAZODONE HCL 50 MG PO TABS: 50.0000 mg | ORAL_TABLET | Freq: Every day | ORAL | Status: DC

## 2014-01-10 MED ORDER — SERTRALINE HCL 50 MG PO TABS: 50.0000 mg | ORAL_TABLET | Freq: Every day | ORAL | Status: DC

## 2014-01-10 NOTE — Progress Notes (Signed)
Patient ID: Clement HusbandsLinda C Cipollone, female   DOB: 07/23/1956, 58 y.o.   MRN: [id number] Psychiatric Specialty Exam: Physical Exam  ROS  Blood pressure 142/73, pulse 79, temperature 97.7 F (36.5 C), temperature source Oral, resp. rate 18, SpO2 99.00%.There is no weight on file to calculate BMI.  General Appearance: Casual and Fairly Groomed  Patent attorneyye Contact::  Good  Speech:  Clear and Coherent and Normal Rate  Volume:  Normal  Mood:  Depressed  Affect:  Depressed  Thought Process:  Coherent, Goal Directed and Intact  Orientation:  Full (Time, Place, and Person)  Thought Content:  NA  Suicidal Thoughts:  No  Homicidal Thoughts:  No  Memory:  Immediate;   Good Recent;   Good Remote;   Good  Judgement:  Fair  Insight:  Good and Fair  Psychomotor Activity:  Normal  Concentration:  Good  Recall:  NA  Akathisia:  NA  Handed:  Right  AIMS (if indicated):     Assets:  Desire for Improvement  Sleep:      DIAGNOSIS: Major Depressive d/o, Recurrent  Patient was seen on rounds this am with Dt Solomia Harrell. Patient reports sleeping much better last night although the light bothered her.  This am she denies SI/HI/AVH.  Patient was asked how she will cope with work stressors and she stated" ignore my co-worker who annoys me"  This Clinical research associatewriter spent time going over different coping strategies to use when stressed out including working things out with her co-worker.  Patient was encouraged to establish an outpatient therapist and Psychiatrist for continuity of care.  We discussed need to continue taking her Zoloft to manage her depression and hot flashes.  She was encouraged to look for activities in the community to participate in when she is not working. Patient verbalizes understanding and she repeated her deterrence for not killing her self is her love for her Poppy.  We will give her information on [facility].  We will give her prescription for Zoloft and Trazodone for  depression and sleep respectively.  Plan: Discharge home.  Dahlia ByesJosephine Onuoha   PMHNP-BC  Patient seen face to face this morning and case discussed with physician extender and reviewed the information documented and agree with the treatment plan.  Estil Vallee,JANARDHAHA R. 01/10/2014 4:07 PM

## 2014-01-10 NOTE — [facility] Suicide Risk Assessment (Cosign Needed)
Suicide Risk Assessment  Discharge Assessment     Demographic Factors:  Divorced or widowed, Living alone and AA female  Total Time spent with patient: 30 minutes  Psychiatric Specialty Exam:     Blood pressure 142/73, pulse 79, temperature 97.7 F (36.5 C), temperature source Oral, resp. rate 18, SpO2 99.00%.There is no weight on file to calculate BMI.  General Appearance: Casual and Fairly Groomed  Patent attorneyye Contact::  Good  Speech:  Clear and Coherent and Normal Rate  Volume:  Normal  Mood:  Depressed  Affect:  Congruent and Depressed  Thought Process:  Coherent and Goal Directed  Orientation:  Full (Time, Place, and Person)  Thought Content:  NA  Suicidal Thoughts:  No  Homicidal Thoughts:  No  Memory:  Immediate;   Good Recent;   Good Remote;   Good  Judgement:  Fair  Insight:  Good and Fair  Psychomotor Activity:  Normal  Concentration:  Good  Recall:  NA  Fund of Knowledge:Good  Language: Fluent in [place] language  Akathisia:  NA  Handed:  Right  AIMS (if indicated):     Assets:  Desire for Improvement  Sleep:       Musculoskeletal: Strength & Muscle Tone: within normal limits Gait & Station: normal Patient leans: N/A   Mental Status Per Nursing Assessment::   On Admission:     Current Mental Status by Physician: Self-harm thoughts- NONE TODAY  Loss Factors:  Previous divorce 8 years ago stress at work  Historical Factors: Previous OD 10 years ago  Risk Reduction Factors:   Employed, Positive therapeutic relationship and Love for her Poppy she calls her baby  Continued Clinical Symptoms:  Depression:   Anhedonia Insomnia  Cognitive Features That Contribute To Risk:  Closed-mindedness    Suicide Risk:  None at this time, discussed coping strategy with patient, resumed antdepressant Minimal: No identifiable suicidal ideation.  Patients presenting with no risk factors but with morbid ruminations; may be classified as minimal risk based on the  severity of the depressive symptoms  Discharge Diagnoses:   AXIS I:  MAJOR DEPRESSIVE D/O, RECURRENT AXIS II:  Deferred AXIS III:   Past Medical History  Diagnosis Date  . Hypertension   . Hyperlipemia   . Depression   . GERD (gastroesophageal reflux disease)   . Esophageal stricture   . Unspecified vitamin D deficiency   . Anxiety   . Allergy    AXIS IV:  other psychosocial or environmental problems and problems related to social environment AXIS V:  61-70 mild symptoms  Plan Of Care/Follow-up recommendations:  Activity:  As tolerated Diet:  Regular  Is patient on multiple antipsychotic therapies at discharge:  No   Has Patient had three or more failed trials of antipsychotic monotherapy by history:  No  Recommended Plan for Multiple Antipsychotic Therapies:  no NA    Dahlia ByesONUOHA, Michaelina Blandino, C   PMHNP-BC 01/10/2014, 12:44 PM

## 2014-01-10 NOTE — Discharge Instructions (Signed)
Follow up with [facility] out patient [facility]

## 2014-01-16 ENCOUNTER — Ambulatory Visit (INDEPENDENT_AMBULATORY_CARE_PROVIDER_SITE_OTHER): Payer: 59 | Admitting: Licensed Clinical Social Worker

## 2014-01-16 DIAGNOSIS — F331 Major depressive disorder, recurrent, moderate: Secondary | ICD-10-CM

## 2014-01-19 ENCOUNTER — Encounter: Payer: Self-pay | Admitting: Emergency Medicine

## 2014-01-19 ENCOUNTER — Ambulatory Visit (INDEPENDENT_AMBULATORY_CARE_PROVIDER_SITE_OTHER): Payer: Managed Care, Other (non HMO) | Admitting: Emergency Medicine

## 2014-01-19 VITALS — BP 116/70 | HR 68 | Temp 98.0°F | Resp 16 | Ht 63.0 in | Wt 155.0 lb

## 2014-01-19 DIAGNOSIS — F3289 Other specified depressive episodes: Secondary | ICD-10-CM

## 2014-01-19 DIAGNOSIS — F32A Depression, unspecified: Secondary | ICD-10-CM

## 2014-01-19 DIAGNOSIS — F4323 Adjustment disorder with mixed anxiety and depressed mood: Secondary | ICD-10-CM

## 2014-01-19 DIAGNOSIS — R5381 Other malaise: Secondary | ICD-10-CM

## 2014-01-19 DIAGNOSIS — F329 Major depressive disorder, single episode, unspecified: Secondary | ICD-10-CM

## 2014-01-19 DIAGNOSIS — R5383 Other fatigue: Secondary | ICD-10-CM

## 2014-01-19 NOTE — Progress Notes (Signed)
Subjective:    Patient ID: Kaitlyn HusbandsLinda C [name], female    DOB: 11/01/1956, 58 y.o.   MRN: [id number]  HPI Comments: 58 yo f/u for depression/ anxiety with recent ER visit. SHE HAS BEEN OUT OF WORK SINCE LAST WEEK WITH OVERNIGHT STAY and evaluation for depression at ER. She notes all of the new RX are making her very tired and hard to wake up. She is still stressed with work and does not feel that she can return due to fatigue and anxiety. She notes does not want to go out or be around crowds. She feels overwhelmed and fatigued.     Medication List       This list is accurate as of: 01/19/14 11:58 AM.  Always use your most recent med list.               aspirin 81 MG tablet  Take 81 mg by mouth daily.     AZOR 5-40 MG per tablet  Generic drug:  amLODipine-olmesartan  Take 1 tablet by mouth at bedtime.     Biotin 300 MCG Tabs  Take 1 tablet by mouth at bedtime.     cloNIDine 0.1 MG tablet  Commonly known as:  CATAPRES  Take 0.1 mg by mouth at bedtime.     hydrochlorothiazide 25 MG tablet  Commonly known as:  HYDRODIURIL  Take 25 mg by mouth at bedtime. Blood pressure     loratadine 10 MG tablet  Commonly known as:  CLARITIN  Take 10 mg by mouth daily as needed for allergies.     Magnesium 250 MG Tabs  Take 1 tablet by mouth daily.     meloxicam 15 MG tablet  Commonly known as:  MOBIC  Take 15 mg by mouth daily.     neomycin-polymyxin-hydrocortisone otic solution  Commonly known as:  CORTISPORIN  1-2 drops to the toe after soaking twice daily     OMEGA 3 PO  Take 1 capsule by mouth 2 (two) times daily.     omeprazole 20 MG capsule  Commonly known as:  PRILOSEC  Take 20 mg by mouth at bedtime. Acid reflex     pravastatin 40 MG tablet  Commonly known as:  PRAVACHOL  Take 1 tablet (40 mg total) by mouth daily.     sertraline 50 MG tablet  Commonly known as:  ZOLOFT  Take 1 tablet (50 mg total) by mouth daily.     traZODone 50 MG tablet  Commonly known as:   DESYREL  Take 1 tablet (50 mg total) by mouth at bedtime.     Vitamin D3 2000 UNITS Tabs  Take 2 tablets by mouth at bedtime.       Allergies  Allergen Reactions  . 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 Clinoril [Sulindac] Rash   Past Medical History  Diagnosis Date  . Hypertension   . Hyperlipemia   . Depression   . GERD (gastroesophageal reflux disease)   . Esophageal stricture   . Unspecified vitamin D deficiency   . Anxiety   . Allergy       Review of Systems  Constitutional: Positive for fatigue.  Psychiatric/Behavioral: Positive for sleep disturbance and agitation. The patient is nervous/anxious.   All other systems reviewed and are negative.   BP 116/70  Pulse 68  Temp(Src) 98 F (36.7 C) (Temporal)  Resp 16  Ht 5\' 3"  (1.6 m)  Wt 155  lb (70.308 kg)  BMI 27.46 kg/m2     Objective:   Physical Exam  Nursing note and vitals reviewed. Constitutional: She is oriented to person, place, and time. She appears well-developed and well-nourished.  HENT:  Head: Normocephalic and atraumatic.  Right Ear: External ear normal.  Left Ear: External ear normal.  Nose: Nose normal.  Mouth/Throat: Oropharynx is clear and moist.  Eyes: Conjunctivae and EOM are normal.  Neck: Normal range of motion.  Cardiovascular: Normal rate, regular rhythm, normal heart sounds and intact distal pulses.   Pulmonary/Chest: Effort normal and breath sounds normal.  Musculoskeletal: Normal range of motion.  Lymphadenopathy:    She has no cervical adenopathy.  Neurological: She is alert and oriented to person, place, and time.  Skin: Skin is warm and dry.  Psychiatric: She has a normal mood and affect. Her behavior is normal. Judgment and thought content normal.  Mildly anxious          Assessment & Plan:  OOW 2-4 more weeks and cover starting last Saturday 01/10/14  1. Anxiety/ depression/ Fatigue-reviewed ER labs, increase activity and  H2O, decrease Trazodone to 1/2 x 2 nights then stop medication and call with results. May need to add mood stablizer. w/c if SX increase or ER.

## 2014-01-19 NOTE — Patient Instructions (Signed)
Mood Disorders decrease Trazodone to 1/2 x 2 nights then stop medication and call with results. May need to add mood stablizer. SWITCH ZOLOFT to night time.  Mood disorders are conditions that affect the way a person feels emotionally. The main mood disorders include:  Depression.  Bipolar disorder.  Dysthymia. Dysthymia is a mild, lasting (chronic) depression. Symptoms of dysthymia are similar to depression, but not as severe.  Cyclothymia. Cyclothymia includes mood swings, but the highs and lows are not as severe as they are in bipolar disorder. Symptoms of cyclothymia are similar to those of bipolar disorder, but less extreme. CAUSES  Mood disorders are probably caused by a combination of factors. People with mood disorders seem to have physical and chemical changes in their brains. Mood disorders run in families, so there may be genetic causes. Severe trauma or stressful life events may also increase the risk of mood disorders.  SYMPTOMS  Symptoms of mood disorders depend on the specific type of condition. Depression symptoms include:  Feeling sad, worthless, or hopeless.  Negative thoughts.  Inability to enjoy one's usual activities.  Low energy.  Sleeping too much or too little.  Appetite changes.  Crying.  Concentration problems.  Thoughts of harming oneself. Bipolar disorder symptoms include:  Periods of depression (see above symptoms).  Mood swings, from sadness and depression, to abnormal elation and excitement.  Periods of mania:  Racing thoughts.  Fast speech.  Poor judgment, and careless, dangerous choices.  Decreased need for sleep.  Risky behavior.  Difficulty concentrating.  Irritability.  Increased energy.  Increased sex drive. DIAGNOSIS  There are no blood tests or X-rays that can confirm a mood disorder. However, your caregiver may choose to run some tests to make sure that there is not another physical cause for your symptoms. A mood  disorder is usually diagnosed after an in-depth interview with a caregiver. TREATMENT  Mood disorders can be treated with one or more of the following:  Medicine. This may include antidepressants, mood-stabilizers, or anti-psychotics.  Psychotherapy (talk therapy).  Cognitive behavioral therapy. You are taught to recognize negative thoughts and behavior patterns, and replace them with healthy thoughts and behaviors.  Electroconvulsive therapy. For very severe cases of deep depression, a series of treatments in which an electrical current is applied to the brain.  Vagus nerve stimulation. A pulse of electricity is applied to a portion of the brain.  Transcranial magnetic stimulation. Powerful magnets are placed on the head that produce electrical currents.  Hospitalization. In severe situations, or when someone is having serious thoughts of harming him or herself, hospitalization may be necessary in order to keep the person safe. This is also done to quickly start and monitor treatment. HOME CARE INSTRUCTIONS   Take your medicine exactly as directed.  Attend all of your therapy sessions.  Try to eat regular, healthy meals.  Exercise daily. Exercise may improve mood symptoms.  Get good sleep.  Do not drink alcohol or use pot or other drugs. These can worsen mood symptoms and cause anxiety and psychosis.  Tell your caregiver if you develop any side effects, such as feeling sick to your stomach (nauseous), dry mouth, dizziness, constipation, drowsiness, tremor, weight gain, or sexual symptoms. He or she may suggest things you can do to improve symptoms.  Learn ways to cope with the stress of having a chronic illness. This includes yoga, meditation, tai chi, or participating in a support group.  Drink enough water to keep your urine clear or pale yellow.  Eat a high-fiber diet. These habits may help you avoid constipation from your medicine. SEEK IMMEDIATE MEDICAL CARE IF:  Your mood  worsens.  You have thoughts of hurting yourself or others.  You cannot care for yourself.  You develop the sensation of hearing or seeing something that is not actually present (auditory or visual hallucinations).  You develop abnormal thoughts. Document Released: 09/24/2009 Document Revised: 02/19/2012 Document Reviewed: 09/24/2009 [facility] Patient Information 2014 [place], [place].

## 2014-01-26 ENCOUNTER — Ambulatory Visit (INDEPENDENT_AMBULATORY_CARE_PROVIDER_SITE_OTHER): Payer: 59 | Admitting: Licensed Clinical Social Worker

## 2014-01-26 DIAGNOSIS — F331 Major depressive disorder, recurrent, moderate: Secondary | ICD-10-CM

## 2014-01-27 ENCOUNTER — Ambulatory Visit: Payer: Managed Care, Other (non HMO) | Admitting: Podiatry

## 2014-01-29 ENCOUNTER — Ambulatory Visit (INDEPENDENT_AMBULATORY_CARE_PROVIDER_SITE_OTHER): Payer: Managed Care, Other (non HMO) | Admitting: Podiatry

## 2014-01-29 VITALS — BP 112/62 | HR 78 | Resp 16 | Ht 63.0 in | Wt 150.0 lb

## 2014-01-29 DIAGNOSIS — L600 Ingrowing nail: Secondary | ICD-10-CM

## 2014-01-29 MED ORDER — MELOXICAM 15 MG PO TABS: 15.0000 mg | ORAL_TABLET | Freq: Every day | ORAL | Status: DC

## 2014-01-29 NOTE — Progress Notes (Signed)
She presents today for followup of a matrixectomy hallux right she denies fever chills nausea vomiting muscle aches pains continues to soak foot on a daily basis.  Objective: Vital signs are stable she is alert and oriented x3. There is no erythema edema saline is drainage or odor.  Assessment: Well-healing surgical toe hallux right.  Plan: Discontinue the use of Betadine start with Epsom salts and water soaks continue to soak and to completely resolved.

## 2014-01-30 ENCOUNTER — Ambulatory Visit: Payer: 59 | Admitting: Licensed Clinical Social Worker

## 2014-02-04 ENCOUNTER — Telehealth: Payer: Self-pay | Admitting: Internal Medicine

## 2014-02-04 ENCOUNTER — Other Ambulatory Visit: Payer: Self-pay | Admitting: Emergency Medicine

## 2014-02-04 MED ORDER — SERTRALINE HCL 50 MG PO TABS: 50.0000 mg | ORAL_TABLET | Freq: Every day | ORAL | Status: DC

## 2014-02-04 NOTE — Telephone Encounter (Signed)
Patient called to see if she is able to go back to work yet? Patient also requesting renewal of Zoloft 50mg  1 daily. [facility] [place],[facility]. Thanks, Lady Garykatrina

## 2014-02-04 NOTE — Telephone Encounter (Signed)
Pt called and asked that work start 02-11-14

## 2014-02-04 NOTE — Telephone Encounter (Signed)
Ask her does she feel ok to go back to work in March? I received 2 different notes on return to work.

## 2014-02-10 ENCOUNTER — Ambulatory Visit: Payer: Managed Care, Other (non HMO) | Admitting: Podiatry

## 2014-02-23 ENCOUNTER — Encounter: Payer: Self-pay | Admitting: Emergency Medicine

## 2014-03-10 ENCOUNTER — Other Ambulatory Visit: Payer: Self-pay | Admitting: Emergency Medicine

## 2014-03-12 ENCOUNTER — Ambulatory Visit: Payer: Managed Care, Other (non HMO) | Admitting: Podiatry

## 2014-03-25 ENCOUNTER — Encounter: Payer: Self-pay | Admitting: Internal Medicine

## 2014-03-25 ENCOUNTER — Ambulatory Visit (INDEPENDENT_AMBULATORY_CARE_PROVIDER_SITE_OTHER): Payer: Managed Care, Other (non HMO) | Admitting: Internal Medicine

## 2014-03-25 VITALS — BP 126/80 | HR 72 | Temp 97.7°F | Resp 16 | Ht 63.0 in | Wt 158.8 lb

## 2014-03-25 DIAGNOSIS — J329 Chronic sinusitis, unspecified: Secondary | ICD-10-CM

## 2014-03-25 MED ORDER — PREDNISONE 20 MG PO TABS: 20.0000 mg | ORAL_TABLET | ORAL | Status: DC

## 2014-03-25 MED ORDER — HYDROCODONE-ACETAMINOPHEN 5-325 MG PO TABS: ORAL_TABLET | ORAL | Status: AC

## 2014-03-25 MED ORDER — AZITHROMYCIN 250 MG PO TABS: ORAL_TABLET | ORAL | Status: DC

## 2014-03-25 NOTE — Patient Instructions (Signed)

## 2014-03-25 NOTE — Progress Notes (Signed)
Subjective:    Patient ID: Kaitlyn [name], female    DOB: 10/15/1956, 58 y.o.   MRN: [id number]  Sinusitis This is a new problem. The current episode started in the past 7 days. The problem has been waxing and waning since onset. There has been no fever. The pain is mild. Associated symptoms include chills, congestion, coughing, ear pain, headaches, sinus pressure, sneezing and a sore throat. Pertinent negatives include no diaphoresis, hoarse voice, neck pain, shortness of breath or swollen glands. Past treatments include nothing.   Current Outpatient Prescriptions on File Prior to Visit  Medication Sig Dispense Refill  . amLODipine-olmesartan (AZOR) 5-40 MG per tablet Take 1 tablet by mouth at bedtime.       Marland Kitchen. aspirin 81 MG tablet Take 81 mg by mouth daily.      . Biotin 300 MCG TABS Take 1 tablet by mouth at bedtime.       . Cholecalciferol (VITAMIN D3) 2000 UNITS TABS Take 2 tablets by mouth at bedtime.       . cloNIDine (CATAPRES) 0.1 MG tablet Take 0.1 mg by mouth at bedtime.       . hydrochlorothiazide (HYDRODIURIL) 25 MG tablet Take 25 mg by mouth at bedtime. Blood pressure      . loratadine (CLARITIN) 10 MG tablet Take 10 mg by mouth daily as needed for allergies.       . Magnesium 250 MG TABS Take 1 tablet by mouth daily.       . meloxicam (MOBIC) 15 MG tablet Take 1 tablet (15 mg total) by mouth daily.  30 tablet  3  . Omega-3 Fatty Acids (OMEGA 3 PO) Take 1 capsule by mouth 2 (two) times daily.       Marland Kitchen. omeprazole (PRILOSEC) 20 MG capsule Take 20 mg by mouth at bedtime. Acid reflex      . pravastatin (PRAVACHOL) 40 MG tablet Take 1 tablet (40 mg total) by mouth daily.  30 tablet  3  . sertraline (ZOLOFT) 50 MG tablet TAKE 1 TABLET (50 MG TOTAL) BY MOUTH DAILY.  90 tablet  0   No current facility-administered medications on file prior to visit.   Allergies  Allergen Reactions  . 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 Clinoril [Sulindac]  Rash   Past Medical History  Diagnosis Date  . Hypertension   . Hyperlipemia   . Depression   . GERD (gastroesophageal reflux disease)   . Esophageal stricture   . Unspecified vitamin D deficiency   . Anxiety   . Allergy       Review of Systems  Constitutional: Positive for chills. Negative for fever and diaphoresis.  HENT: Positive for congestion, ear pain, rhinorrhea, sinus pressure, sneezing and sore throat. Negative for dental problem, drooling, ear discharge, facial swelling, hearing loss, hoarse voice, mouth sores, nosebleeds, postnasal drip, tinnitus, trouble swallowing and voice change.   Eyes: Positive for photophobia, discharge and itching. Negative for pain, redness and visual disturbance.  Respiratory: Positive for cough. Negative for shortness of breath.   Cardiovascular: Negative.   Gastrointestinal: Negative.   Genitourinary: Negative.   Musculoskeletal: Negative for neck pain.  Neurological: Positive for headaches.   BP 126/80  Pulse 72  Temp(Src) 97.7 F (36.5 C) (Temporal)  Resp 16  Ht 5\' 3"  (1.6 m)  Wt 158 lb 12.8 oz (72.031 kg)  BMI 28.14 kg/m2  Objective:   Physical Exam  Constitutional:  She is oriented to person, place, and time. She appears well-nourished.  HENT:  Nose: Nose normal.  Mouth/Throat: Oropharynx is clear and moist. No oropharyngeal exudate.  Bilateral  Frontal and Maxillary sinus tenderness  Eyes: Conjunctivae are normal. Pupils are equal, round, and reactive to light. Right eye exhibits no discharge. Left eye exhibits no discharge. No scleral icterus.  Neck: Normal range of motion. Neck supple. No thyromegaly present.  Cardiovascular: Normal rate, regular rhythm and normal heart sounds.   No murmur heard. Pulmonary/Chest: Effort normal and breath sounds normal. No respiratory distress. She has no wheezes. She has no rales.  Lymphadenopathy:    She has no cervical adenopathy.  Neurological: She is alert and oriented to person,  place, and time. No cranial nerve deficit. Coordination normal.  Skin: Skin is warm and dry. No rash noted. She is not diaphoretic. No erythema.    Assessment & Plan:   1. Rhinosinusitis  - predniSONE (DELTASONE) 20 MG tablet; Take 1 tablet (20 mg total) by mouth See admin instructions. 1 tab 3 x day for 3 days, then 1 tab 2 x day for 3 days, then 1 tab 1 x day for 5 days  Dispense: 20 tablet; Refill: 0 - azithromycin (ZITHROMAX) 250 MG tablet; Take 2 tablets (500 mg) on  Day 1,  followed by 1 tablet (250 mg) once daily on Days 2 through 5.  Dispense: 6 each; Refill: 1 - HYDROcodone-acetaminophen (NORCO) 5-325 MG per tablet; Take 1/2 to 1 tablet every 3 to 4 hours as need for cough or pain  Dispense: 30 tablet; Refill: 0

## 2014-03-30 DIAGNOSIS — Z0000 Encounter for general adult medical examination without abnormal findings: Secondary | ICD-10-CM

## 2014-04-02 ENCOUNTER — Other Ambulatory Visit: Payer: Self-pay | Admitting: Internal Medicine

## 2014-04-02 ENCOUNTER — Telehealth: Payer: Self-pay

## 2014-04-02 ENCOUNTER — Other Ambulatory Visit: Payer: Self-pay | Admitting: *Deleted

## 2014-04-02 MED ORDER — AMLODIPINE-OLMESARTAN 5-40 MG PO TABS: 1.0000 | ORAL_TABLET | Freq: Every day | ORAL | Status: DC

## 2014-04-02 NOTE — Telephone Encounter (Signed)
Pt called requesting Rx for Azor. Pt could not remember dosage. HT-[place].

## 2014-04-03 ENCOUNTER — Other Ambulatory Visit: Payer: Self-pay | Admitting: Emergency Medicine

## 2014-04-03 MED ORDER — AMLODIPINE-OLMESARTAN 5-40 MG PO TABS: 1.0000 | ORAL_TABLET | Freq: Every day | ORAL | Status: DC

## 2014-05-12 ENCOUNTER — Encounter: Payer: Self-pay | Admitting: Emergency Medicine

## 2014-05-12 ENCOUNTER — Ambulatory Visit (INDEPENDENT_AMBULATORY_CARE_PROVIDER_SITE_OTHER): Payer: Managed Care, Other (non HMO) | Admitting: Emergency Medicine

## 2014-05-12 VITALS — BP 142/86 | HR 78 | Temp 98.4°F | Resp 18 | Ht 63.0 in | Wt 158.0 lb

## 2014-05-12 DIAGNOSIS — Z7901 Long term (current) use of anticoagulants: Secondary | ICD-10-CM

## 2014-05-12 DIAGNOSIS — Z1212 Encounter for screening for malignant neoplasm of rectum: Secondary | ICD-10-CM

## 2014-05-12 DIAGNOSIS — J309 Allergic rhinitis, unspecified: Secondary | ICD-10-CM

## 2014-05-12 DIAGNOSIS — M25569 Pain in unspecified knee: Secondary | ICD-10-CM

## 2014-05-12 DIAGNOSIS — I10 Essential (primary) hypertension: Secondary | ICD-10-CM

## 2014-05-12 DIAGNOSIS — E782 Mixed hyperlipidemia: Secondary | ICD-10-CM

## 2014-05-12 DIAGNOSIS — Z79899 Other long term (current) drug therapy: Secondary | ICD-10-CM

## 2014-05-12 DIAGNOSIS — Z0000 Encounter for general adult medical examination without abnormal findings: Secondary | ICD-10-CM

## 2014-05-12 LAB — CBC WITH DIFFERENTIAL/PLATELET
BASOS PCT: 0 % (ref 0–1)
Basophils Absolute: 0 10*3/uL (ref 0.0–0.1)
EOS PCT: 3 % (ref 0–5)
Eosinophils Absolute: 0.1 10*3/uL (ref 0.0–0.7)
HCT: 40.7 % (ref 36.0–46.0)
HEMOGLOBIN: 14.1 g/dL (ref 12.0–15.0)
Lymphocytes Relative: 42 % (ref 12–46)
Lymphs Abs: 1.9 10*3/uL (ref 0.7–4.0)
MCH: 32 pg (ref 26.0–34.0)
MCHC: 34.6 g/dL (ref 30.0–36.0)
MCV: 92.3 fL (ref 78.0–100.0)
MONO ABS: 0.4 10*3/uL (ref 0.1–1.0)
MONOS PCT: 8 % (ref 3–12)
NEUTROS ABS: 2.1 10*3/uL (ref 1.7–7.7)
Neutrophils Relative %: 47 % (ref 43–77)
Platelets: 222 10*3/uL (ref 150–400)
RBC: 4.41 MIL/uL (ref 3.87–5.11)
RDW: 14 % (ref 11.5–15.5)
WBC: 4.5 10*3/uL (ref 4.0–10.5)

## 2014-05-12 MED ORDER — AMLODIPINE-OLMESARTAN 5-40 MG PO TABS: 1.0000 | ORAL_TABLET | Freq: Every day | ORAL | Status: DC

## 2014-05-12 NOTE — Progress Notes (Signed)
Subjective:    Patient ID: Kaitlyn HusbandsLinda C [name], female    DOB: 06/21/1956, 58 y.o.   MRN: [id number]  HPI Comments: 58 yo pleasant [employer] CPE and presents for 3 month F/U for HTN, Cholesterol, Pre-Dm, D. deficient . She is doing well overall and feels much better on Zoloft. She notes stress has improved.  SHe notes allergies/ cough improved after last visit with treatment but have restarted. She notes production is clear.   She notes right shoulder pain radiates into back. She denies strain/ injury. She did not try stretches AD.   She is exercising more and eating better. She has started herbal life with protein bars. 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 She notes socks have helped with pain.   She had EGD 2009 with dilation of stricture and she has noted increased REFLUX and heartburn over last couple weeks.   WBC             6.2   01/09/2014 HGB            13.3   01/09/2014 HCT            38.9   01/09/2014 PLT             203   01/09/2014 GLUCOSE         111   01/09/2014 CHOL            163   11/03/2013 TRIG            198   11/03/2013 HDL              55   11/03/2013 LDLCALC          68   11/03/2013 ALT              17   11/03/2013 AST              14   11/03/2013 NA              142   01/09/2014 K               4.2   01/09/2014 CL              103   01/09/2014 CREATININE     0.70   01/09/2014 BUN              12   01/09/2014 CO2              26   01/09/2014 TSH           1.501   11/03/2013 HGBA1C          6.0   11/03/2013    Hyperlipidemia Associated symptoms include myalgias.  Hypertension     Medication List       This list is accurate as of: 05/12/14 10:02 PM.  Always use your most recent med list.               amLODipine-olmesartan 5-40 MG per tablet  Commonly known as:  AZOR  Take 1 tablet by mouth at bedtime.     aspirin 81 MG tablet  Take 81 mg by mouth daily.     Biotin 300 MCG Tabs  Take 1 tablet by mouth at bedtime.     cloNIDine 0.1 MG tablet  Commonly known as:  CATAPRES  Take 0.1 mg  by mouth at bedtime.     EVENING PRIMROSE OIL PO  Take by mouth daily.     hydrochlorothiazide 25 MG tablet  Commonly known as:  HYDRODIURIL  Take 25 mg by mouth at bedtime. Blood pressure     loratadine 10 MG tablet  Commonly known as:  CLARITIN  Take 10 mg by mouth daily as needed for allergies.     Magnesium 250 MG Tabs  Take 1 tablet by mouth 2 (two) times daily.     meloxicam 15 MG tablet  Commonly known as:  MOBIC  Take 1 tablet (15 mg total) by mouth daily.     OMEGA 3 PO  Take 1 capsule by mouth 2 (two) times daily.     omeprazole 20 MG capsule  Commonly known as:  PRILOSEC  Take 20 mg by mouth at bedtime. Acid reflex     pravastatin 40 MG tablet  Commonly known as:  PRAVACHOL  TAKE 1 TABLET (40 MG TOTAL) BY MOUTH DAILY.     sertraline 50 MG tablet  Commonly known as:  ZOLOFT  TAKE 1 TABLET (50 MG TOTAL) BY MOUTH DAILY.     Vitamin D3 2000 UNITS Tabs  Take 2 tablets by mouth at bedtime.       Allergies  Allergen Reactions  . 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 Clinoril [Sulindac] Rash   Past Medical History  Diagnosis Date  . Hypertension   . Hyperlipemia   . Depression   . GERD (gastroesophageal reflux disease)   . Esophageal stricture   . Unspecified vitamin D deficiency   . Anxiety   . Allergy    Past Surgical History  Procedure Laterality Date  . Cholecystectomy    . Esophageal dilation  2009  . Leep  2004   History  Substance Use Topics  . Smoking status: Former Smoker -- 0.50 packs/day for 3 years    Types: Cigarettes    Quit date: 03/08/2002  . Smokeless tobacco: Never Used  . Alcohol Use: No   Family History  Problem Relation Age of Onset  . Colon cancer Neg Hx   . Hypertension Mother   . Stroke Mother   . Hyperlipidemia Mother   . Glaucoma Mother   .  Diabetes Sister   . Hypertension Sister   . Glaucoma Sister   . Hypertension Brother   . Cancer Sister 79    breast   MAINTENANCE: EGD: 2009 with stricture dilation Colonoscopy:3/ 2011 Mammo:11/101/2 BMD:01/20/10 WNL Pap/ Pelvic: 2013 wnl [id number] WNL/ glasses Dentist: q 6 month  IMMUNIZATIONS: Td:2007 Pneumovax: 2010 Zostavax:n/a Influenza:2014  Patient Care Team: Lucky Cowboy, MD as PCP - General (Internal Medicine) Louis Meckel, MD as Consulting Physician (Gastroenterology) Robley Fries, MD as Consulting Physician (Obstetrics and Gynecology) Nadara Mustard, MD as Consulting Physician (Orthopedic Surgery) Ladona Ridgel, (Dentist) [facility], (EYE)   Review of Systems  HENT: Positive for congestion and postnasal drip.   Cardiovascular: Positive for leg swelling.  Gastrointestinal:       GERD  Musculoskeletal: Positive for arthralgias and myalgias.  Neurological: Positive for dizziness.  All other systems reviewed and are negative.  BP 142/86  Pulse 78  Temp(Src) 98.4 F (36.9 C) (Temporal)  Resp 18  Ht 5\' 3"  (1.6 m)  Wt 158 lb (71.668 kg)  BMI 28.00 kg/m2     Objective:   Physical Exam  Nursing note and vitals reviewed. Constitutional:  She is oriented to person, place, and time. She appears well-developed and well-nourished. No distress.  HENT:  Head: Normocephalic and atraumatic.  Right Ear: External ear normal.  Left Ear: External ear normal.  Nose: Nose normal.  Mouth/Throat: Oropharynx is clear and moist. No oropharyngeal exudate.  Cloudy TM's bilaterally   Eyes: Conjunctivae and EOM are normal. Pupils are equal, round, and reactive to light. Right eye exhibits no discharge. Left eye exhibits no discharge. No scleral icterus.  Neck: Normal range of motion. Neck supple. No JVD present. No tracheal deviation present. No thyromegaly present.  Cardiovascular: Normal rate, regular rhythm, normal heart sounds and intact distal pulses.     Pulmonary/Chest: Effort normal and breath sounds normal.  Abdominal: Soft. Bowel sounds are normal. She exhibits no distension and no mass. There is no tenderness. There is no rebound and no guarding.  Genitourinary:  Ref gyn  Musculoskeletal: Normal range of motion. She exhibits tenderness. She exhibits no edema.       Arms: Lymphadenopathy:    She has no cervical adenopathy.  Neurological: She is alert and oriented to person, place, and time. She has normal reflexes. No cranial nerve deficit. She exhibits normal muscle tone. Coordination normal.  Skin: Skin is warm and dry. No rash noted. No erythema. No pallor.  Right great toe nail with mild fungus  Psychiatric: She has a normal mood and affect. Her behavior is normal. Judgment and thought content normal.      AORTA SCAN WNL EKG NSCSPT     Assessment & Plan:  1. CPE- Update screening labs/ History/ Immunizations/ Testing as needed. Advised healthy diet, QD exercise, increase H20 and continue RX/ Vitamins AD.  2. 3 month F/U for HTN, Cholesterol, Pre-Dm, D. Deficient. Needs healthy diet, cardio QD and obtain healthy weight. Check Labs, Check BP if >130/80 call office  3. Allergic rhinitis vs vertigo in the shower- Allegra OTC, increase H2o, allergy hygiene explained. Dymista NS sx#1  4. Right shoulder vs neck pain- stretches explained, neck roll pillow advised if no change needs x-ray  5. Leg pain/ edema- continue stocking AD, elevate legs TID, increase activity, check labs  6. GERD- Diet/ hygiene discussed, needs re-eval at GI with stricture hx

## 2014-05-12 NOTE — Patient Instructions (Signed)
Allergic Rhinitis Allergic rhinitis is when the mucous membranes in the nose respond to allergens. Allergens are particles in the air that cause your body to have an allergic reaction. This causes you to release allergic antibodies. Through a chain of events, these eventually cause you to release histamine into the blood stream. Although meant to protect the body, it is this release of histamine that causes your discomfort, such as frequent sneezing, congestion, and an itchy, runny nose.  CAUSES  Seasonal allergic rhinitis (hay fever) is caused by pollen allergens that may come from grasses, trees, and weeds. Year-round allergic rhinitis (perennial allergic rhinitis) is caused by allergens such as house dust mites, pet dander, and mold spores.  SYMPTOMS   Nasal stuffiness (congestion).  Itchy, runny nose with sneezing and tearing of the eyes. DIAGNOSIS  Your health care provider can help you determine the allergen or allergens that trigger your symptoms. If you and your health care provider are unable to determine the allergen, skin or blood testing may be used. TREATMENT  Allergic Rhinitis does not have a cure, but it can be controlled by:  Medicines and allergy shots (immunotherapy).  Avoiding the allergen. Hay fever may often be treated with antihistamines in pill or nasal spray forms. Antihistamines block the effects of histamine. There are over-the-counter medicines that may help with nasal congestion and swelling around the eyes. Check with your health care provider before taking or giving this medicine.  If avoiding the allergen or the medicine prescribed do not work, there are many new medicines your health care provider can prescribe. Stronger medicine may be used if initial measures are ineffective. Desensitizing injections can be used if medicine and avoidance does not work. Desensitization is when a patient is given ongoing shots until the body becomes less sensitive to the allergen.  Make sure you follow up with your health care provider if problems continue. HOME CARE INSTRUCTIONS It is not possible to completely avoid allergens, but you can reduce your symptoms by taking steps to limit your exposure to them. It helps to know exactly what you are allergic to so that you can avoid your specific triggers. SEEK MEDICAL CARE IF:   You have a fever.  You develop a cough that does not stop easily (persistent).  You have shortness of breath.  You start wheezing.  Symptoms interfere with normal daily activities. Document Released: 08/22/2001 Document Revised: 09/17/2013 Document Reviewed: 08/04/2013 [employer] Patient Information 2014 [employer].  

## 2014-05-13 ENCOUNTER — Telehealth: Payer: Self-pay | Admitting: *Deleted

## 2014-05-13 LAB — MAGNESIUM: MAGNESIUM: 1.9 mg/dL (ref 1.5–2.5)

## 2014-05-13 LAB — IRON AND TIBC
%SAT: 22 % (ref 20–55)
IRON: 65 ug/dL (ref 42–145)
TIBC: 301 ug/dL (ref 250–470)
UIBC: 236 ug/dL (ref 125–400)

## 2014-05-13 LAB — LIPID PANEL
CHOL/HDL RATIO: 4 ratio
CHOLESTEROL: 218 mg/dL — AB (ref 0–200)
HDL: 54 mg/dL (ref >39)
LDL Cholesterol: 127 mg/dL — ABNORMAL HIGH (ref 0–99)
Triglycerides: 185 mg/dL — ABNORMAL HIGH (ref <150)
VLDL: 37 mg/dL (ref 0–40)

## 2014-05-13 LAB — HEPATIC FUNCTION PANEL
ALBUMIN: 4.5 g/dL (ref 3.5–5.2)
ALT: 18 U/L (ref 0–35)
AST: 20 U/L (ref 0–37)
Alkaline Phosphatase: 80 U/L (ref 39–117)
BILIRUBIN TOTAL: 0.3 mg/dL (ref 0.2–1.2)
Total Protein: 6.8 g/dL (ref 6.0–8.3)

## 2014-05-13 LAB — MICROALBUMIN / CREATININE URINE RATIO
Creatinine, Urine: 226.1 mg/dL
Microalb Creat Ratio: 7.7 mg/g (ref 0.0–30.0)
Microalb, Ur: 1.73 mg/dL (ref 0.00–1.89)

## 2014-05-13 LAB — URINALYSIS, ROUTINE W REFLEX MICROSCOPIC
BILIRUBIN URINE: NEGATIVE
Glucose, UA: NEGATIVE mg/dL
Hgb urine dipstick: NEGATIVE
KETONES UR: NEGATIVE mg/dL
Leukocytes, UA: NEGATIVE
NITRITE: NEGATIVE
PH: 6.5 (ref 5.0–8.0)
Protein, ur: NEGATIVE mg/dL
Specific Gravity, Urine: 1.027 (ref 1.005–1.030)
Urobilinogen, UA: 0.2 mg/dL (ref 0.0–1.0)

## 2014-05-13 LAB — BASIC METABOLIC PANEL WITH GFR
BUN: 20 mg/dL (ref 6–23)
CALCIUM: 10.1 mg/dL (ref 8.4–10.5)
CO2: 26 mEq/L (ref 19–32)
CREATININE: 0.8 mg/dL (ref 0.50–1.10)
Chloride: 105 mEq/L (ref 96–112)
GFR, Est Non African American: 83 mL/min
GLUCOSE: 85 mg/dL (ref 70–99)
Potassium: 3.6 mEq/L (ref 3.5–5.3)
Sodium: 142 mEq/L (ref 135–145)

## 2014-05-13 LAB — VITAMIN D 25 HYDROXY (VIT D DEFICIENCY, FRACTURES): VIT D 25 HYDROXY: 89 ng/mL (ref 30–89)

## 2014-05-13 LAB — VITAMIN B12: VITAMIN B 12: 772 pg/mL (ref 211–911)

## 2014-05-13 LAB — HEMOGLOBIN A1C
Hgb A1c MFr Bld: 6.1 % — ABNORMAL HIGH (ref <5.7)
MEAN PLASMA GLUCOSE: 128 mg/dL — AB (ref <117)

## 2014-05-13 LAB — INSULIN, FASTING: Insulin fasting, serum: 11 u[IU]/mL (ref 3–28)

## 2014-05-13 LAB — TSH: TSH: 1.016 u[IU]/mL (ref 0.350–4.500)

## 2014-05-14 NOTE — Telephone Encounter (Signed)
Unable to reach patient after multiple attempts.  Mailed her a copy of lab results with instructions.

## 2014-05-26 ENCOUNTER — Encounter: Payer: Self-pay | Admitting: Gastroenterology

## 2014-06-19 ENCOUNTER — Other Ambulatory Visit: Payer: Self-pay | Admitting: Emergency Medicine

## 2014-07-08 ENCOUNTER — Other Ambulatory Visit: Payer: Self-pay | Admitting: Podiatry

## 2014-07-08 ENCOUNTER — Other Ambulatory Visit: Payer: Self-pay | Admitting: Emergency Medicine

## 2014-07-09 ENCOUNTER — Other Ambulatory Visit: Payer: Self-pay | Admitting: Emergency Medicine

## 2014-07-15 ENCOUNTER — Encounter: Payer: Self-pay | Admitting: Gastroenterology

## 2014-08-04 ENCOUNTER — Encounter: Payer: Self-pay | Admitting: Gastroenterology

## 2014-08-04 ENCOUNTER — Ambulatory Visit (INDEPENDENT_AMBULATORY_CARE_PROVIDER_SITE_OTHER): Payer: Managed Care, Other (non HMO) | Admitting: Gastroenterology

## 2014-08-04 ENCOUNTER — Other Ambulatory Visit: Payer: Self-pay | Admitting: Emergency Medicine

## 2014-08-04 VITALS — BP 152/74 | HR 84 | Ht 63.0 in | Wt 155.8 lb

## 2014-08-04 DIAGNOSIS — K222 Esophageal obstruction: Secondary | ICD-10-CM

## 2014-08-04 DIAGNOSIS — Z1231 Encounter for screening mammogram for malignant neoplasm of breast: Secondary | ICD-10-CM

## 2014-08-04 DIAGNOSIS — K219 Gastro-esophageal reflux disease without esophagitis: Secondary | ICD-10-CM

## 2014-08-04 MED ORDER — OMEPRAZOLE-SODIUM BICARBONATE 40-1100 MG PO CAPS: ORAL_CAPSULE | ORAL | Status: DC

## 2014-08-04 NOTE — Assessment & Plan Note (Addendum)
Patient is symptomatic, especially with nocturnal GERD.    Recommendations #1 trial of Zegerid 40 mg each bedtime #2 antireflux measures

## 2014-08-04 NOTE — Assessment & Plan Note (Signed)
Dysphagia very likely is secondary to a recurrent stricture.    Recommendations #1 upper endoscopy with dilation as indicated

## 2014-08-04 NOTE — Patient Instructions (Signed)
You have been scheduled for an endoscopy. Please follow written instructions given to you at your visit today. If you use inhalers (even only as needed), please bring them with you on the day of your procedure. Your physician has requested that you go to www.startemmi.com and enter the access code given to you at your visit today. This web site gives a general overview about your procedure. However, you should still follow specific instructions given to you by our office regarding your preparation for the procedure.  Stop [employer] Zegerid  Gastroesophageal Reflux Disease, Adult Gastroesophageal reflux disease (GERD) happens when acid from your stomach flows up into the esophagus. When acid comes in contact with the esophagus, the acid causes soreness (inflammation) in the esophagus. Over time, GERD may create small holes (ulcers) in the lining of the esophagus. CAUSES   Increased body weight. This puts pressure on the stomach, making acid rise from the stomach into the esophagus.  Smoking. This increases acid production in the stomach.  Drinking alcohol. This causes decreased pressure in the lower esophageal sphincter (valve or ring of muscle between the esophagus and stomach), allowing acid from the stomach into the esophagus.  Late evening meals and a full stomach. This increases pressure and acid production in the stomach.  A malformed lower esophageal sphincter. Sometimes, no cause is found. SYMPTOMS   Burning pain in the lower part of the mid-chest behind the breastbone and in the mid-stomach area. This may occur twice a week or more often.  Trouble swallowing.  Sore throat.  Dry cough.  Asthma-like symptoms including chest tightness, shortness of breath, or wheezing. DIAGNOSIS  Your caregiver may be able to diagnose GERD based on your symptoms. In some cases, X-rays and other tests may be done to check for complications or to check the condition of your stomach and  esophagus. TREATMENT  Your caregiver may recommend over-the-counter or prescription medicines to help decrease acid production. Ask your caregiver before starting or adding any new medicines.  HOME CARE INSTRUCTIONS   Change the factors that you can control. Ask your caregiver for guidance concerning weight loss, quitting smoking, and alcohol consumption.  Avoid foods and drinks that make your symptoms worse, such as:  Caffeine or alcoholic drinks.  Chocolate.  Peppermint or mint flavorings.  Garlic and onions.  Spicy foods.  Citrus fruits, such as oranges, lemons, or limes.  Tomato-based foods such as sauce, chili, salsa, and pizza.  Fried and fatty foods.  Avoid lying down for the 3 hours prior to your bedtime or prior to taking a nap.  Eat small, frequent meals instead of large meals.  Wear loose-fitting clothing. Do not wear anything tight around your waist that causes pressure on your stomach.  Raise the head of your bed 6 to 8 inches with wood blocks to help you sleep. Extra pillows will not help.  Only take over-the-counter or prescription medicines for pain, discomfort, or fever as directed by your caregiver.  Do not take aspirin, ibuprofen, or other nonsteroidal anti-inflammatory drugs (NSAIDs). SEEK IMMEDIATE MEDICAL CARE IF:   You have pain in your arms, neck, jaw, teeth, or back.  Your pain increases or changes in intensity or duration.  You develop nausea, vomiting, or sweating (diaphoresis).  You develop shortness of breath, or you faint.  Your vomit is green, yellow, black, or looks like coffee grounds or blood.  Your stool is red, bloody, or black. These symptoms could be signs of other problems, such as heart disease,  gastric bleeding, or esophageal bleeding. MAKE SURE YOU:   Understand these instructions.  Will watch your condition.  Will get help right away if you are not doing well or get worse. Document Released: 09/06/2005 Document  Revised: 02/19/2012 Document Reviewed: 06/16/2011 [facility] Patient Information 2015 [place], [place]. This information is not intended to replace advice given to you by your health care provider. Make sure you discuss any questions you have with your health care provider.

## 2014-08-04 NOTE — Addendum Note (Signed)
Addended by: Marlowe Kays on: 08/04/2014 04:17 PM   Modules accepted: Orders

## 2014-08-04 NOTE — Progress Notes (Signed)
_                                                                                                                History of Present Illness:  Kaitlyn [name] is a 58 year old Afro-American female with history of esophageal stricture referred for evaluation of dysphagia and pyrosis.  She was last dilated in 2009.  Despite taking omeprazole she has frequent pyrosis, especially at night.  She recently has been taking Mobic for foot pain.  She has dysphagia to solids.   Past Medical History  Diagnosis Date  . Hypertension   . Hyperlipemia   . Depression   . GERD (gastroesophageal reflux disease)   . Esophageal stricture   . Unspecified vitamin D deficiency   . Anxiety   . Allergy    Past Surgical History  Procedure Laterality Date  . Cholecystectomy    . Esophageal dilation  2009  . Leep  2004   family history includes Cancer (age of onset: 79) in her sister; Diabetes in her sister; Glaucoma in her mother and sister; Hyperlipidemia in her mother; Hypertension in her brother, mother, and sister; Stroke in her mother. There is no history of Colon cancer. Current Outpatient Prescriptions  Medication Sig Dispense Refill  . amLODipine-olmesartan (AZOR) 5-40 MG per tablet Take 1 tablet by mouth at bedtime.  90 tablet  1  . aspirin 81 MG tablet Take 81 mg by mouth daily.      . Biotin 300 MCG TABS Take 1 tablet by mouth at bedtime.       . Cholecalciferol (VITAMIN D3) 2000 UNITS TABS Take 2 tablets by mouth at bedtime.       . cloNIDine (CATAPRES) 0.1 MG tablet Take 0.1 mg by mouth at bedtime.       Marland Kitchen EVENING PRIMROSE OIL PO Take by mouth daily.      . hydrochlorothiazide (HYDRODIURIL) 25 MG tablet TAKE ONE TABLET BY MOUTH DAILY  90 tablet  1  . loratadine (CLARITIN) 10 MG tablet Take 10 mg by mouth daily as needed for allergies.       . Magnesium 250 MG TABS Take 1 tablet by mouth 2 (two) times daily.       . meloxicam (MOBIC) 15 MG tablet TAKE 1 TABLET (15 MG TOTAL) BY MOUTH  DAILY.  30 tablet  2  . Omega-3 Fatty Acids (OMEGA 3 PO) Take 1 capsule by mouth 2 (two) times daily.       Marland Kitchen omeprazole (PRILOSEC) 20 MG capsule Take 20 mg by mouth at bedtime. Acid reflex      . pravastatin (PRAVACHOL) 40 MG tablet TAKE 1 TABLET (40 MG TOTAL) BY MOUTH DAILY.  30 tablet  2  . sertraline (ZOLOFT) 50 MG tablet TAKE 1 TABLET BY MOUTH DAILY.  90 tablet  1   No current facility-administered medications for this visit.   Allergies as of 08/04/2014 - Review Complete 08/04/2014  Allergen Reaction Noted  . Ppd [tuberculin purified protein derivative]  10/29/2013  .  Clinoril [sulindac] Rash 10/29/2013    reports that she quit smoking about 12 years ago. Her smoking use included Cigarettes. She has a 1.5 pack-year smoking history. She has never used smokeless tobacco. She reports that she does not drink alcohol or use illicit drugs.   Review of Systems: Pertinent positive and negative review of systems were noted in the above HPI section. All other review of systems were otherwise negative.  Vital signs were reviewed in today's medical record Physical Exam: General: Well developed , well nourished, no acute distress Skin: anicteric Head: Normocephalic and atraumatic Eyes:  sclerae anicteric, EOMI Ears: Normal auditory acuity Mouth: No deformity or lesions Neck: Supple, no masses or thyromegaly Lungs: Clear throughout to auscultation Heart: Regular rate and rhythm; no murmurs, rubs or bruits Abdomen: Soft, non tender and non distended. No masses, hepatosplenomegaly or hernias noted. Normal Bowel sounds Rectal:deferred Musculoskeletal: Symmetrical with no gross deformities  Skin: No lesions on visible extremities Pulses:  Normal pulses noted Extremities: No clubbing, cyanosis, edema or deformities noted Neurological: Alert oriented x 4, grossly nonfocal Cervical Nodes:  No significant cervical adenopathy Inguinal Nodes: No significant inguinal adenopathy Psychological:   Alert and cooperative. Normal mood and affect  See Assessment and Plan under Problem List

## 2014-08-12 ENCOUNTER — Encounter: Payer: Self-pay | Admitting: Gastroenterology

## 2014-08-12 ENCOUNTER — Ambulatory Visit: Payer: Self-pay | Admitting: Physician Assistant

## 2014-08-19 ENCOUNTER — Encounter: Payer: Self-pay | Admitting: Gastroenterology

## 2014-09-01 ENCOUNTER — Other Ambulatory Visit: Payer: Self-pay | Admitting: *Deleted

## 2014-09-01 MED ORDER — PRAVASTATIN SODIUM 40 MG PO TABS: ORAL_TABLET | ORAL | Status: DC

## 2014-09-18 ENCOUNTER — Encounter: Payer: Self-pay | Admitting: Gastroenterology

## 2014-09-23 ENCOUNTER — Encounter (INDEPENDENT_AMBULATORY_CARE_PROVIDER_SITE_OTHER): Payer: Self-pay

## 2014-09-23 ENCOUNTER — Encounter: Payer: Self-pay | Admitting: Physician Assistant

## 2014-09-23 ENCOUNTER — Ambulatory Visit (INDEPENDENT_AMBULATORY_CARE_PROVIDER_SITE_OTHER): Payer: Managed Care, Other (non HMO) | Admitting: Physician Assistant

## 2014-09-23 VITALS — BP 122/62 | HR 76 | Temp 97.7°F | Resp 16 | Ht 63.0 in | Wt 160.0 lb

## 2014-09-23 DIAGNOSIS — K2100 Gastro-esophageal reflux disease with esophagitis, without bleeding: Secondary | ICD-10-CM

## 2014-09-23 DIAGNOSIS — F419 Anxiety disorder, unspecified: Secondary | ICD-10-CM

## 2014-09-23 DIAGNOSIS — E559 Vitamin D deficiency, unspecified: Secondary | ICD-10-CM

## 2014-09-23 DIAGNOSIS — Z79899 Other long term (current) drug therapy: Secondary | ICD-10-CM

## 2014-09-23 DIAGNOSIS — F32A Depression, unspecified: Secondary | ICD-10-CM

## 2014-09-23 DIAGNOSIS — E785 Hyperlipidemia, unspecified: Secondary | ICD-10-CM

## 2014-09-23 DIAGNOSIS — F329 Major depressive disorder, single episode, unspecified: Secondary | ICD-10-CM

## 2014-09-23 DIAGNOSIS — R7303 Prediabetes: Secondary | ICD-10-CM

## 2014-09-23 DIAGNOSIS — R7309 Other abnormal glucose: Secondary | ICD-10-CM

## 2014-09-23 DIAGNOSIS — I10 Essential (primary) hypertension: Secondary | ICD-10-CM

## 2014-09-23 MED ORDER — ESCITALOPRAM OXALATE 20 MG PO TABS: 20.0000 mg | ORAL_TABLET | Freq: Every day | ORAL | Status: DC

## 2014-09-23 NOTE — Progress Notes (Signed)
Assessment and Plan:  Hypertension: Continue medication, monitor blood pressure at home. Continue DASH diet. Cholesterol: Continue diet and exercise. Check cholesterol.  Pre-diabetes-Continue diet and exercise. Check A1C Vitamin D Def- check level and continue medications.  Hot flashes/depression- try lexapro 20mg  rather than zoloft TMJ-information given to the patient, no gum/decrease hard foods, warm wet wash clothes, decrease stress, talk with dentist about possible night guard, can do massage, and exercise.   Continue diet and meds as discussed. Further disposition pending results of labs.  HPI 58 y.o. female  presents for 3 month follow up with hypertension, hyperlipidemia, prediabetes and vitamin D. Her blood pressure has been controlled at home, today their BP is BP: 122/62 mmHg She does not workout. She denies chest pain, shortness of breath, dizziness.  She is not on cholesterol medication and denies myalgias. Her cholesterol is not at goal. The cholesterol last visit was:   Lab Results  Component Value Date   CHOL 218* 05/12/2014   HDL 54 05/12/2014   LDLCALC 161127* 05/12/2014   TRIG 185* 05/12/2014   CHOLHDL 4.0 05/12/2014   She has been working on diet and exercise for prediabetes, and denies paresthesia of the feet, polydipsia and polyuria. Last A1C in the office was:  Lab Results  Component Value Date   HGBA1C 6.1* 05/12/2014   Patient is on Vitamin D supplement.   Lab Results  Component Value Date   VD25OH 989 05/12/2014     She is going to see her dentist for a night guard for TMJ.  She is going to see Dr. Arlyce DiceKaplan for GERD.   Current Medications:  Current Outpatient Prescriptions on File Prior to Visit  Medication Sig Dispense Refill  . amLODipine-olmesartan (AZOR) 5-40 MG per tablet Take 1 tablet by mouth at bedtime.  90 tablet  1  . aspirin 81 MG tablet Take 81 mg by mouth daily.      . Biotin 300 MCG TABS Take 1 tablet by mouth at bedtime.       . Cholecalciferol (VITAMIN  D3) 2000 UNITS TABS Take 2 tablets by mouth at bedtime.       . cloNIDine (CATAPRES) 0.1 MG tablet Take 0.1 mg by mouth at bedtime.       Marland Kitchen. EVENING PRIMROSE OIL PO Take by mouth daily.      . hydrochlorothiazide (HYDRODIURIL) 25 MG tablet TAKE ONE TABLET BY MOUTH DAILY  90 tablet  1  . loratadine (CLARITIN) 10 MG tablet Take 10 mg by mouth daily as needed for allergies.       . Magnesium 250 MG TABS Take 1 tablet by mouth 2 (two) times daily.       . meloxicam (MOBIC) 15 MG tablet TAKE 1 TABLET (15 MG TOTAL) BY MOUTH DAILY.  30 tablet  2  . Omega-3 Fatty Acids (OMEGA 3 PO) Take 1 capsule by mouth 2 (two) times daily.       Marland Kitchen. omeprazole-sodium bicarbonate (ZEGERID) 40-1100 MG per capsule Take one tab each bedtime  30 capsule  3  . pravastatin (PRAVACHOL) 40 MG tablet TAKE 1 TABLET (40 MG TOTAL) BY MOUTH DAILY.  30 tablet  2  . sertraline (ZOLOFT) 50 MG tablet TAKE 1 TABLET BY MOUTH DAILY.  90 tablet  1   No current facility-administered medications on file prior to visit.   Medical History:  Past Medical History  Diagnosis Date  . Hypertension   . Hyperlipemia   . Depression   . GERD (gastroesophageal reflux  disease)   . Esophageal stricture   . Unspecified vitamin D deficiency   . Anxiety   . Allergy    Allergies:  Allergies  Allergen Reactions  . 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 Clinoril [Sulindac] Rash     Review of Systems: [X]  = complains of  [ ]  = denies  General: Fatigue [ ]  Fever [ ]  Chills [ ]  Weakness [ ]   Insomnia [ ]  Eyes: Redness [ ]  Blurred vision [ ]  Diplopia [ ]   ENT: Congestion [ ]  Sinus Pain [ ]  Post Nasal Drip [ ]  Sore Throat [ ]  Earache [ ]   Cardiac: Chest pain/pressure [ ]  SOB [ ]  Orthopnea [ ]   Palpitations [ ]   Paroxysmal nocturnal dyspnea[ ]  Claudication [ ]  Edema [ ]   Pulmonary: Cough [ ]  Wheezing[ ]   SOB [ ]   Snoring [ ]   GI: Nausea [ ]  Vomiting[ ]  Dysphagia[ ]  Heartburn[ ]  Abdominal pain [ ]  Constipation  [ ] ; Diarrhea [ ] ; BRBPR [ ]  Melena[ ]  GU: Hematuria[ ]  Dysuria [ ]  Nocturia[ ]  Urgency [ ]   Hesitancy [ ]  Discharge [ ]  Neuro: Headaches[ ]  Vertigo[ ]  Paresthesias[ ]  Spasm [ ]  Speech changes [ ]  Incoordination [ ]   Ortho: Arthritis [ ]  Joint pain [ ]  Muscle pain [ ]  Joint swelling [ ]  Back Pain [ ]  Skin:  Rash [ ]   Pruritis [ ]  Change in skin lesion [ ]   Psych: Depression[ ]  Anxiety[ ]  Confusion [ ]  Memory loss [ ]   Heme/Lypmh: Bleeding [ ]  Bruising [ ]  Enlarged lymph nodes [ ]   Endocrine: Visual blurring [ ]  Paresthesia [ ]  Polyuria [ ]  Polydypsea [ ]    Heat/cold intolerance [ ]  Hypoglycemia [ ]   Family history- Review and unchanged Social history- Review and unchanged Physical Exam: BP 122/62  Pulse 76  Temp(Src) 97.7 F (36.5 C)  Resp 16  Ht 5\' 3"  (1.6 m)  Wt 160 lb (72.576 kg)  BMI 28.35 kg/m2 Wt Readings from Last 3 Encounters:  09/23/14 160 lb (72.576 kg)  08/04/14 155 lb 12.8 oz (70.67 kg)  05/12/14 158 lb (71.668 kg)   General Appearance: Well nourished, in no apparent distress. Eyes: PERRLA, EOMs, conjunctiva no swelling or erythema Sinuses: No Frontal/maxillary tenderness ENT/Mouth: Ext aud canals clear, TMs without erythema, bulging. No erythema, swelling, or exudate on post pharynx.  Tonsils not swollen or erythematous. Hearing normal.  Neck: Supple, thyroid normal.  Respiratory: Respiratory effort normal, BS equal bilaterally without rales, rhonchi, wheezing or stridor.  Cardio: RRR with no MRGs. Brisk peripheral pulses without edema.  Abdomen: Soft, + BS.  Non tender, no guarding, rebound, hernias, masses. Lymphatics: Non tender without lymphadenopathy.  Musculoskeletal: Full ROM, 5/5 strength, normal gait.  Skin: Warm, dry without rashes, lesions, ecchymosis.  Neuro: Cranial nerves intact. Normal muscle tone, no cerebellar symptoms. Sensation intact.  Psych: Awake and oriented X 3, normal affect, Insight and Judgment appropriate.    Quentin Mullingollier, Jumaane Weatherford 4:16  PM

## 2014-09-23 NOTE — Patient Instructions (Signed)
Stop zoloft/sertiline and start Lexapro 20mg  can do 1/2 or 1 pill daily- this is in the same family as zoloft but has been found to help hot flashes better than zoloft..     Bad carbs also include fruit juice, alcohol, and sweet tea. These are empty calories that do not signal to your brain that you are full.   Please remember the good carbs are still carbs which convert into sugar. So please measure them out no more than 1/2-1 cup of rice, oatmeal, pasta, and beans.  Veggies are however free foods! Pile them on.   I like lean protein at every meal such as chicken, Malawiturkey, pork chops, cottage cheese, etc. Just do not fry these meats and please center your meal around vegetable, the meats should be a side dish.   No all fruit is created equal. Please see the list below, the fruit at the bottom is higher in sugars than the fruit at the top

## 2014-09-24 LAB — INSULIN, FASTING: Insulin fasting, serum: 2.1 u[IU]/mL (ref 2.0–19.6)

## 2014-09-24 LAB — BASIC METABOLIC PANEL WITH GFR
BUN: 27 mg/dL — ABNORMAL HIGH (ref 6–23)
CO2: 29 meq/L (ref 19–32)
CREATININE: 1.02 mg/dL (ref 0.50–1.10)
Calcium: 9.8 mg/dL (ref 8.4–10.5)
Chloride: 104 mEq/L (ref 96–112)
GFR, Est African American: 71 mL/min
GFR, Est Non African American: 62 mL/min
GLUCOSE: 63 mg/dL — AB (ref 70–99)
Potassium: 4.2 mEq/L (ref 3.5–5.3)
SODIUM: 143 meq/L (ref 135–145)

## 2014-09-24 LAB — LIPID PANEL
CHOLESTEROL: 157 mg/dL (ref 0–200)
HDL: 54 mg/dL (ref >39)
LDL Cholesterol: 82 mg/dL (ref 0–99)
TRIGLYCERIDES: 106 mg/dL (ref <150)
Total CHOL/HDL Ratio: 2.9 Ratio
VLDL: 21 mg/dL (ref 0–40)

## 2014-09-24 LAB — CBC WITH DIFFERENTIAL/PLATELET
BASOS PCT: 0 % (ref 0–1)
Basophils Absolute: 0 10*3/uL (ref 0.0–0.1)
EOS ABS: 0.2 10*3/uL (ref 0.0–0.7)
Eosinophils Relative: 4 % (ref 0–5)
HCT: 38.8 % (ref 36.0–46.0)
Hemoglobin: 13.2 g/dL (ref 12.0–15.0)
LYMPHS ABS: 2.7 10*3/uL (ref 0.7–4.0)
Lymphocytes Relative: 52 % — ABNORMAL HIGH (ref 12–46)
MCH: 31.2 pg (ref 26.0–34.0)
MCHC: 34 g/dL (ref 30.0–36.0)
MCV: 91.7 fL (ref 78.0–100.0)
MONOS PCT: 9 % (ref 3–12)
Monocytes Absolute: 0.5 10*3/uL (ref 0.1–1.0)
NEUTROS PCT: 35 % — AB (ref 43–77)
Neutro Abs: 1.8 10*3/uL (ref 1.7–7.7)
PLATELETS: 194 10*3/uL (ref 150–400)
RBC: 4.23 MIL/uL (ref 3.87–5.11)
RDW: 14 % (ref 11.5–15.5)
WBC: 5.2 10*3/uL (ref 4.0–10.5)

## 2014-09-24 LAB — HEPATIC FUNCTION PANEL
ALBUMIN: 4.5 g/dL (ref 3.5–5.2)
ALT: 24 U/L (ref 0–35)
AST: 20 U/L (ref 0–37)
Alkaline Phosphatase: 77 U/L (ref 39–117)
Bilirubin, Direct: 0.1 mg/dL (ref 0.0–0.3)
Indirect Bilirubin: 0.2 mg/dL (ref 0.2–1.2)
TOTAL PROTEIN: 6.7 g/dL (ref 6.0–8.3)
Total Bilirubin: 0.3 mg/dL (ref 0.2–1.2)

## 2014-09-24 LAB — MAGNESIUM: MAGNESIUM: 2.1 mg/dL (ref 1.5–2.5)

## 2014-09-24 LAB — VITAMIN D 25 HYDROXY (VIT D DEFICIENCY, FRACTURES): Vit D, 25-Hydroxy: 97 ng/mL — ABNORMAL HIGH (ref 30–89)

## 2014-09-24 LAB — HEMOGLOBIN A1C
HEMOGLOBIN A1C: 6.1 % — AB (ref <5.7)
MEAN PLASMA GLUCOSE: 128 mg/dL — AB (ref <117)

## 2014-09-24 LAB — TSH: TSH: 0.703 u[IU]/mL (ref 0.350–4.500)

## 2014-10-09 ENCOUNTER — Encounter: Payer: Self-pay | Admitting: Gastroenterology

## 2014-10-09 ENCOUNTER — Ambulatory Visit (AMBULATORY_SURGERY_CENTER): Payer: Managed Care, Other (non HMO) | Admitting: Gastroenterology

## 2014-10-09 VITALS — BP 146/81 | HR 65 | Temp 97.5°F | Resp 27 | Ht 63.0 in | Wt 155.0 lb

## 2014-10-09 DIAGNOSIS — K222 Esophageal obstruction: Secondary | ICD-10-CM

## 2014-10-09 DIAGNOSIS — R1310 Dysphagia, unspecified: Secondary | ICD-10-CM

## 2014-10-09 MED ORDER — SODIUM CHLORIDE 0.9 % IV SOLN: 500.0000 mL | INTRAVENOUS | Status: DC

## 2014-10-09 NOTE — Progress Notes (Signed)
Report to PACU, RN, vss, BBS= Clear.  

## 2014-10-09 NOTE — Progress Notes (Signed)
Called to room to assist during endoscopic procedure.  Patient ID and intended procedure confirmed with present staff. Received instructions for my participation in the procedure from the performing physician.  

## 2014-10-09 NOTE — Op Note (Signed)
 [facility] [address] [place], [id number]   ENDOSCOPY PROCEDURE REPORT  PATIENT: [name], Kaitlyn C  MR#: [id number] BIRTHDATE: 1956-04-05 , 58  yrs. old GENDER: female ENDOSCOPIST: Louis Meckelobert D Nnamdi Dacus, MD REFERRED BY:  Lucky CowboyWilliam McKeown, M.D. PROCEDURE DATE:  10/09/2014 PROCEDURE:  EGD, diagnostic and Maloney dilation of esophagus ASA CLASS:     Class II INDICATIONS:  dysphagia. MEDICATIONS: Monitored anesthesia care, Propofol 170 mg IV, and lidocaine 120 mg IV TOPICAL ANESTHETIC:  DESCRIPTION OF PROCEDURE: After the risks benefits and alternatives of the procedure were thoroughly explained, informed consent was obtained.  The LB [id number] endoscope was introduced through the mouth and advanced to the second portion of the duodenum , Without limitations.  The instrument was slowly withdrawn as the mucosa was fully examined.    ESOPHAGUS: There was a short mild and benign appearing stricture at the gastroesophageal junction.  The stricture was easily traversable.   The stricture was dilated using a 17.375mm (51Fr) Maloney dilator.   Except for the findings listed the EGD was otherwise normal.  Retroflexed views revealed no abnormalities. The scope was then withdrawn from the patient and the procedure completed.  COMPLICATIONS: There were no immediate complications.  ENDOSCOPIC IMPRESSION: 1.   There was a short stricture at the gastroesophageal junction 2.   The stricture was dilated using a 17.895mm (52Fr) Maloney dilator 3.   EGD was otherwise normal  RECOMMENDATIONS: repeat dilation as needed  REPEAT EXAM:  eSigned:  Louis Meckelobert D Sharlene Mccluskey, MD 10/09/2014 1:51 PM    CC:

## 2014-10-09 NOTE — Patient Instructions (Signed)
YOU HAD AN ENDOSCOPIC PROCEDURE TODAY AT THE [facility]: Refer to the procedure report that was given to you for any specific questions about what was found during the examination.  If the procedure report does not answer your questions, please call your gastroenterologist to clarify.  If you requested that your care partner not be given the details of your procedure findings, then the procedure report has been included in a sealed envelope for you to review at your convenience later.  YOU SHOULD EXPECT: Some feelings of bloating in the abdomen. Passage of more gas than usual.  Walking can help get rid of the air that was put into your GI tract during the procedure and reduce the bloating. If you had a lower endoscopy (such as a colonoscopy or flexible sigmoidoscopy) you may notice spotting of blood in your stool or on the toilet paper. If you underwent a bowel prep for your procedure, then you may not have a normal bowel movement for a few days.  DIET: Nothing by mouth until 2:45pm.  At 2:45 you may have clear liquids.  If they are tolerated, you may go to a soft diet for the rest of today. Tomorrow, you may have a regular diet.  Drink plenty of fluids but you should avoid alcoholic beverages for 24 hours.  ACTIVITY: Your care partner should take you home directly after the procedure.  You should plan to take it easy, moving slowly for the rest of the day.  You can resume normal activity the day after the procedure however you should NOT DRIVE or use heavy machinery for 24 hours (because of the sedation medicines used during the test).    SYMPTOMS TO REPORT IMMEDIATELY: A gastroenterologist can be reached at any hour.  During normal business hours, 8:30 AM to 5:00 PM Monday through Friday, call [phone].  After hours and on weekends, please call the GI answering service at [phone] who will take a message and have the physician on call contact you.   Following upper endoscopy  (EGD)  Vomiting of blood or coffee ground material  New chest pain or pain under the shoulder blades  Painful or persistently difficult swallowing  New shortness of breath  Fever of 100F or higher  Black, tarry-looking stools  FOLLOW UP: If any biopsies were taken you will be contacted by phone or by letter within the next 1-3 weeks.  Call your gastroenterologist if you have not heard about the biopsies in 3 weeks.  Our staff will call the home number listed on your records the next business day following your procedure to check on you and address any questions or concerns that you may have at that time regarding the information given to you following your procedure. This is a courtesy call and so if there is no answer at the home number and we have not heard from you through the emergency physician on call, we will assume that you have returned to your regular daily activities without incident.  SIGNATURES/CONFIDENTIALITY: You and/or your care partner have signed paperwork which will be entered into your electronic medical record.  These signatures attest to the fact that that the information above on your After Visit Summary has been reviewed and is understood.  Full responsibility of the confidentiality of this discharge information lies with you and/or your care-partner.  Read the handouts given to you by your recovery room nurse.

## 2014-10-12 ENCOUNTER — Telehealth: Payer: Self-pay | Admitting: *Deleted

## 2014-10-12 NOTE — Telephone Encounter (Signed)
Message left

## 2014-10-22 ENCOUNTER — Ambulatory Visit (HOSPITAL_COMMUNITY)
Admission: RE | Admit: 2014-10-22 | Discharge: 2014-10-22 | Disposition: A | Discharge status: Home / Self Care | Payer: Managed Care, Other (non HMO) | Source: Ambulatory Visit | Attending: Emergency Medicine | Admitting: Emergency Medicine

## 2014-10-22 DIAGNOSIS — Z1231 Encounter for screening mammogram for malignant neoplasm of breast: Secondary | ICD-10-CM | POA: Insufficient documentation

## 2014-10-26 ENCOUNTER — Other Ambulatory Visit: Payer: Self-pay | Admitting: Emergency Medicine

## 2014-10-26 DIAGNOSIS — R928 Other abnormal and inconclusive findings on diagnostic imaging of breast: Secondary | ICD-10-CM

## 2014-11-02 ENCOUNTER — Other Ambulatory Visit: Payer: Self-pay | Admitting: Gastroenterology

## 2014-11-02 ENCOUNTER — Other Ambulatory Visit: Payer: Self-pay

## 2014-11-02 ENCOUNTER — Other Ambulatory Visit: Payer: Self-pay | Admitting: Podiatry

## 2014-11-02 MED ORDER — PRAVASTATIN SODIUM 40 MG PO TABS: ORAL_TABLET | ORAL | Status: DC

## 2014-11-04 ENCOUNTER — Other Ambulatory Visit: Payer: Self-pay | Admitting: *Deleted

## 2014-11-04 MED ORDER — PRAVASTATIN SODIUM 40 MG PO TABS: ORAL_TABLET | ORAL | Status: DC

## 2014-11-17 ENCOUNTER — Ambulatory Visit
Admission: RE | Admit: 2014-11-17 | Discharge: 2014-11-17 | Disposition: A | Discharge status: Home / Self Care | Payer: Managed Care, Other (non HMO) | Source: Ambulatory Visit | Attending: Emergency Medicine | Admitting: Emergency Medicine

## 2014-11-17 DIAGNOSIS — R928 Other abnormal and inconclusive findings on diagnostic imaging of breast: Secondary | ICD-10-CM

## 2014-11-18 ENCOUNTER — Ambulatory Visit (INDEPENDENT_AMBULATORY_CARE_PROVIDER_SITE_OTHER): Payer: Managed Care, Other (non HMO) | Admitting: Physician Assistant

## 2014-11-18 ENCOUNTER — Encounter: Payer: Self-pay | Admitting: Physician Assistant

## 2014-11-18 VITALS — BP 118/64 | HR 70 | Temp 98.2°F | Resp 16 | Ht 63.0 in | Wt 157.0 lb

## 2014-11-18 DIAGNOSIS — R300 Dysuria: Secondary | ICD-10-CM

## 2014-11-18 MED ORDER — SULFAMETHOXAZOLE-TRIMETHOPRIM 800-160 MG PO TABS
1.0000 | ORAL_TABLET | Freq: Two times a day (BID) | ORAL | Status: DC
Start: 2014-11-18 — End: 2015-05-02

## 2014-11-18 NOTE — Progress Notes (Signed)
Subjective:    Patient ID: Kaitlyn [name], female    DOB: 10/29/1956, 58 y.o.   MRN: [id number]  Dysuria  This is a new problem. The current episode started 1 to 4 weeks ago (Has been going on for 3 weeks and sxs were coming anf going, but have been constant since yesterday.). The problem occurs every urination. The problem has been unchanged. The quality of the pain is described as burning. The pain is at a severity of 9/10. She is not sexually active. There is no history of pyelonephritis. Associated symptoms include nausea and urgency. Pertinent negatives include no chills, frequency or vomiting. She has tried nothing for the symptoms.  GFR= 71 on 09/23/14 Review of Systems  Constitutional: Negative.  Negative for fever, chills, diaphoresis and fatigue.  HENT: Negative.   Eyes: Negative.   Respiratory: Positive for cough and wheezing. Negative for chest tightness and shortness of breath.   Cardiovascular: Negative.   Gastrointestinal: Positive for nausea. Negative for vomiting, abdominal pain, diarrhea and constipation.  Genitourinary: Positive for dysuria and urgency. Negative for frequency, vaginal bleeding and vaginal discharge.       Incontinence- going on for 3 weeks. Had spotting of blood on toilet paper when she wiped.  Musculoskeletal: Negative.   Skin: Negative.  Negative for rash.  Neurological: Negative.    Past Medical History  Diagnosis Date  . Hypertension   . Hyperlipemia   . Depression   . GERD (gastroesophageal reflux disease)   . Esophageal stricture   . Unspecified vitamin D deficiency   . Anxiety   . Allergy    Current Outpatient Prescriptions on File Prior to Visit  Medication Sig Dispense Refill  . amLODipine-olmesartan (AZOR) 5-40 MG per tablet Take 1 tablet by mouth at bedtime. 90 tablet 1  . aspirin 81 MG tablet Take 81 mg by mouth daily.    . Biotin 300 MCG TABS Take 1 tablet by mouth at bedtime.     . Cholecalciferol (VITAMIN D3) 2000 UNITS TABS Take  2 tablets by mouth at bedtime.     . cloNIDine (CATAPRES) 0.1 MG tablet Take 0.1 mg by mouth at bedtime.     Marland Kitchen. escitalopram (LEXAPRO) 20 MG tablet Take 1 tablet (20 mg total) by mouth daily. 30 tablet 2  . EVENING PRIMROSE OIL PO Take by mouth daily.    . hydrochlorothiazide (HYDRODIURIL) 25 MG tablet TAKE ONE TABLET BY MOUTH DAILY 90 tablet 1  . latanoprost (XALATAN) 0.005 % ophthalmic solution Place 1 drop into both eyes at bedtime.    Marland Kitchen. loratadine (CLARITIN) 10 MG tablet Take 10 mg by mouth daily as needed for allergies.     . Magnesium 250 MG TABS Take 1 tablet by mouth 2 (two) times daily.     . meloxicam (MOBIC) 15 MG tablet TAKE 1 TABLET (15 MG TOTAL) BY MOUTH DAILY. 30 tablet 1  . Omega-3 Fatty Acids (OMEGA 3 PO) Take 1 capsule by mouth 2 (two) times daily.     Marland Kitchen. omeprazole-sodium bicarbonate (ZEGERID) 40-1100 MG per capsule TAKE ONE capsule  at BEDTIME 30 capsule 2  . pravastatin (PRAVACHOL) 40 MG tablet TAKE 1 TABLET (40 MG TOTAL) BY MOUTH DAILY. 30 tablet 3   No current facility-administered medications on file prior to visit.   Allergies  Allergen Reactions  . 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 Clinoril [Sulindac] Rash     BP 118/64 mmHg  Pulse 70  Temp(Src) 98.2 F (36.8 C) (Temporal)  Resp 16  Ht 5\' 3"  (1.6 m)  Wt 157 lb (71.215 kg)  BMI 27.82 kg/m2 Wt Readings from Last 3 Encounters:  11/18/14 157 lb (71.215 kg)  10/09/14 155 lb (70.308 kg)  09/23/14 160 lb (72.576 kg)   Objective:   Physical Exam  Constitutional: She is oriented to person, place, and time. She appears well-developed and well-nourished. She has a sickly appearance. No distress.  HENT:  Head: Normocephalic.  Eyes: Conjunctivae and lids are normal. Right eye exhibits no discharge. Left eye exhibits no discharge. No scleral icterus.  Neck: Phonation normal.  Cardiovascular: Normal rate, regular rhythm, S1 normal, S2 normal, normal heart sounds and normal  pulses.  Exam reveals no gallop, no distant heart sounds and no friction rub.   No murmur heard. Pulmonary/Chest: Effort normal and breath sounds normal. No respiratory distress. She has no decreased breath sounds. She has no wheezes. She has no rhonchi. She has no rales. She exhibits no tenderness.  Abdominal: Soft. Normal appearance and bowel sounds are normal. She exhibits no distension and no mass. There is no hepatosplenomegaly. There is tenderness in the suprapubic area. There is no rebound, no guarding and no CVA tenderness. No hernia.  Neurological: She is alert and oriented to person, place, and time. Gait normal.  Skin: Skin is warm, dry and intact. No rash noted. She is not diaphoretic.  Psychiatric: She has a normal mood and affect. Her speech is normal and behavior is normal. Judgment and thought content normal. Cognition and memory are normal.  Vitals reviewed.  Assessment & Plan:  1. Dysuria Ordered labs to R/O UTI - Urine culture - Urinalysis, Routine w reflex microscopic -Take Bactrim DS as prescribed with food- sulfamethoxazole-trimethoprim (BACTRIM DS,SEPTRA DS) 800-160 MG per tablet; Take 1 tablet by mouth 2 (two) times daily. For 5 days until sensitivity is back.  Dispense: 10 tablet; Refill: 0  Discussed medication effects and SE's.  Pt agreed to treatment plan. If you are not feeling better in 7-10 days, then please call the office.  Quintan Saldivar, Lise AuerJennifer L, PA-C 4:48 PM [facility]

## 2014-11-18 NOTE — Patient Instructions (Addendum)
-  Take Bactrim as prescribed with food.  Might change antibiotics depending on culture results. -Make sure you are drinking plenty of water to stay hydrated.  If you are not feeling better in 7-10 days, then please call the office.  Urinary Tract Infection Urinary tract infections (UTIs) can develop anywhere along your urinary tract. Your urinary tract is your body's drainage system for removing wastes and extra water. Your urinary tract includes two kidneys, two ureters, a bladder, and a urethra. Your kidneys are a pair of bean-shaped organs. Each kidney is about the size of your fist. They are located below your ribs, one on each side of your spine. CAUSES Infections are caused by microbes, which are microscopic organisms, including fungi, viruses, and bacteria. These organisms are so small that they can only be seen through a microscope. Bacteria are the microbes that most commonly cause UTIs. SYMPTOMS  Symptoms of UTIs may vary by age and gender of the patient and by the location of the infection. Symptoms in young women typically include a frequent and intense urge to urinate and a painful, burning feeling in the bladder or urethra during urination. Older women and men are more likely to be tired, shaky, and weak and have muscle aches and abdominal pain. A fever may mean the infection is in your kidneys. Other symptoms of a kidney infection include pain in your back or sides below the ribs, nausea, and vomiting. DIAGNOSIS To diagnose a UTI, your caregiver will ask you about your symptoms. Your caregiver also will ask to provide a urine sample. The urine sample will be tested for bacteria and white blood cells. White blood cells are made by your body to help fight infection. TREATMENT  Typically, UTIs can be treated with medication. Because most UTIs are caused by a bacterial infection, they usually can be treated with the use of antibiotics. The choice of antibiotic and length of treatment depend  on your symptoms and the type of bacteria causing your infection. HOME CARE INSTRUCTIONS  If you were prescribed antibiotics, take them exactly as your caregiver instructs you. Finish the medication even if you feel better after you have only taken some of the medication.  Drink enough water and fluids to keep your urine clear or pale yellow.  Avoid caffeine, tea, and carbonated beverages. They tend to irritate your bladder.  Empty your bladder often. Avoid holding urine for long periods of time.  Empty your bladder before and after sexual intercourse.  After a bowel movement, women should cleanse from front to back. Use each tissue only once. SEEK MEDICAL CARE IF:   You have back pain.  You develop a fever.  Your symptoms do not begin to resolve within 3 days. SEEK IMMEDIATE MEDICAL CARE IF:   You have severe back pain or lower abdominal pain.  You develop chills.  You have nausea or vomiting.  You have continued burning or discomfort with urination. MAKE SURE YOU:   Understand these instructions.  Will watch your condition.  Will get help right away if you are not doing well or get worse. Document Released: 09/06/2005 Document Revised: 05/28/2012 Document Reviewed: 01/05/2012 [facility] Patient Information 2015 [employer] [place]. This information is not intended to replace advice given to you by your health care provider. Make sure you discuss any questions you have with your health care provider.

## 2014-11-19 LAB — URINALYSIS, ROUTINE W REFLEX MICROSCOPIC
BILIRUBIN URINE: NEGATIVE
GLUCOSE, UA: NEGATIVE mg/dL
Hgb urine dipstick: NEGATIVE
KETONES UR: NEGATIVE mg/dL
Nitrite: NEGATIVE
PROTEIN: NEGATIVE mg/dL
Specific Gravity, Urine: 1.019 (ref 1.005–1.030)
Urobilinogen, UA: 0.2 mg/dL (ref 0.0–1.0)
pH: 6 (ref 5.0–8.0)

## 2014-11-19 LAB — URINALYSIS, MICROSCOPIC ONLY
Casts: NONE SEEN
Crystals: NONE SEEN

## 2014-11-20 ENCOUNTER — Other Ambulatory Visit: Payer: Self-pay | Admitting: Physician Assistant

## 2014-11-21 LAB — URINE CULTURE

## 2014-12-29 ENCOUNTER — Ambulatory Visit: Payer: Self-pay | Admitting: Physician Assistant

## 2015-01-01 ENCOUNTER — Other Ambulatory Visit: Payer: Self-pay | Admitting: Internal Medicine

## 2015-01-19 ENCOUNTER — Encounter: Payer: Self-pay | Admitting: *Deleted

## 2015-01-20 ENCOUNTER — Other Ambulatory Visit: Payer: Self-pay | Admitting: Physician Assistant

## 2015-02-02 ENCOUNTER — Other Ambulatory Visit: Payer: Self-pay | Admitting: Podiatry

## 2015-02-04 ENCOUNTER — Ambulatory Visit: Payer: Self-pay | Admitting: Physician Assistant

## 2015-02-21 ENCOUNTER — Ambulatory Visit (INDEPENDENT_AMBULATORY_CARE_PROVIDER_SITE_OTHER): Payer: Managed Care, Other (non HMO) | Admitting: Emergency Medicine

## 2015-02-21 VITALS — BP 144/62 | HR 99 | Temp 99.6°F | Resp 18 | Ht 64.0 in | Wt 154.0 lb

## 2015-02-21 DIAGNOSIS — R509 Fever, unspecified: Secondary | ICD-10-CM | POA: Diagnosis not present

## 2015-02-21 DIAGNOSIS — R05 Cough: Secondary | ICD-10-CM

## 2015-02-21 DIAGNOSIS — R52 Pain, unspecified: Secondary | ICD-10-CM | POA: Diagnosis not present

## 2015-02-21 DIAGNOSIS — R059 Cough, unspecified: Secondary | ICD-10-CM

## 2015-02-21 LAB — POCT INFLUENZA A/B
Influenza A, POC: POSITIVE
Influenza B, POC: NEGATIVE

## 2015-02-21 MED ORDER — OSELTAMIVIR PHOSPHATE 75 MG PO CAPS: 75.0000 mg | ORAL_CAPSULE | Freq: Two times a day (BID) | ORAL | Status: DC

## 2015-02-21 NOTE — Progress Notes (Signed)
   Subjective:    Patient ID: Kaitlyn [name], female    DOB: 02/15/1956, 59 y.o.   MRN: [id number] This chart was scribed for Lesle ChrisSteven Leeam Cedrone, MD by Littie Deedsichard Sun, Medical Scribe. This patient was seen in room 10 and the patient's care was started at 8:29 AM.   HPI HPI Comments: Kaitlyn [name] is a 59 y.o. female who presents to the [facility] complaining of gradual onset flu-like symptoms that started 2 days ago. Patient reports having cough, subjective fever, chest pain due to cough, generalized myalgias, headache and generalized weakness. She has used BC powder with some relief to her fever. She denies sore throat. Patient works at [employer].   Review of Systems  Constitutional: Positive for fever.  HENT: Negative for sore throat.   Respiratory: Positive for cough.   Cardiovascular: Positive for chest pain.  Musculoskeletal: Positive for myalgias.  Neurological: Positive for weakness and headaches.       Objective:   Physical Exam CONSTITUTIONAL: Well developed/well nourished HEAD: Normocephalic/atraumatic EYES: EOM/PERRL ENMT: Mucous membranes moist. Nasal congestion. Mild redness of the posterior pharynx. NECK: supple no meningeal signs SPINE: entire spine nontender CV: S1/S2 noted, no murmurs/rubs/gallops noted. Chest exam is clear. LUNGS: Lungs are clear to auscultation bilaterally, no apparent distress ABDOMEN: soft, nontender, no rebound or guarding GU: no cva tenderness NEURO: Pt is awake/alert, moves all extremitiesx4 EXTREMITIES: pulses normal, full ROM SKIN: warm, color normal PSYCH: no abnormalities of mood noted Results for orders placed or performed in visit on 02/21/15  POCT Influenza A/B  Result Value Ref Range   Influenza A, POC Positive    Influenza B, POC Negative         Assessment & Plan:  Patient placed on Tamiflu she was given a note out of work until Thursday she will continue Tylenol for her fever.I personally performed the  services described in this documentation, which was scribed in my presence. The recorded information has been reviewed and is accurate.

## 2015-02-21 NOTE — Patient Instructions (Signed)

## 2015-03-29 ENCOUNTER — Other Ambulatory Visit: Payer: Self-pay | Admitting: Internal Medicine

## 2015-05-01 ENCOUNTER — Other Ambulatory Visit: Payer: Self-pay | Admitting: Internal Medicine

## 2015-05-02 ENCOUNTER — Other Ambulatory Visit: Payer: Self-pay | Admitting: Internal Medicine

## 2015-05-02 DIAGNOSIS — I10 Essential (primary) hypertension: Secondary | ICD-10-CM

## 2015-05-02 MED ORDER — AMLODIPINE-OLMESARTAN 5-40 MG PO TABS: ORAL_TABLET | ORAL | Status: DC

## 2015-05-13 ENCOUNTER — Ambulatory Visit (INDEPENDENT_AMBULATORY_CARE_PROVIDER_SITE_OTHER): Payer: Managed Care, Other (non HMO) | Admitting: Internal Medicine

## 2015-05-13 ENCOUNTER — Encounter: Payer: Self-pay | Admitting: Internal Medicine

## 2015-05-13 VITALS — BP 156/80 | HR 94 | Temp 98.2°F | Resp 16 | Ht 63.0 in | Wt 164.0 lb

## 2015-05-13 DIAGNOSIS — F419 Anxiety disorder, unspecified: Secondary | ICD-10-CM

## 2015-05-13 DIAGNOSIS — F329 Major depressive disorder, single episode, unspecified: Secondary | ICD-10-CM

## 2015-05-13 DIAGNOSIS — I10 Essential (primary) hypertension: Secondary | ICD-10-CM

## 2015-05-13 DIAGNOSIS — E785 Hyperlipidemia, unspecified: Secondary | ICD-10-CM

## 2015-05-13 DIAGNOSIS — K2100 Gastro-esophageal reflux disease with esophagitis, without bleeding: Secondary | ICD-10-CM

## 2015-05-13 DIAGNOSIS — E559 Vitamin D deficiency, unspecified: Secondary | ICD-10-CM

## 2015-05-13 DIAGNOSIS — R5383 Other fatigue: Secondary | ICD-10-CM

## 2015-05-13 DIAGNOSIS — F32A Depression, unspecified: Secondary | ICD-10-CM

## 2015-05-13 DIAGNOSIS — R7309 Other abnormal glucose: Secondary | ICD-10-CM

## 2015-05-13 DIAGNOSIS — R7303 Prediabetes: Secondary | ICD-10-CM

## 2015-05-13 DIAGNOSIS — Z79899 Other long term (current) drug therapy: Secondary | ICD-10-CM | POA: Insufficient documentation

## 2015-05-13 DIAGNOSIS — Z1212 Encounter for screening for malignant neoplasm of rectum: Secondary | ICD-10-CM

## 2015-05-13 LAB — LIPID PANEL
CHOL/HDL RATIO: 3.5 ratio
CHOLESTEROL: 204 mg/dL — AB (ref 0–200)
HDL: 58 mg/dL (ref >=46)
LDL CALC: 110 mg/dL — AB (ref 0–99)
TRIGLYCERIDES: 180 mg/dL — AB (ref <150)
VLDL: 36 mg/dL (ref 0–40)

## 2015-05-13 LAB — IRON AND TIBC
%SAT: 24 % (ref 20–55)
Iron: 73 ug/dL (ref 42–145)
TIBC: 300 ug/dL (ref 250–470)
UIBC: 227 ug/dL (ref 125–400)

## 2015-05-13 LAB — CBC WITH DIFFERENTIAL/PLATELET
BASOS PCT: 0 % (ref 0–1)
Basophils Absolute: 0 10*3/uL (ref 0.0–0.1)
Eosinophils Absolute: 0.1 10*3/uL (ref 0.0–0.7)
Eosinophils Relative: 1 % (ref 0–5)
HCT: 42.7 % (ref 36.0–46.0)
HEMOGLOBIN: 14.4 g/dL (ref 12.0–15.0)
LYMPHS PCT: 41 % (ref 12–46)
Lymphs Abs: 2.6 10*3/uL (ref 0.7–4.0)
MCH: 31.9 pg (ref 26.0–34.0)
MCHC: 33.7 g/dL (ref 30.0–36.0)
MCV: 94.5 fL (ref 78.0–100.0)
MPV: 10.3 fL (ref 8.6–12.4)
Monocytes Absolute: 0.4 10*3/uL (ref 0.1–1.0)
Monocytes Relative: 6 % (ref 3–12)
NEUTROS ABS: 3.3 10*3/uL (ref 1.7–7.7)
Neutrophils Relative %: 52 % (ref 43–77)
Platelets: 206 10*3/uL (ref 150–400)
RBC: 4.52 MIL/uL (ref 3.87–5.11)
RDW: 13.5 % (ref 11.5–15.5)
WBC: 6.3 10*3/uL (ref 4.0–10.5)

## 2015-05-13 LAB — HEPATIC FUNCTION PANEL
ALK PHOS: 79 U/L (ref 39–117)
ALT: 25 U/L (ref 0–35)
AST: 23 U/L (ref 0–37)
Albumin: 4.7 g/dL (ref 3.5–5.2)
BILIRUBIN DIRECT: 0.1 mg/dL (ref 0.0–0.3)
BILIRUBIN TOTAL: 0.2 mg/dL (ref 0.2–1.2)
Indirect Bilirubin: 0.1 mg/dL — ABNORMAL LOW (ref 0.2–1.2)
TOTAL PROTEIN: 7 g/dL (ref 6.0–8.3)

## 2015-05-13 LAB — TSH: TSH: 0.882 u[IU]/mL (ref 0.350–4.500)

## 2015-05-13 LAB — BASIC METABOLIC PANEL WITH GFR
BUN: 15 mg/dL (ref 6–23)
CALCIUM: 9.8 mg/dL (ref 8.4–10.5)
CO2: 30 meq/L (ref 19–32)
CREATININE: 0.75 mg/dL (ref 0.50–1.10)
Chloride: 105 mEq/L (ref 96–112)
GFR, EST NON AFRICAN AMERICAN: 89 mL/min
GFR, Est African American: >89 mL/min
Glucose, Bld: 95 mg/dL (ref 70–99)
POTASSIUM: 4 meq/L (ref 3.5–5.3)
Sodium: 143 mEq/L (ref 135–145)

## 2015-05-13 LAB — VITAMIN B12: Vitamin B-12: 758 pg/mL (ref 211–911)

## 2015-05-13 LAB — HEMOGLOBIN A1C
Hgb A1c MFr Bld: 5.8 % — ABNORMAL HIGH (ref <5.7)
MEAN PLASMA GLUCOSE: 120 mg/dL — AB (ref <117)

## 2015-05-13 LAB — MAGNESIUM: Magnesium: 2 mg/dL (ref 1.5–2.5)

## 2015-05-13 MED ORDER — AZELASTINE HCL 0.1 % NA SOLN: 2.0000 | Freq: Two times a day (BID) | NASAL | Status: DC

## 2015-05-13 MED ORDER — CLOTRIMAZOLE-BETAMETHASONE 1-0.05 % EX CREA: TOPICAL_CREAM | CUTANEOUS | Status: DC

## 2015-05-13 MED ORDER — AMLODIPINE BESYLATE 5 MG PO TABS: 5.0000 mg | ORAL_TABLET | Freq: Every day | ORAL | Status: DC

## 2015-05-13 MED ORDER — OLMESARTAN MEDOXOMIL 40 MG PO TABS
40.0000 mg | ORAL_TABLET | Freq: Every day | ORAL | Status: DC
Start: 2015-05-13 — End: 2015-08-30

## 2015-05-13 NOTE — Progress Notes (Signed)
Patient ID: Kaitlyn [name], female   DOB: 1956/10/13, 59 y.o.   MRN: [id number]  Complete Physical  Assessment and Plan:   1. Essential hypertension  - Urinalysis, Routine w reflex microscopic (not at [facility]) - Microalbumin / creatinine urine ratio - EKG 12-Lead - [place], RETROPERITNL ABD,  LTD - TSH - amLODipine (NORVASC) 5 MG tablet; Take 1 tablet (5 mg total) by mouth daily.  Dispense: 30 tablet; Refill: 11 - olmesartan (BENICAR) 40 MG tablet; Take 1 tablet (40 mg total) by mouth daily.  Dispense: 30 tablet; Refill: 11  2. Gastroesophageal reflux disease with esophagitis -currently well controlled  3. Vitamin D deficiency -cont supplement - Vit D  25 hydroxy (rtn osteoporosis monitoring)  4. Hyperlipemia -cont pravastatin - Lipid panel  5. Medication management  - CBC with Differential/Platelet - BASIC METABOLIC PANEL WITH GFR - Hepatic function panel - Magnesium  6. Anxiety -well controlled on lexapro  7. Depression -cont lexapro 8. Screening for rectal cancer  - POC Hemoccult Bld/Stl (3-Cd Home Screen); Future  9. Prediabetes  - Hemoglobin A1c - Insulin, random  10. Other fatigue  - Iron and TIBC - Vitamin B12  11.  Yeast infection skin -clotrimazole  12.  Allergic rhinitis -cont loratidine -astelin -flonase   Discussed med's effects and SE's. Screening labs and tests as requested with regular follow-up as recommended.  HPI  59 y.o. female  presents for a complete physical.  Her blood pressure has been controlled at home, today their BP is BP: (!) 156/80 mmHg.  She does not workout. She denies chest pain, shortness of breath, dizziness.   She is on cholesterol medication and denies myalgias. Her cholesterol is not at goal. The cholesterol last visit was:  Lab Results  Component Value Date   CHOL 157 09/23/2014   HDL 54 09/23/2014   LDLCALC 82 09/23/2014   TRIG 106 09/23/2014   CHOLHDL 2.9 09/23/2014  .  She has not been working on diet and  exercise for prediabetes, she is on bASA, she is on ACE/ARB and denies foot ulcerations, hyperglycemia, hypoglycemia , increased appetite, nausea, paresthesia of the feet, polydipsia, polyuria, visual disturbances, vomiting and weight loss. Last A1C in the office was:  Lab Results  Component Value Date   HGBA1C 6.1* 09/23/2014    Patient is on Vitamin D supplement.   Lab Results  Component Value Date   VD25OH 49* 09/23/2014      She does have some severe allergies and has been taking claritin but that does not seem to be helping anymore.    She does see an ObGyn to get her pap smears and also her mammograms.  She reports that she has a rash between the folds of her legs which is very itchy.   Current Medications:  Current Outpatient Prescriptions on File Prior to Visit  Medication Sig Dispense Refill  . amLODipine-olmesartan (AZOR) 5-40 MG per tablet Take 1 tablet daily to last til OV on 05/13/2015 and consider change to lower co-pay & more cost effective med 14 tablet 0  . aspirin 81 MG tablet Take 81 mg by mouth daily.    . Biotin 300 MCG TABS Take 1 tablet by mouth at bedtime.     . Cholecalciferol (VITAMIN D3) 2000 UNITS TABS Take 2 tablets by mouth at bedtime.     . cloNIDine (CATAPRES) 0.1 MG tablet Take 0.1 mg by mouth at bedtime.     Marland Kitchen escitalopram (LEXAPRO) 20 MG tablet TAKE 1 TABLET (20  MG TOTAL) BY MOUTH DAILY. 20 tablet 1  . EVENING PRIMROSE OIL PO Take by mouth daily.    . hydrochlorothiazide (HYDRODIURIL) 25 MG tablet TAKE ONE TABLET BY MOUTH DAILY 90 tablet 0  . latanoprost (XALATAN) 0.005 % ophthalmic solution Place 1 drop into both eyes at bedtime.    Marland Kitchen loratadine (CLARITIN) 10 MG tablet Take 10 mg by mouth daily as needed for allergies.     . Magnesium 250 MG TABS Take 1 tablet by mouth 2 (two) times daily.     . meloxicam (MOBIC) 15 MG tablet TAKE 1 TABLET (15 MG TOTAL) BY MOUTH DAILY. 30 tablet 0  . Omega-3 Fatty Acids (OMEGA 3 PO) Take 1 capsule by mouth 2 (two)  times daily.     . pravastatin (PRAVACHOL) 40 MG tablet TAKE 1 TABLET (40 MG TOTAL) BY MOUTH DAILY. 30 tablet 3   No current facility-administered medications on file prior to visit.    Health Maintenance:   Immunization History  Administered Date(s) Administered  . Pneumococcal-Unspecified 12/11/2008  . Td 12/11/2005   Patient does see an eye doctor.   She does see a Education officer, community and goes twice yearly.  Patient Care Team: Lucky Cowboy, MD as PCP - General (Internal Medicine) Louis Meckel, MD as Consulting Physician (Gastroenterology) Shea Evans, MD as Consulting Physician (Obstetrics and Gynecology) Nadara Mustard, MD as Consulting Physician (Orthopedic Surgery)  Allergies:  Allergies  Allergen Reactions  . 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 Clinoril [Sulindac] Rash    Medical History:  Past Medical History  Diagnosis Date  . Hypertension   . Hyperlipemia   . Depression   . GERD (gastroesophageal reflux disease)   . Esophageal stricture   . Unspecified vitamin D deficiency   . Anxiety   . Allergy     Surgical History:  Past Surgical History  Procedure Laterality Date  . Cholecystectomy    . Esophageal dilation  2009  . Leep  2004    Family History:  Family History  Problem Relation Age of Onset  . Colon cancer Neg Hx   . Hypertension Mother   . Stroke Mother   . Hyperlipidemia Mother   . Glaucoma Mother   . Diabetes Sister   . Hypertension Sister   . Glaucoma Sister   . Hypertension Brother   . Cancer Sister 75    breast    Social History:  History  Substance Use Topics  . Smoking status: Former Smoker -- 0.50 packs/day for 3 years    Types: Cigarettes    Quit date: 03/08/2002  . Smokeless tobacco: Never Used  . Alcohol Use: No    Review of Systems: Review of Systems  Constitutional: Negative for fever, chills and malaise/fatigue.  HENT: Positive for congestion and sore throat. Negative for  ear pain.   Eyes: Negative.   Respiratory: Positive for wheezing. Negative for cough and shortness of breath.   Cardiovascular: Negative for chest pain, palpitations and leg swelling.  Gastrointestinal: Negative for heartburn, nausea, vomiting, diarrhea, constipation, blood in stool and melena.  Genitourinary: Negative for dysuria, urgency and frequency.  Skin: Positive for rash.  Neurological: Negative for dizziness, sensory change, loss of consciousness and headaches.  Psychiatric/Behavioral: Negative for depression. The patient is not nervous/anxious and does not have insomnia.     Physical Exam: Estimated body mass index is 29.06 kg/(m^2) as calculated from the following:   Height as  of this encounter: 5\' 3"  (1.6 m).   Weight as of this encounter: 164 lb (74.39 kg). BP 156/80 mmHg  Pulse 94  Temp(Src) 98.2 F (36.8 C) (Temporal)  Resp 16  Ht 5\' 3"  (1.6 m)  Wt 164 lb (74.39 kg)  BMI 29.06 kg/m2  General Appearance: Well nourished well developed, in no apparent distress.  Eyes: PERRLA, EOMs, conjunctiva no swelling or erythema ENT/Mouth: Ear canals normal without obstruction, swelling, erythema, or discharge.  TMs normal bilaterally with no erythema, bulging, retraction, or loss of landmark.  Oropharynx moist and clear with no exudate, erythema, or swelling.   Neck: Supple, thyroid normal. No bruits.  No cervical adenopathy Respiratory: Respiratory effort normal, Breath sounds clear A&P without wheeze, rhonchi, rales.   Cardio: RRR without murmurs, rubs or gallops. Brisk peripheral pulses without edema.  Chest: symmetric, with normal excursions Breasts: Symmetric, without lumps, nipple discharge, retractions.  Abdomen: Soft, nontender, no guarding, rebound, hernias, masses, or organomegaly.  Lymphatics: Non tender without lymphadenopathy.  Musculoskeletal: Full ROM all peripheral extremities,5/5 strength, and normal gait.  Skin: Warm, dry without rashes, lesions, ecchymosis.   Erythematous rash in the bilateral groin folds Neuro: Awake and oriented X 3, Cranial nerves intact, reflexes equal bilaterally. Normal muscle tone, no cerebellar symptoms. Sensation intact.  Psych:  normal affect, Insight and Judgment appropriate.   EKG: WNL no changes.  AORTA SCAN: WNL   Over 40 minutes of exam, counseling, chart review and critical decision making was performed  FORCUCCI, Ayde Record 10:36 AM [facility]

## 2015-05-13 NOTE — Patient Instructions (Signed)

## 2015-05-14 LAB — INSULIN, RANDOM: Insulin: 23.7 u[IU]/mL — ABNORMAL HIGH (ref 2.0–19.6)

## 2015-05-14 LAB — URINALYSIS, ROUTINE W REFLEX MICROSCOPIC
Bilirubin Urine: NEGATIVE
Glucose, UA: NEGATIVE mg/dL
Hgb urine dipstick: NEGATIVE
Ketones, ur: NEGATIVE mg/dL
Leukocytes, UA: NEGATIVE
NITRITE: NEGATIVE
PH: 7 (ref 5.0–8.0)
Protein, ur: NEGATIVE mg/dL
SPECIFIC GRAVITY, URINE: 1.013 (ref 1.005–1.030)
Urobilinogen, UA: 0.2 mg/dL (ref 0.0–1.0)

## 2015-05-14 LAB — MICROALBUMIN / CREATININE URINE RATIO
Creatinine, Urine: 75.8 mg/dL
Microalb Creat Ratio: 7.9 mg/g (ref 0.0–30.0)
Microalb, Ur: 0.6 mg/dL (ref <2.0)

## 2015-05-14 LAB — VITAMIN D 25 HYDROXY (VIT D DEFICIENCY, FRACTURES): Vit D, 25-Hydroxy: 55 ng/mL (ref 30–100)

## 2015-06-07 ENCOUNTER — Other Ambulatory Visit: Payer: Self-pay | Admitting: Podiatry

## 2015-07-29 ENCOUNTER — Ambulatory Visit: Payer: Managed Care, Other (non HMO) | Admitting: Podiatry

## 2015-08-05 ENCOUNTER — Other Ambulatory Visit: Payer: Self-pay | Admitting: *Deleted

## 2015-08-05 MED ORDER — PRAVASTATIN SODIUM 40 MG PO TABS: ORAL_TABLET | ORAL | Status: DC

## 2015-08-30 ENCOUNTER — Encounter: Payer: Self-pay | Admitting: Internal Medicine

## 2015-08-30 ENCOUNTER — Ambulatory Visit (INDEPENDENT_AMBULATORY_CARE_PROVIDER_SITE_OTHER): Payer: Managed Care, Other (non HMO) | Admitting: Internal Medicine

## 2015-08-30 VITALS — BP 132/80 | HR 82 | Temp 98.0°F | Resp 18 | Ht 63.0 in | Wt 161.0 lb

## 2015-08-30 DIAGNOSIS — N6019 Diffuse cystic mastopathy of unspecified breast: Secondary | ICD-10-CM | POA: Diagnosis not present

## 2015-08-30 DIAGNOSIS — R7303 Prediabetes: Secondary | ICD-10-CM

## 2015-08-30 DIAGNOSIS — R7309 Other abnormal glucose: Secondary | ICD-10-CM | POA: Diagnosis not present

## 2015-08-30 DIAGNOSIS — E785 Hyperlipidemia, unspecified: Secondary | ICD-10-CM

## 2015-08-30 DIAGNOSIS — E559 Vitamin D deficiency, unspecified: Secondary | ICD-10-CM | POA: Diagnosis not present

## 2015-08-30 DIAGNOSIS — Z23 Encounter for immunization: Secondary | ICD-10-CM | POA: Diagnosis not present

## 2015-08-30 DIAGNOSIS — I10 Essential (primary) hypertension: Secondary | ICD-10-CM | POA: Diagnosis not present

## 2015-08-30 DIAGNOSIS — Z79899 Other long term (current) drug therapy: Secondary | ICD-10-CM | POA: Diagnosis not present

## 2015-08-30 LAB — CBC WITH DIFFERENTIAL/PLATELET
BASOS ABS: 0 10*3/uL (ref 0.0–0.1)
Basophils Relative: 0 % (ref 0–1)
EOS ABS: 0.1 10*3/uL (ref 0.0–0.7)
Eosinophils Relative: 2 % (ref 0–5)
HCT: 41.5 % (ref 36.0–46.0)
HEMOGLOBIN: 14.5 g/dL (ref 12.0–15.0)
LYMPHS ABS: 2.2 10*3/uL (ref 0.7–4.0)
Lymphocytes Relative: 40 % (ref 12–46)
MCH: 32.6 pg (ref 26.0–34.0)
MCHC: 34.9 g/dL (ref 30.0–36.0)
MCV: 93.3 fL (ref 78.0–100.0)
MONOS PCT: 9 % (ref 3–12)
MPV: 9.6 fL (ref 8.6–12.4)
Monocytes Absolute: 0.5 10*3/uL (ref 0.1–1.0)
Neutro Abs: 2.6 10*3/uL (ref 1.7–7.7)
Neutrophils Relative %: 49 % (ref 43–77)
Platelets: 198 10*3/uL (ref 150–400)
RBC: 4.45 MIL/uL (ref 3.87–5.11)
RDW: 13.7 % (ref 11.5–15.5)
WBC: 5.4 10*3/uL (ref 4.0–10.5)

## 2015-08-30 LAB — HEMOGLOBIN A1C
HEMOGLOBIN A1C: 5.9 % — AB (ref <5.7)
MEAN PLASMA GLUCOSE: 123 mg/dL — AB (ref <117)

## 2015-08-30 LAB — HEPATIC FUNCTION PANEL
ALK PHOS: 87 U/L (ref 33–130)
ALT: 17 U/L (ref 6–29)
AST: 17 U/L (ref 10–35)
Albumin: 4.5 g/dL (ref 3.6–5.1)
BILIRUBIN TOTAL: 0.3 mg/dL (ref 0.2–1.2)
Bilirubin, Direct: 0.1 mg/dL (ref <=0.2)
Indirect Bilirubin: 0.2 mg/dL (ref 0.2–1.2)
TOTAL PROTEIN: 7 g/dL (ref 6.1–8.1)

## 2015-08-30 LAB — BASIC METABOLIC PANEL WITH GFR
BUN: 18 mg/dL (ref 7–25)
CHLORIDE: 103 mmol/L (ref 98–110)
CO2: 31 mmol/L (ref 20–31)
CREATININE: 0.89 mg/dL (ref 0.50–1.05)
Calcium: 9.9 mg/dL (ref 8.6–10.4)
GFR, Est African American: 83 mL/min (ref >=60)
GFR, Est Non African American: 72 mL/min (ref >=60)
GLUCOSE: 103 mg/dL — AB (ref 65–99)
POTASSIUM: 3.7 mmol/L (ref 3.5–5.3)
Sodium: 144 mmol/L (ref 135–146)

## 2015-08-30 LAB — LIPID PANEL
CHOL/HDL RATIO: 3.2 ratio (ref <=5.0)
Cholesterol: 186 mg/dL (ref 125–200)
HDL: 59 mg/dL (ref >=46)
LDL Cholesterol: 112 mg/dL (ref <130)
Triglycerides: 77 mg/dL (ref <150)
VLDL: 15 mg/dL (ref <30)

## 2015-08-30 LAB — TSH: TSH: 1.305 u[IU]/mL (ref 0.350–4.500)

## 2015-08-30 LAB — MAGNESIUM: Magnesium: 2 mg/dL (ref 1.5–2.5)

## 2015-08-30 MED ORDER — ALBUTEROL SULFATE HFA 108 (90 BASE) MCG/ACT IN AERS: 1.0000 | INHALATION_SPRAY | Freq: Four times a day (QID) | RESPIRATORY_TRACT | Status: DC | PRN

## 2015-08-30 MED ORDER — BENZONATATE 200 MG PO CAPS: 200.0000 mg | ORAL_CAPSULE | Freq: Three times a day (TID) | ORAL | Status: DC | PRN

## 2015-08-30 MED ORDER — AZELASTINE HCL 0.1 % NA SOLN: 2.0000 | Freq: Two times a day (BID) | NASAL | Status: DC

## 2015-08-30 NOTE — Patient Instructions (Signed)
Albuterol inhalation aerosol °What is this medicine? °ALBUTEROL (al BYOO ter ole) is a bronchodilator. It helps open up the airways in your lungs to make it easier to breathe. This medicine is used to treat and to prevent bronchospasm. °This medicine may be used for other purposes; ask your health care provider or pharmacist if you have questions. °COMMON BRAND NAME(S): Proair HFA, Proventil, Proventil HFA, Respirol, Ventolin, Ventolin HFA °What should I tell my health care provider before I take this medicine? °They need to know if you have any of the following conditions: °-diabetes °-heart disease or irregular heartbeat °-high blood pressure °-pheochromocytoma °-seizures °-thyroid disease °-an unusual or allergic reaction to albuterol, levalbuterol, sulfites, other medicines, foods, dyes, or preservatives °-pregnant or trying to get pregnant °-breast-feeding °How should I use this medicine? °This medicine is for inhalation through the mouth. Follow the directions on your prescription label. Take your medicine at regular intervals. Do not use more often than directed. Make sure that you are using your inhaler correctly. Ask you doctor or health care provider if you have any questions. °Talk to your pediatrician regarding the use of this medicine in children. Special care may be needed. °Overdosage: If you think you have taken too much of this medicine contact a poison control center or emergency room at once. °NOTE: This medicine is only for you. Do not share this medicine with others. °What if I miss a dose? °If you miss a dose, use it as soon as you can. If it is almost time for your next dose, use only that dose. Do not use double or extra doses. °What may interact with this medicine? °-anti-infectives like chloroquine and pentamidine °-caffeine °-cisapride °-diuretics °-medicines for colds °-medicines for depression or for emotional or psychotic conditions °-medicines for weight loss including some herbal  products °-methadone °-some antibiotics like clarithromycin, erythromycin, levofloxacin, and linezolid °-some heart medicines °-steroid hormones like dexamethasone, cortisone, hydrocortisone °-theophylline °-thyroid hormones °This list may not describe all possible interactions. Give your health care provider a list of all the medicines, herbs, non-prescription drugs, or dietary supplements you use. Also tell them if you smoke, drink alcohol, or use illegal drugs. Some items may interact with your medicine. °What should I watch for while using this medicine? °Tell your doctor or health care professional if your symptoms do not improve. Do not use extra albuterol. If your asthma or bronchitis gets worse while you are using this medicine, call your doctor right away. °If your mouth gets dry try chewing sugarless gum or sucking hard candy. Drink water as directed. °What side effects may I notice from receiving this medicine? °Side effects that you should report to your doctor or health care professional as soon as possible: °-allergic reactions like skin rash, itching or hives, swelling of the face, lips, or tongue °-breathing problems °-chest pain °-feeling faint or lightheaded, falls °-high blood pressure °-irregular heartbeat °-fever °-muscle cramps or weakness °-pain, tingling, numbness in the hands or feet °-vomiting °Side effects that usually do not require medical attention (report to your doctor or health care professional if they continue or are bothersome): °-cough °-difficulty sleeping °-headache °-nervousness or trembling °-stomach upset °-stuffy or runny nose °-throat irritation °-unusual taste °This list may not describe all possible side effects. Call your doctor for medical advice about side effects. You may report side effects to FDA at 1-800-FDA-1088. °Where should I keep my medicine? °Keep out of the reach of children. °Store at room temperature between 15 and 30 degrees   C (59 and 86 degrees F). The  contents are under pressure and may burst when exposed to heat or flame. Do not freeze. This medicine does not work as well if it is too cold. Throw away any unused medicine after the expiration date. Inhalers need to be thrown away after the labeled number of puffs have been used or by the expiration date; whichever comes first. Ventolin HFA should be thrown away 12 months after removing from foil pouch. Check the instructions that come with your medicine. °NOTE: This sheet is a summary. It may not cover all possible information. If you have questions about this medicine, talk to your doctor, pharmacist, or health care provider. °© 2015, [employer]. (2013-05-15 10:57:17) ° ° °

## 2015-08-30 NOTE — Progress Notes (Signed)
Patient ID: Kaitlyn [name], female   DOB: Apr 19, 1956, 59 y.o.   MRN: [id number]  Assessment and Plan:  Hypertension:  -Continue medication,  -monitor blood pressure at home.  -Continue DASH diet.   -Reminder to go to the ER if any CP, SOB, nausea, dizziness, severe HA, changes vision/speech, left arm numbness and tingling, and jaw pain.  Cholesterol: -Continue diet and exercise.  -Check cholesterol.   Pre-diabetes: -Continue diet and exercise.  -Check A1C  Vitamin D Def: -check level -continue medications.   Depression -getting worse -counseling advised and exercise -if no relief buspar/wellbutrin -consider lamictal   Allergies -astelin refilled -albuterol -tessalon -cont other meds  Continue diet and meds as discussed. Further disposition pending results of labs.  HPI 59 y.o. female  presents for 3 month follow up with hypertension, hyperlipidemia, prediabetes and vitamin D.   Her blood pressure has been controlled at home, today their BP is BP: 132/80 mmHg.   She does not workout. She denies chest pain, shortness of breath, dizziness.   She is on cholesterol medication and denies myalgias. Her cholesterol is not at goal. The cholesterol last visit was:   Lab Results  Component Value Date   CHOL 204* 05/13/2015   HDL 58 05/13/2015   LDLCALC 110* 05/13/2015   TRIG 180* 05/13/2015   CHOLHDL 3.5 05/13/2015     She has been working on diet and exercise for prediabetes, and denies foot ulcerations, hyperglycemia, hypoglycemia , increased appetite, nausea, paresthesia of the feet, polydipsia, polyuria, visual disturbances, vomiting and weight loss. Last A1C in the office was:  Lab Results  Component Value Date   HGBA1C 5.8* 05/13/2015    Patient is on Vitamin D supplement.  Lab Results  Component Value Date   VD25OH 24 05/13/2015     She has joined [employer].  She plans on walking and riding the bicycle there.  She is still doing [employer].     Current  Medications:  Current Outpatient Prescriptions on File Prior to Visit  Medication Sig Dispense Refill  . amLODipine (NORVASC) 5 MG tablet Take 1 tablet (5 mg total) by mouth daily. 30 tablet 11  . aspirin 81 MG tablet Take 81 mg by mouth daily.    . Biotin 300 MCG TABS Take 1 tablet by mouth at bedtime.     . Cholecalciferol (VITAMIN D3) 2000 UNITS TABS Take 2 tablets by mouth at bedtime.     . cloNIDine (CATAPRES) 0.1 MG tablet Take 0.1 mg by mouth at bedtime.     Marland Kitchen escitalopram (LEXAPRO) 20 MG tablet TAKE 1 TABLET (20 MG TOTAL) BY MOUTH DAILY. 20 tablet 1  . EVENING PRIMROSE OIL PO Take by mouth daily.    . hydrochlorothiazide (HYDRODIURIL) 25 MG tablet TAKE ONE TABLET BY MOUTH DAILY 90 tablet 0  . latanoprost (XALATAN) 0.005 % ophthalmic solution Place 1 drop into both eyes at bedtime.    Marland Kitchen loratadine (CLARITIN) 10 MG tablet Take 10 mg by mouth daily as needed for allergies.     . Magnesium 250 MG TABS Take 1 tablet by mouth 2 (two) times daily.     . Omega-3 Fatty Acids (OMEGA 3 PO) Take 1 capsule by mouth 2 (two) times daily.     . pravastatin (PRAVACHOL) 40 MG tablet TAKE 1 TABLET (40 MG TOTAL) BY MOUTH DAILY. 30 tablet 3   No current facility-administered medications on file prior to visit.    Medical History:  Past Medical History  Diagnosis Date  .  Hypertension   . Hyperlipemia   . Depression   . GERD (gastroesophageal reflux disease)   . Esophageal stricture   . Unspecified vitamin D deficiency   . Anxiety   . Allergy     Allergies:  Allergies  Allergen Reactions  . 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 Clinoril [Sulindac] Rash     Review of Systems:  Review of Systems  Constitutional: Negative for fever, chills and malaise/fatigue.  HENT: Positive for congestion. Negative for ear pain and sore throat.   Eyes: Negative.   Respiratory: Positive for cough and wheezing. Negative for shortness of breath.   Cardiovascular:  Negative for chest pain, palpitations and leg swelling.  Gastrointestinal: Negative for heartburn, diarrhea, constipation, blood in stool and melena.  Genitourinary: Negative.   Neurological: Negative for dizziness, sensory change, loss of consciousness and headaches.  Psychiatric/Behavioral: Negative for depression. The patient is not nervous/anxious and does not have insomnia.     Family history- Review and unchanged  Social history- Review and unchanged  Physical Exam: BP 132/80 mmHg  Pulse 82  Temp(Src) 98 F (36.7 C) (Temporal)  Resp 18  Ht  (1.6 m)  Wt 161 lb (73.029 kg)  BMI 28.53 kg/m2 Wt Readings from Last 3 Encounters:  08/30/15 161 lb (73.029 kg)  05/13/15 164 lb (74.39 kg)  02/21/15 154 lb (69.854 kg)    General Appearance: Well nourished well developed, in no apparent distress. Eyes: PERRLA, EOMs, conjunctiva no swelling or erythema ENT/Mouth: Ear canals normal without obstruction, swelling, erythma, discharge.  TMs normal bilaterally.  Oropharynx moist, clear, without exudate, or postoropharyngeal swelling. Neck: Supple, thyroid normal,no cervical adenopathy  Respiratory: Respiratory effort normal, Breath sounds clear A&P without rhonchi, wheeze, or rale.  No retractions, no accessory usage. Cardio: RRR with no MRGs. Brisk peripheral pulses without edema.  Abdomen: Soft, + BS,  Non tender, no guarding, rebound, hernias, masses. Musculoskeletal: Full ROM, 5/5 strength, Normal gait Skin: Warm, dry without rashes, lesions, ecchymosis.  Neuro: Awake and oriented X 3, Cranial nerves intact. Normal muscle tone, no cerebellar symptoms. Psych: Normal affect, Insight and Judgment appropriate.    Terri Piedra, PA-C 11:00 AM [facility]

## 2015-08-31 LAB — VITAMIN D 25 HYDROXY (VIT D DEFICIENCY, FRACTURES): Vit D, 25-Hydroxy: 64 ng/mL (ref 30–100)

## 2015-08-31 LAB — INSULIN, RANDOM: Insulin: 9.7 u[IU]/mL (ref 2.0–19.6)

## 2015-09-06 ENCOUNTER — Other Ambulatory Visit: Payer: Self-pay | Admitting: Internal Medicine

## 2015-12-04 ENCOUNTER — Other Ambulatory Visit: Payer: Self-pay | Admitting: Internal Medicine

## 2015-12-22 ENCOUNTER — Other Ambulatory Visit: Payer: Self-pay

## 2015-12-22 ENCOUNTER — Other Ambulatory Visit: Payer: Self-pay | Admitting: Internal Medicine

## 2015-12-22 DIAGNOSIS — R928 Other abnormal and inconclusive findings on diagnostic imaging of breast: Secondary | ICD-10-CM

## 2015-12-30 ENCOUNTER — Encounter: Payer: Self-pay | Admitting: Internal Medicine

## 2015-12-30 ENCOUNTER — Ambulatory Visit (INDEPENDENT_AMBULATORY_CARE_PROVIDER_SITE_OTHER): Payer: Managed Care, Other (non HMO) | Admitting: Internal Medicine

## 2015-12-30 VITALS — BP 126/70 | HR 74 | Temp 98.0°F | Resp 16 | Ht 63.0 in | Wt 156.0 lb

## 2015-12-30 DIAGNOSIS — Z79899 Other long term (current) drug therapy: Secondary | ICD-10-CM | POA: Diagnosis not present

## 2015-12-30 DIAGNOSIS — I10 Essential (primary) hypertension: Secondary | ICD-10-CM

## 2015-12-30 DIAGNOSIS — E559 Vitamin D deficiency, unspecified: Secondary | ICD-10-CM | POA: Diagnosis not present

## 2015-12-30 DIAGNOSIS — E785 Hyperlipidemia, unspecified: Secondary | ICD-10-CM

## 2015-12-30 DIAGNOSIS — Z23 Encounter for immunization: Secondary | ICD-10-CM | POA: Diagnosis not present

## 2015-12-30 LAB — CBC WITH DIFFERENTIAL/PLATELET
BASOS ABS: 0 10*3/uL (ref 0.0–0.1)
Basophils Relative: 0 % (ref 0–1)
EOS PCT: 3 % (ref 0–5)
Eosinophils Absolute: 0.1 10*3/uL (ref 0.0–0.7)
HEMATOCRIT: 41.8 % (ref 36.0–46.0)
Hemoglobin: 14 g/dL (ref 12.0–15.0)
LYMPHS ABS: 1.8 10*3/uL (ref 0.7–4.0)
LYMPHS PCT: 39 % (ref 12–46)
MCH: 31.9 pg (ref 26.0–34.0)
MCHC: 33.5 g/dL (ref 30.0–36.0)
MCV: 95.2 fL (ref 78.0–100.0)
MPV: 9.6 fL (ref 8.6–12.4)
Monocytes Absolute: 0.5 10*3/uL (ref 0.1–1.0)
Monocytes Relative: 10 % (ref 3–12)
NEUTROS PCT: 48 % (ref 43–77)
Neutro Abs: 2.3 10*3/uL (ref 1.7–7.7)
PLATELETS: 193 10*3/uL (ref 150–400)
RBC: 4.39 MIL/uL (ref 3.87–5.11)
RDW: 13.5 % (ref 11.5–15.5)
WBC: 4.7 10*3/uL (ref 4.0–10.5)

## 2015-12-30 LAB — LIPID PANEL
CHOLESTEROL: 172 mg/dL (ref 125–200)
HDL: 53 mg/dL (ref >=46)
LDL Cholesterol: 93 mg/dL (ref <130)
TRIGLYCERIDES: 128 mg/dL (ref <150)
Total CHOL/HDL Ratio: 3.2 Ratio (ref <=5.0)
VLDL: 26 mg/dL (ref <30)

## 2015-12-30 LAB — HEPATIC FUNCTION PANEL
ALBUMIN: 4.4 g/dL (ref 3.6–5.1)
ALT: 15 U/L (ref 6–29)
AST: 17 U/L (ref 10–35)
Alkaline Phosphatase: 77 U/L (ref 33–130)
Bilirubin, Direct: 0.1 mg/dL (ref <=0.2)
Indirect Bilirubin: 0.3 mg/dL (ref 0.2–1.2)
TOTAL PROTEIN: 6.9 g/dL (ref 6.1–8.1)
Total Bilirubin: 0.4 mg/dL (ref 0.2–1.2)

## 2015-12-30 LAB — BASIC METABOLIC PANEL WITH GFR
BUN: 15 mg/dL (ref 7–25)
CALCIUM: 10.3 mg/dL (ref 8.6–10.4)
CO2: 31 mmol/L (ref 20–31)
CREATININE: 0.95 mg/dL (ref 0.50–1.05)
Chloride: 103 mmol/L (ref 98–110)
GFR, Est African American: 77 mL/min (ref >=60)
GFR, Est Non African American: 67 mL/min (ref >=60)
GLUCOSE: 99 mg/dL (ref 65–99)
Potassium: 3.5 mmol/L (ref 3.5–5.3)
SODIUM: 144 mmol/L (ref 135–146)

## 2015-12-30 MED ORDER — AZELASTINE HCL 0.1 % NA SOLN: 2.0000 | Freq: Two times a day (BID) | NASAL | Status: DC

## 2015-12-30 MED ORDER — ESCITALOPRAM OXALATE 20 MG PO TABS: 20.0000 mg | ORAL_TABLET | Freq: Every day | ORAL | Status: DC

## 2015-12-30 MED ORDER — BENZONATATE 200 MG PO CAPS: 200.0000 mg | ORAL_CAPSULE | Freq: Three times a day (TID) | ORAL | Status: DC | PRN

## 2015-12-30 MED ORDER — CLONIDINE HCL 0.1 MG PO TABS
0.1000 mg | ORAL_TABLET | Freq: Every day | ORAL | Status: DC
Start: 2015-12-30 — End: 2016-05-22

## 2015-12-30 MED ORDER — HYDROCHLOROTHIAZIDE 25 MG PO TABS: 25.0000 mg | ORAL_TABLET | Freq: Every day | ORAL | Status: DC

## 2015-12-30 MED ORDER — PRAVASTATIN SODIUM 40 MG PO TABS: ORAL_TABLET | ORAL | Status: DC

## 2015-12-30 NOTE — Addendum Note (Signed)
Addended by: Samarion Ehle A on: 12/30/2015 11:18 AM   Modules accepted: Orders

## 2015-12-30 NOTE — Progress Notes (Signed)
Patient ID: Kaitlyn [name], female   DOB: 04/24/1956, 60 y.o.   MRN: [id number]  Assessment and Plan:  Hypertension:  -Continue medication,  -monitor blood pressure at home.  -Continue DASH diet.   -Reminder to go to the ER if any CP, SOB, nausea, dizziness, severe HA, changes vision/speech, left arm numbness and tingling, and jaw pain.  Cholesterol: -Continue diet and exercise.  -Check cholesterol.   Pre-diabetes: -Continue diet and exercise.  -Check A1C  Vitamin D Def: -check level -continue medications.   Allergic rhinitis -astelin spray -claritin -nasal saline  Continue diet and meds as discussed. Further disposition pending results of labs.  HPI 60 y.o. female  presents for 3 month follow up with hypertension, hyperlipidemia, prediabetes and vitamin D.   Her blood pressure has been controlled at home, today their BP is BP: 126/70 mmHg.   She does not workout. She denies chest pain, shortness of breath, dizziness.  She is not exercising due to time constraints.     She is on cholesterol medication and denies myalgias. Her cholesterol is at goal. The cholesterol last visit was:   Lab Results  Component Value Date   CHOL 186 08/30/2015   HDL 59 08/30/2015   LDLCALC 112 08/30/2015   TRIG 77 08/30/2015   CHOLHDL 3.2 08/30/2015     She has been working on diet and exercise for prediabetes, and denies foot ulcerations, hyperglycemia, hypoglycemia , increased appetite, nausea, paresthesia of the feet, polydipsia, polyuria, visual disturbances, vomiting and weight loss. Last A1C in the office was:  Lab Results  Component Value Date   HGBA1C 5.9* 08/30/2015    Patient is on Vitamin D supplement.  Lab Results  Component Value Date   VD25OH 64 08/30/2015      Current Medications:  Current Outpatient Prescriptions on File Prior to Visit  Medication Sig Dispense Refill  . albuterol (PROVENTIL HFA;VENTOLIN HFA) 108 (90 BASE) MCG/ACT inhaler Inhale 1-2 puffs into the lungs  every 6 (six) hours as needed for wheezing or shortness of breath (cough). 1 Inhaler 0  . amLODipine (NORVASC) 5 MG tablet Take 1 tablet (5 mg total) by mouth daily. 30 tablet 11  . aspirin 81 MG tablet Take 81 mg by mouth daily.    Marland Kitchen azelastine (ASTELIN) 0.1 % nasal spray Place 2 sprays into both nostrils 2 (two) times daily. Use in each nostril as directed 30 mL 2  . Biotin 300 MCG TABS Take 1 tablet by mouth at bedtime.     . Cholecalciferol (VITAMIN D3) 2000 UNITS TABS Take 2 tablets by mouth at bedtime.     . cloNIDine (CATAPRES) 0.1 MG tablet Take 0.1 mg by mouth at bedtime.     Marland Kitchen escitalopram (LEXAPRO) 20 MG tablet TAKE 1 TABLET (20 MG TOTAL) BY MOUTH DAILY. 30 tablet 2  . EVENING PRIMROSE OIL PO Take by mouth daily.    . hydrochlorothiazide (HYDRODIURIL) 25 MG tablet TAKE ONE TABLET BY MOUTH DAILY 90 tablet 0  . latanoprost (XALATAN) 0.005 % ophthalmic solution Place 1 drop into both eyes at bedtime.    Marland Kitchen loratadine (CLARITIN) 10 MG tablet Take 10 mg by mouth daily as needed for allergies.     . Magnesium 250 MG TABS Take 1 tablet by mouth 2 (two) times daily.     . Omega-3 Fatty Acids (OMEGA 3 PO) Take 1 capsule by mouth 2 (two) times daily.     . pravastatin (PRAVACHOL) 40 MG tablet TAKE 1 TABLET (40 MG  TOTAL) BY MOUTH DAILY. 30 tablet 3   No current facility-administered medications on file prior to visit.    Medical History:  Past Medical History  Diagnosis Date  . Hypertension   . Hyperlipemia   . Depression   . GERD (gastroesophageal reflux disease)   . Esophageal stricture   . Unspecified vitamin D deficiency   . Anxiety   . Allergy     Allergies:  Allergies  Allergen Reactions  . 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 Clinoril [Sulindac] Rash     Review of Systems:  Review of Systems  Constitutional: Negative for fever, chills and malaise/fatigue.  HENT: Negative for congestion, ear pain and sore throat.   Eyes:  Negative.   Respiratory: Negative for cough, shortness of breath and wheezing.   Cardiovascular: Negative for chest pain, palpitations and leg swelling.  Gastrointestinal: Negative for heartburn, nausea, diarrhea, constipation, blood in stool and melena.  Genitourinary: Negative.   Skin: Negative.   Neurological: Negative for dizziness, loss of consciousness and headaches.  Psychiatric/Behavioral: Negative for depression. The patient is not nervous/anxious and does not have insomnia.     Family history- Review and unchanged  Social history- Review and unchanged  Physical Exam: BP 126/70 mmHg  Pulse 74  Temp(Src) 98 F (36.7 C) (Temporal)  Resp 16  Ht  (1.6 m)  Wt 156 lb (70.761 kg)  BMI 27.64 kg/m2 Wt Readings from Last 3 Encounters:  12/30/15 156 lb (70.761 kg)  08/30/15 161 lb (73.029 kg)  05/13/15 164 lb (74.39 kg)    General Appearance: Well nourished well developed, in no apparent distress. Eyes: PERRLA, EOMs, conjunctiva no swelling or erythema ENT/Mouth: Ear canals normal without obstruction, swelling, erythma, discharge.  TMs normal bilaterally.  Oropharynx moist, clear, without exudate, or postoropharyngeal swelling. Neck: Supple, thyroid normal,no cervical adenopathy  Respiratory: Respiratory effort normal, Breath sounds clear A&P without rhonchi, wheeze, or rale.  No retractions, no accessory usage. Cardio: RRR with no MRGs. Brisk peripheral pulses without edema.  Abdomen: Soft, + BS,  Non tender, no guarding, rebound, hernias, masses. Musculoskeletal: Full ROM, 5/5 strength, Normal gait Skin: Warm, dry without rashes, lesions, ecchymosis.  Neuro: Awake and oriented X 3, Cranial nerves intact. Normal muscle tone, no cerebellar symptoms. Psych: Normal affect, Insight and Judgment appropriate.    Terri Piedra, PA-C 10:38 AM [facility]

## 2015-12-31 LAB — TSH: TSH: 1.435 u[IU]/mL (ref 0.350–4.500)

## 2016-03-30 ENCOUNTER — Other Ambulatory Visit: Payer: Self-pay | Admitting: Internal Medicine

## 2016-04-04 ENCOUNTER — Ambulatory Visit: Payer: Managed Care, Other (non HMO) | Admitting: Podiatry

## 2016-04-27 ENCOUNTER — Ambulatory Visit (INDEPENDENT_AMBULATORY_CARE_PROVIDER_SITE_OTHER): Payer: Managed Care, Other (non HMO)

## 2016-04-27 ENCOUNTER — Encounter: Payer: Self-pay | Admitting: Podiatry

## 2016-04-27 ENCOUNTER — Ambulatory Visit (INDEPENDENT_AMBULATORY_CARE_PROVIDER_SITE_OTHER): Payer: Managed Care, Other (non HMO) | Admitting: Podiatry

## 2016-04-27 VITALS — BP 125/70 | HR 86 | Resp 16

## 2016-04-27 DIAGNOSIS — M79671 Pain in right foot: Secondary | ICD-10-CM

## 2016-04-27 DIAGNOSIS — M779 Enthesopathy, unspecified: Secondary | ICD-10-CM

## 2016-04-27 MED ORDER — DICLOFENAC SODIUM 75 MG PO TBEC: 75.0000 mg | DELAYED_RELEASE_TABLET | Freq: Two times a day (BID) | ORAL | Status: DC

## 2016-04-27 MED ORDER — TRIAMCINOLONE ACETONIDE 10 MG/ML IJ SUSP
10.0000 mg | Freq: Once | INTRAMUSCULAR | Status: AC
Start: 2016-04-27 — End: 2016-04-27
  Administered 2016-04-27: 10 mg

## 2016-04-27 NOTE — Progress Notes (Signed)
Subjective:     Patient ID: Kaitlyn HusbandsLinda C [name], female   DOB: 02/02/1956, 60 y.o.   MRN: [id number]  HPI patient presents with pain on the inside of the right ankle stating that it's been hurting her now for the last 4 months and she does not remember specific injury   Review of Systems  All other systems reviewed and are negative.      Objective:   Physical Exam  Constitutional: She is oriented to person, place, and time.  Cardiovascular: Intact distal pulses.   Musculoskeletal: Normal range of motion.  Neurological: She is oriented to person, place, and time.  Skin: Skin is warm.  Nursing note and vitals reviewed.  neurovascular status intact muscle strength adequate range of motion within normal limits with patient found to have inflammation and pain on the medial aspect of the right ankle with fluid buildup and pain when palpated. Patient is noted to have no loss of muscle strength and minimal edema and does have good digital perfusion and is well oriented 3     Assessment:     Inflammatory tendinitis of the medial side of the right ankle    Plan:     H&P and x-rays reviewed with patient. Today I did careful sheath injection right did a fascial brace to lift up the medial arch and discussed long-term orthotics when symptoms have reduced. Reappoint 2 weeks or earlier if needed  X-ray report indicates moderate depression of the arch with no indication of fracture or other arthritic condition

## 2016-05-04 ENCOUNTER — Other Ambulatory Visit: Payer: Self-pay | Admitting: Internal Medicine

## 2016-05-04 ENCOUNTER — Ambulatory Visit: Payer: Managed Care, Other (non HMO) | Admitting: Podiatry

## 2016-05-12 ENCOUNTER — Encounter: Payer: Self-pay | Admitting: Internal Medicine

## 2016-05-22 ENCOUNTER — Ambulatory Visit (INDEPENDENT_AMBULATORY_CARE_PROVIDER_SITE_OTHER): Payer: Managed Care, Other (non HMO) | Admitting: Internal Medicine

## 2016-05-22 ENCOUNTER — Encounter: Payer: Self-pay | Admitting: Internal Medicine

## 2016-05-22 VITALS — BP 146/82 | HR 74 | Temp 98.0°F | Resp 16 | Ht 63.0 in | Wt 160.0 lb

## 2016-05-22 DIAGNOSIS — Z0000 Encounter for general adult medical examination without abnormal findings: Secondary | ICD-10-CM

## 2016-05-22 DIAGNOSIS — Z0001 Encounter for general adult medical examination with abnormal findings: Secondary | ICD-10-CM

## 2016-05-22 DIAGNOSIS — Z130 Encounter for screening for diseases of the blood and blood-forming organs and certain disorders involving the immune mechanism: Secondary | ICD-10-CM

## 2016-05-22 DIAGNOSIS — E785 Hyperlipidemia, unspecified: Secondary | ICD-10-CM

## 2016-05-22 DIAGNOSIS — I10 Essential (primary) hypertension: Secondary | ICD-10-CM

## 2016-05-22 DIAGNOSIS — R7303 Prediabetes: Secondary | ICD-10-CM

## 2016-05-22 DIAGNOSIS — Z79899 Other long term (current) drug therapy: Secondary | ICD-10-CM

## 2016-05-22 DIAGNOSIS — E559 Vitamin D deficiency, unspecified: Secondary | ICD-10-CM

## 2016-05-22 DIAGNOSIS — F329 Major depressive disorder, single episode, unspecified: Secondary | ICD-10-CM

## 2016-05-22 DIAGNOSIS — F32A Depression, unspecified: Secondary | ICD-10-CM

## 2016-05-22 DIAGNOSIS — K222 Esophageal obstruction: Secondary | ICD-10-CM

## 2016-05-22 DIAGNOSIS — K219 Gastro-esophageal reflux disease without esophagitis: Secondary | ICD-10-CM

## 2016-05-22 DIAGNOSIS — Z1212 Encounter for screening for malignant neoplasm of rectum: Secondary | ICD-10-CM

## 2016-05-22 LAB — VITAMIN B12: VITAMIN B 12: 523 pg/mL (ref 200–1100)

## 2016-05-22 LAB — CBC WITH DIFFERENTIAL/PLATELET
BASOS ABS: 0 {cells}/uL (ref 0–200)
Basophils Relative: 0 %
EOS ABS: 216 {cells}/uL (ref 15–500)
Eosinophils Relative: 4 %
HEMATOCRIT: 42.1 % (ref 35.0–45.0)
HEMOGLOBIN: 14.2 g/dL (ref 11.7–15.5)
LYMPHS ABS: 2592 {cells}/uL (ref 850–3900)
Lymphocytes Relative: 48 %
MCH: 32.1 pg (ref 27.0–33.0)
MCHC: 33.7 g/dL (ref 32.0–36.0)
MCV: 95.2 fL (ref 80.0–100.0)
MONO ABS: 540 {cells}/uL (ref 200–950)
MPV: 9.7 fL (ref 7.5–12.5)
Monocytes Relative: 10 %
NEUTROS PCT: 38 %
Neutro Abs: 2052 cells/uL (ref 1500–7800)
Platelets: 188 10*3/uL (ref 140–400)
RBC: 4.42 MIL/uL (ref 3.80–5.10)
RDW: 13.4 % (ref 11.0–15.0)
WBC: 5.4 10*3/uL (ref 3.8–10.8)

## 2016-05-22 LAB — HEMOGLOBIN A1C
HEMOGLOBIN A1C: 5.5 % (ref <5.7)
Mean Plasma Glucose: 111 mg/dL

## 2016-05-22 LAB — TSH: TSH: 0.98 m[IU]/L

## 2016-05-22 MED ORDER — HYDROCHLOROTHIAZIDE 25 MG PO TABS: ORAL_TABLET | ORAL | Status: DC

## 2016-05-22 MED ORDER — BUDESONIDE-FORMOTEROL FUMARATE 80-4.5 MCG/ACT IN AERO: 2.0000 | INHALATION_SPRAY | Freq: Two times a day (BID) | RESPIRATORY_TRACT | Status: DC

## 2016-05-22 MED ORDER — CLONIDINE HCL 0.1 MG PO TABS: 0.1000 mg | ORAL_TABLET | Freq: Every day | ORAL | Status: DC

## 2016-05-22 MED ORDER — PRAVASTATIN SODIUM 40 MG PO TABS: ORAL_TABLET | ORAL | Status: DC

## 2016-05-22 NOTE — Progress Notes (Signed)
Complete Physical  Assessment and Plan:   1. Encounter for general adult medical examination with abnormal findings  - CBC with Differential/Platelet - BASIC METABOLIC PANEL WITH GFR - Hepatic function panel - Magnesium  2. Essential hypertension -cont meds -well controlled at home - Urinalysis, Routine w reflex microscopic (not at [facility]) - Microalbumin / creatinine urine ratio - EKG 12-Lead - [place], RETROPERITNL ABD,  LTD - TSH  3. ESOPHAGEAL STRICTURE -may need to restart PPI given chronic cough  4. Gastroesophageal reflux disease, esophagitis presence not specified -may need to restart PPI again  5. Hyperlipemia -cont meds - Lipid panel  6. Vitamin D deficiency -cont supplement - VITAMIN D 25 Hydroxy (Vit-D Deficiency, Fractures)  7. Depression -cont meds  8. Medication management -cont quarterly labs  9. Prediabetes  - Hemoglobin A1c - Insulin, random  10. Screening for deficiency anemia  - Iron and TIBC - Vitamin B12  11. Screening for rectal cancer  - POC Hemoccult Bld/Stl (3-Cd Home Screen); Future  12.  Chronic cough -try symbicort twice daily -albuterol prn -if no relief than go ahead and use flonase or restart PPI   Discussed med's effects and SE's. Screening labs and tests as requested with regular follow-up as recommended.  HPI  60 y.o. female  presents for a complete physical.  Her blood pressure has been controlled at home, today their BP is BP: (!) 146/82 mmHg.  She does not workout. She denies chest pain, shortness of breath, dizziness.   She is on cholesterol medication and denies myalgias. Her cholesterol is at goal. The cholesterol last visit was:  Lab Results  Component Value Date   CHOL 172 12/30/2015   HDL 53 12/30/2015   LDLCALC 93 12/30/2015   TRIG 128 12/30/2015   CHOLHDL 3.2 12/30/2015  .  She has been working on diet and exercise for prediabetes, she is not on bASA, she is on ACE/ARB and denies foot ulcerations,  hyperglycemia, hypoglycemia , increased appetite, nausea, paresthesia of the feet, polydipsia, polyuria, visual disturbances, vomiting and weight loss. Last A1C in the office was:  Lab Results  Component Value Date   HGBA1C 5.9* 08/30/2015    Patient is on Vitamin D supplement.   Lab Results  Component Value Date   VD25OH 64 08/30/2015     Patient notes that she is having a headache almost daily.  She started getting them daily when she was taking a pill for her foot and when she uses her inhaler.  She wakes up in the morning with them.  She notes that this does respond slightly to some.  She also notes that she is coughing a lot at nighttime.  She uses her inhaler once per day.  She notes that she does not have DOE and can shop without any shortness of breath.    She has been following with Dr. Charlsie Merles and had some injections her foot.  She is supposed to get some orthotics to help with her feet.    Current Medications:  Current Outpatient Prescriptions on File Prior to Visit  Medication Sig Dispense Refill  . albuterol (PROVENTIL HFA;VENTOLIN HFA) 108 (90 BASE) MCG/ACT inhaler Inhale 1-2 puffs into the lungs every 6 (six) hours as needed for wheezing or shortness of breath (cough). 1 Inhaler 0  . amLODipine (NORVASC) 5 MG tablet TAKE 1 TABLET (5 MG TOTAL) BY MOUTH DAILY. 30 tablet 1  . aspirin 81 MG tablet Take 81 mg by mouth daily.    Marland Kitchen azelastine (  ASTELIN) 0.1 % nasal spray PLACE 2 SPRAYS INTO BOTH NOSTRILS 2 (TWO) TIMES DAILY. USE IN EACH NOSTRIL AS DIRECTED 30 mL 1  . benzonatate (TESSALON) 200 MG capsule Take 1 capsule (200 mg total) by mouth 3 (three) times daily as needed for cough. 30 capsule 1  . Biotin 300 MCG TABS Take 1 tablet by mouth at bedtime.     . Cholecalciferol (VITAMIN D3) 2000 UNITS TABS Take 2 tablets by mouth at bedtime.     . diclofenac (VOLTAREN) 75 MG EC tablet Take 1 tablet (75 mg total) by mouth 2 (two) times daily. 50 tablet 2  . escitalopram (LEXAPRO) 20 MG  tablet Take 1 tablet (20 mg total) by mouth daily. 90 tablet 2  . EVENING PRIMROSE OIL PO Take by mouth daily.    Marland Kitchen. latanoprost (XALATAN) 0.005 % ophthalmic solution Place 1 drop into both eyes at bedtime.    Marland Kitchen. loratadine (CLARITIN) 10 MG tablet Take 10 mg by mouth daily as needed for allergies.     . Magnesium 250 MG TABS Take 1 tablet by mouth 2 (two) times daily.     . Omega-3 Fatty Acids (OMEGA 3 PO) Take 1 capsule by mouth 2 (two) times daily.      No current facility-administered medications on file prior to visit.    Health Maintenance:   Immunization History  Administered Date(s) Administered  . Influenza Split 08/30/2015  . Pneumococcal-Unspecified 12/11/2008  . Td 12/11/2005  . Tdap 12/30/2015    Tetanus:2017 Pneumovax: 2010 Flu vaccine: 2016 LMP: postmenopausal Pap:2013, followed by Dr. Juliene PinaMody [id number]:2016  DEXA:Deferred to Obgyn Colonoscopy:2011 [id number]:2015 Last Dental Exam: 2017 Last Eye Exam: 2017  Patient Care Team: Lucky CowboyWilliam McKeown, MD as PCP - General (Internal Medicine) Louis Meckelobert D Kaplan, MD as Consulting Physician (Gastroenterology) Shea EvansVaishali Mody, MD as Consulting Physician (Obstetrics and Gynecology) Nadara MustardMarcus V Duda, MD as Consulting Physician (Orthopedic Surgery)  Allergies:  Allergies  Allergen Reactions  . 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 Clinoril [Sulindac] Rash    Medical History:  Past Medical History  Diagnosis Date  . Hypertension   . Hyperlipemia   . Depression   . GERD (gastroesophageal reflux disease)   . Esophageal stricture   . Unspecified vitamin D deficiency   . Anxiety   . Allergy     Surgical History:  Past Surgical History  Procedure Laterality Date  . Cholecystectomy    . Esophageal dilation  2009  . Leep  2004    Family History:  Family History  Problem Relation Age of Onset  . Colon cancer Neg Hx   . Hypertension Mother   . Stroke Mother   . Hyperlipidemia Mother   .  Glaucoma Mother   . Diabetes Sister   . Hypertension Sister   . Glaucoma Sister   . Hypertension Brother   . Cancer Sister [id number]    breast    Social History:  Social History  Substance Use Topics  . Smoking status: Former Smoker -- 0.50 packs/day for 3 years    Types: Cigarettes    Quit date: 03/08/2002  . Smokeless tobacco: Never Used  . Alcohol Use: No    Review of Systems: Review of Systems  Constitutional: Positive for malaise/fatigue. Negative for fever, chills and diaphoresis.  HENT: Positive for congestion and sore throat. Negative for ear pain.   Eyes: Negative.   Respiratory: Positive for cough. Negative for shortness of breath  and wheezing.   Cardiovascular: Negative for chest pain, palpitations and leg swelling.  Gastrointestinal: Negative for heartburn, abdominal pain, diarrhea, constipation, blood in stool and melena.  Genitourinary: Negative.   Skin: Negative.   Neurological: Positive for headaches. Negative for dizziness, sensory change and loss of consciousness.  Psychiatric/Behavioral: Negative for depression. The patient is not nervous/anxious and does not have insomnia.     Physical Exam: Estimated body mass index is 28.35 kg/(m^2) as calculated from the following:   Height as of this encounter: 5\' 3"  (1.6 m).   Weight as of this encounter: 160 lb (72.576 kg). BP 146/82 mmHg  Pulse 74  Temp(Src) 98 F (36.7 C) (Temporal)  Resp 16  Ht 5\' 3"  (1.6 m)  Wt 160 lb (72.576 kg)  BMI 28.35 kg/m2  General Appearance: Well nourished well developed, in no apparent distress.  Eyes: PERRLA, EOMs, conjunctiva no swelling or erythema ENT/Mouth: Ear canals normal without obstruction, swelling, erythema, or discharge.  TMs normal bilaterally with no erythema, bulging, retraction, or loss of landmark.  Oropharynx moist and clear with no exudate, erythema, or swelling.   Neck: Supple, thyroid normal. No bruits.  No cervical adenopathy Respiratory: Respiratory effort  normal, Breath sounds clear A&P without wheeze, rhonchi, rales.   Cardio: RRR without murmurs, rubs or gallops. Brisk peripheral pulses without edema.  Chest: symmetric, with normal excursions Breasts: Symmetric, without lumps, nipple discharge, retractions.  Abdomen: Soft, nontender, no guarding, rebound, hernias, masses, or organomegaly.  Lymphatics: Non tender without lymphadenopathy.  Genitourinary:  Musculoskeletal: Full ROM all peripheral extremities,5/5 strength, and normal gait.  Skin: Warm, dry without rashes, lesions, ecchymosis. Neuro: Awake and oriented X 3, Cranial nerves intact, reflexes equal bilaterally. Normal muscle tone, no cerebellar symptoms. Sensation intact.  Psych:  normal affect, Insight and Judgment appropriate.   EKG: WNL no changes.  AORTA SCAN: WNL   Over 40 minutes of exam, counseling, chart review and critical decision making was performed  Toni Amend Forcucci 2:30 PM [facility]

## 2016-05-23 LAB — URINALYSIS, MICROSCOPIC ONLY
CASTS: NONE SEEN [LPF]
CRYSTALS: NONE SEEN [HPF]
Squamous Epithelial / LPF: NONE SEEN [HPF] (ref <=5)
WBC, UA: >60 WBC/HPF — AB (ref <=5)
Yeast: NONE SEEN [HPF]

## 2016-05-23 LAB — LIPID PANEL
Cholesterol: 184 mg/dL (ref 125–200)
HDL: 60 mg/dL (ref >=46)
LDL CALC: 92 mg/dL (ref <130)
TRIGLYCERIDES: 160 mg/dL — AB (ref <150)
Total CHOL/HDL Ratio: 3.1 Ratio (ref <=5.0)
VLDL: 32 mg/dL — AB (ref <30)

## 2016-05-23 LAB — URINALYSIS, ROUTINE W REFLEX MICROSCOPIC
Bilirubin Urine: NEGATIVE
GLUCOSE, UA: NEGATIVE
NITRITE: POSITIVE — AB
PROTEIN: NEGATIVE
Specific Gravity, Urine: 1.026 (ref 1.001–1.035)
pH: 5 (ref 5.0–8.0)

## 2016-05-23 LAB — INSULIN, RANDOM: Insulin: 5.6 u[IU]/mL (ref 2.0–19.6)

## 2016-05-23 LAB — BASIC METABOLIC PANEL WITH GFR
BUN: 17 mg/dL (ref 7–25)
CALCIUM: 10.1 mg/dL (ref 8.6–10.4)
CHLORIDE: 105 mmol/L (ref 98–110)
CO2: 31 mmol/L (ref 20–31)
CREATININE: 0.89 mg/dL (ref 0.50–1.05)
GFR, Est African American: 83 mL/min (ref >=60)
GFR, Est Non African American: 72 mL/min (ref >=60)
GLUCOSE: 58 mg/dL — AB (ref 65–99)
Potassium: 3.9 mmol/L (ref 3.5–5.3)
Sodium: 145 mmol/L (ref 135–146)

## 2016-05-23 LAB — VITAMIN D 25 HYDROXY (VIT D DEFICIENCY, FRACTURES): VIT D 25 HYDROXY: 41 ng/mL (ref 30–100)

## 2016-05-23 LAB — HEPATIC FUNCTION PANEL
ALT: 25 U/L (ref 6–29)
AST: 22 U/L (ref 10–35)
Albumin: 4.3 g/dL (ref 3.6–5.1)
Alkaline Phosphatase: 91 U/L (ref 33–130)
Bilirubin, Direct: 0.1 mg/dL (ref <=0.2)
Indirect Bilirubin: 0.2 mg/dL (ref 0.2–1.2)
TOTAL PROTEIN: 6.8 g/dL (ref 6.1–8.1)
Total Bilirubin: 0.3 mg/dL (ref 0.2–1.2)

## 2016-05-23 LAB — MICROALBUMIN / CREATININE URINE RATIO
CREATININE, URINE: 226 mg/dL (ref 20–320)
MICROALB/CREAT RATIO: 8 ug/mg{creat} (ref <30)
Microalb, Ur: 1.9 mg/dL — ABNORMAL HIGH

## 2016-05-23 LAB — IRON AND TIBC
%SAT: 19 % (ref 11–50)
IRON: 56 ug/dL (ref 45–160)
TIBC: 296 ug/dL (ref 250–450)
UIBC: 240 ug/dL (ref 125–400)

## 2016-05-23 LAB — MAGNESIUM: Magnesium: 2.1 mg/dL (ref 1.5–2.5)

## 2016-05-24 ENCOUNTER — Other Ambulatory Visit: Payer: Self-pay | Admitting: Internal Medicine

## 2016-05-24 MED ORDER — LEVOFLOXACIN 750 MG PO TABS: 750.0000 mg | ORAL_TABLET | Freq: Every day | ORAL | Status: DC

## 2016-05-29 ENCOUNTER — Other Ambulatory Visit: Payer: Self-pay | Admitting: Internal Medicine

## 2016-06-04 ENCOUNTER — Other Ambulatory Visit: Payer: Self-pay | Admitting: Internal Medicine

## 2016-06-06 ENCOUNTER — Other Ambulatory Visit: Payer: Self-pay | Admitting: Internal Medicine

## 2016-06-09 ENCOUNTER — Other Ambulatory Visit: Payer: Self-pay | Admitting: Gastroenterology

## 2016-06-10 ENCOUNTER — Other Ambulatory Visit: Payer: Self-pay | Admitting: Internal Medicine

## 2016-06-10 DIAGNOSIS — K219 Gastro-esophageal reflux disease without esophagitis: Secondary | ICD-10-CM

## 2016-06-10 MED ORDER — OMEPRAZOLE 20 MG PO CPDR: DELAYED_RELEASE_CAPSULE | ORAL | Status: DC

## 2016-06-12 ENCOUNTER — Telehealth: Payer: Self-pay

## 2016-06-12 NOTE — Telephone Encounter (Signed)
[facility]  1 daily. This is a former Dr Arlyce Dicekaplan pt. She has not established care with a new GI doctor. Last seen in clinic 07-2014. Please advise on refills.

## 2016-06-12 NOTE — Telephone Encounter (Signed)
Rx has already been filled for 1 year by Dr Oneta RackMcKeown

## 2016-06-12 NOTE — Telephone Encounter (Signed)
That is OTC dosing.  She can buy the OTC if she wants or OK to prescribe 20mg  pill, one pill once daily with 30 pills, 11 refills is fine as well.

## 2016-07-20 ENCOUNTER — Ambulatory Visit: Payer: Managed Care, Other (non HMO) | Admitting: Podiatry

## 2016-08-25 ENCOUNTER — Ambulatory Visit: Payer: Self-pay | Admitting: Internal Medicine

## 2016-08-29 ENCOUNTER — Encounter: Payer: Self-pay | Admitting: Internal Medicine

## 2016-08-29 ENCOUNTER — Ambulatory Visit (INDEPENDENT_AMBULATORY_CARE_PROVIDER_SITE_OTHER): Payer: Managed Care, Other (non HMO) | Admitting: Internal Medicine

## 2016-08-29 VITALS — BP 126/64 | HR 72 | Temp 98.4°F | Resp 16 | Ht 63.0 in | Wt 156.0 lb

## 2016-08-29 DIAGNOSIS — G44209 Tension-type headache, unspecified, not intractable: Secondary | ICD-10-CM | POA: Diagnosis not present

## 2016-08-29 DIAGNOSIS — Z79899 Other long term (current) drug therapy: Secondary | ICD-10-CM | POA: Diagnosis not present

## 2016-08-29 DIAGNOSIS — E559 Vitamin D deficiency, unspecified: Secondary | ICD-10-CM

## 2016-08-29 DIAGNOSIS — J309 Allergic rhinitis, unspecified: Secondary | ICD-10-CM | POA: Diagnosis not present

## 2016-08-29 DIAGNOSIS — E785 Hyperlipidemia, unspecified: Secondary | ICD-10-CM

## 2016-08-29 DIAGNOSIS — I10 Essential (primary) hypertension: Secondary | ICD-10-CM

## 2016-08-29 LAB — BASIC METABOLIC PANEL WITH GFR
BUN: 13 mg/dL (ref 7–25)
CALCIUM: 9.2 mg/dL (ref 8.6–10.4)
CO2: 31 mmol/L (ref 20–31)
Chloride: 105 mmol/L (ref 98–110)
Creat: 0.95 mg/dL (ref 0.50–1.05)
GFR, EST AFRICAN AMERICAN: 76 mL/min (ref >=60)
GFR, EST NON AFRICAN AMERICAN: 66 mL/min (ref >=60)
Glucose, Bld: 85 mg/dL (ref 65–99)
Potassium: 3.4 mmol/L — ABNORMAL LOW (ref 3.5–5.3)
SODIUM: 143 mmol/L (ref 135–146)

## 2016-08-29 LAB — CBC WITH DIFFERENTIAL/PLATELET
BASOS ABS: 43 {cells}/uL (ref 0–200)
Basophils Relative: 1 %
EOS PCT: 8 %
Eosinophils Absolute: 344 cells/uL (ref 15–500)
HCT: 41.2 % (ref 35.0–45.0)
HEMOGLOBIN: 13.9 g/dL (ref 11.7–15.5)
LYMPHS ABS: 2322 {cells}/uL (ref 850–3900)
LYMPHS PCT: 54 %
MCH: 32 pg (ref 27.0–33.0)
MCHC: 33.7 g/dL (ref 32.0–36.0)
MCV: 94.9 fL (ref 80.0–100.0)
MONOS PCT: 9 %
MPV: 9.5 fL (ref 7.5–12.5)
Monocytes Absolute: 387 cells/uL (ref 200–950)
NEUTROS PCT: 28 %
Neutro Abs: 1204 cells/uL — ABNORMAL LOW (ref 1500–7800)
Platelets: 190 10*3/uL (ref 140–400)
RBC: 4.34 MIL/uL (ref 3.80–5.10)
RDW: 12.9 % (ref 11.0–15.0)
WBC: 4.3 10*3/uL (ref 3.8–10.8)

## 2016-08-29 LAB — LIPID PANEL
CHOL/HDL RATIO: 3.6 ratio (ref <=5.0)
CHOLESTEROL: 192 mg/dL (ref 125–200)
HDL: 53 mg/dL (ref >=46)
LDL Cholesterol: 115 mg/dL (ref <130)
TRIGLYCERIDES: 119 mg/dL (ref <150)
VLDL: 24 mg/dL (ref <30)

## 2016-08-29 LAB — HEPATIC FUNCTION PANEL
ALT: 31 U/L — ABNORMAL HIGH (ref 6–29)
AST: 23 U/L (ref 10–35)
Albumin: 4 g/dL (ref 3.6–5.1)
Alkaline Phosphatase: 73 U/L (ref 33–130)
BILIRUBIN DIRECT: 0.1 mg/dL (ref <=0.2)
Indirect Bilirubin: 0.2 mg/dL (ref 0.2–1.2)
Total Bilirubin: 0.3 mg/dL (ref 0.2–1.2)
Total Protein: 6.3 g/dL (ref 6.1–8.1)

## 2016-08-29 LAB — TSH: TSH: 1.24 m[IU]/L

## 2016-08-29 MED ORDER — HYDROCHLOROTHIAZIDE 25 MG PO TABS: ORAL_TABLET | ORAL | 0 refills | Status: DC

## 2016-08-29 MED ORDER — PROMETHAZINE-DM 6.25-15 MG/5ML PO SYRP: ORAL_SOLUTION | ORAL | 1 refills | Status: DC

## 2016-08-29 MED ORDER — AMLODIPINE BESYLATE 5 MG PO TABS: ORAL_TABLET | ORAL | 1 refills | Status: DC

## 2016-08-29 MED ORDER — CLONIDINE HCL 0.1 MG PO TABS
0.1000 mg | ORAL_TABLET | Freq: Every day | ORAL | 1 refills | Status: DC
Start: 2016-08-29 — End: 2017-01-30

## 2016-08-29 MED ORDER — AZELASTINE HCL 0.1 % NA SOLN: NASAL | 1 refills | Status: DC

## 2016-08-29 MED ORDER — ESCITALOPRAM OXALATE 20 MG PO TABS: 20.0000 mg | ORAL_TABLET | Freq: Every day | ORAL | 2 refills | Status: DC

## 2016-08-29 MED ORDER — CYCLOBENZAPRINE HCL 10 MG PO TABS: 10.0000 mg | ORAL_TABLET | Freq: Three times a day (TID) | ORAL | 0 refills | Status: DC

## 2016-08-29 MED ORDER — DICLOFENAC SODIUM 75 MG PO TBEC: 75.0000 mg | DELAYED_RELEASE_TABLET | Freq: Two times a day (BID) | ORAL | 2 refills | Status: DC

## 2016-08-29 MED ORDER — PREDNISONE 20 MG PO TABS: ORAL_TABLET | ORAL | 0 refills | Status: DC

## 2016-08-29 MED ORDER — PRAVASTATIN SODIUM 40 MG PO TABS: ORAL_TABLET | ORAL | 3 refills | Status: DC

## 2016-08-29 MED ORDER — BUDESONIDE-FORMOTEROL FUMARATE 80-4.5 MCG/ACT IN AERO: 2.0000 | INHALATION_SPRAY | Freq: Two times a day (BID) | RESPIRATORY_TRACT | 0 refills | Status: DC

## 2016-08-29 NOTE — Progress Notes (Signed)
Assessment and Plan:  Hypertension:  -Continue medication,  -monitor blood pressure at home.  -Continue DASH diet.   -Reminder to go to the ER if any CP, SOB, nausea, dizziness, severe HA, changes vision/speech, left arm numbness and tingling, and jaw pain.  Cholesterol: -Continue diet and exercise.  -Check cholesterol.   Pre-diabetes: -Continue diet and exercise.  -Check A1C  Vitamin D Def: -check level -continue medications.   Muscle tension -flexeril given  Need for pap smear -if patient unable to get to obgyn we will get pap smear done here at this office at next visit.  Allergy exacerbation -start using flonase -prednisone -phenergan dm  Continue diet and meds as discussed. Further disposition pending results of labs.  HPI 60 y.o. female  presents for 3 month follow up with hypertension, hyperlipidemia, prediabetes and vitamin D.   Her blood pressure has been controlled at home, today their BP is BP: 126/64.   She does not workout. She denies chest pain, shortness of breath, dizziness.     She is on cholesterol medication and denies myalgias. Her cholesterol is at goal. The cholesterol last visit was:   Lab Results  Component Value Date   CHOL 184 05/22/2016   HDL 60 05/22/2016   LDLCALC 92 05/22/2016   TRIG 160 (H) 05/22/2016   CHOLHDL 3.1 05/22/2016     She has been working on diet and exercise for prediabetes, and denies foot ulcerations, hyperglycemia, hypoglycemia , increased appetite, nausea, paresthesia of the feet, polydipsia, polyuria, visual disturbances, vomiting and weight loss. Last A1C in the office was:  Lab Results  Component Value Date   HGBA1C 5.5 05/22/2016    Patient is on Vitamin D supplement.  Lab Results  Component Value Date   VD25OH 41 05/22/2016     She reports that she has been having some issues with muscle tension.  She reports that she usually takes a muscle relaxer which is filled with her obgyn.  She wants to know if we  can fill it.  With work and increased stress due to her dog being ill she is having more shoulder tension.    She reports that she is due to have a pap smear  She is trying to get this set up currently.  Current Medications:  Current Outpatient Prescriptions on File Prior to Visit  Medication Sig Dispense Refill  . albuterol (PROVENTIL HFA;VENTOLIN HFA) 108 (90 BASE) MCG/ACT inhaler Inhale 1-2 puffs into the lungs every 6 (six) hours as needed for wheezing or shortness of breath (cough). 1 Inhaler 0  . amLODipine (NORVASC) 5 MG tablet TAKE 1 TABLET (5 MG TOTAL) BY MOUTH DAILY. 30 tablet 1  . aspirin 81 MG tablet Take 81 mg by mouth daily.    Marland Kitchen azelastine (ASTELIN) 0.1 % nasal spray PLACE 2 SPRAYS INTO BOTH NOSTRILS 2 (TWO) TIMES DAILY. USE IN EACH NOSTRIL AS DIRECTED 30 mL 1  . Biotin 300 MCG TABS Take 1 tablet by mouth at bedtime.     . budesonide-formoterol (SYMBICORT) 80-4.5 MCG/ACT inhaler Inhale 2 puffs into the lungs 2 (two) times daily. 2 Inhaler 0  . Cholecalciferol (VITAMIN D3) 2000 UNITS TABS Take 2 tablets by mouth at bedtime.     . cloNIDine (CATAPRES) 0.1 MG tablet Take 1 tablet (0.1 mg total) by mouth daily. 90 tablet 1  . diclofenac (VOLTAREN) 75 MG EC tablet Take 1 tablet (75 mg total) by mouth 2 (two) times daily. 50 tablet 2  . escitalopram (LEXAPRO) 20  MG tablet Take 1 tablet (20 mg total) by mouth daily. 90 tablet 2  . EVENING PRIMROSE OIL PO Take by mouth daily.    . hydrochlorothiazide (HYDRODIURIL) 25 MG tablet TAKE 1 TABLET (25 MG TOTAL) BY MOUTH DAILY. 90 tablet 0  . latanoprost (XALATAN) 0.005 % ophthalmic solution Place 1 drop into both eyes at bedtime.    Marland Kitchen. loratadine (CLARITIN) 10 MG tablet Take 10 mg by mouth daily as needed for allergies.     . Magnesium 250 MG TABS Take 1 tablet by mouth 2 (two) times daily.     . Omega-3 Fatty Acids (OMEGA 3 PO) Take 1 capsule by mouth 2 (two) times daily.     Marland Kitchen. omeprazole (PRILOSEC) 20 MG capsule Take 1 capsule daily for Acid  Indigestion & Heartburn 90 capsule 1  . pravastatin (PRAVACHOL) 40 MG tablet TAKE 1 TABLET (40 MG TOTAL) BY MOUTH DAILY. 90 tablet 3   No current facility-administered medications on file prior to visit.     Medical History:  Past Medical History:  Diagnosis Date  . Allergy   . Anxiety   . Depression   . Esophageal stricture   . GERD (gastroesophageal reflux disease)   . Hyperlipemia   . Hypertension   . Unspecified vitamin D deficiency     Allergies:  Allergies  Allergen Reactions  . 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 Clinoril [Sulindac] Rash     Review of Systems:  Review of Systems  Constitutional: Negative for chills, fever and malaise/fatigue.  HENT: Negative for congestion, ear pain and sore throat.   Eyes: Negative.   Respiratory: Negative for cough, shortness of breath and wheezing.   Cardiovascular: Negative for chest pain, palpitations and leg swelling.  Gastrointestinal: Negative for abdominal pain, blood in stool, constipation, diarrhea, heartburn and melena.  Genitourinary: Negative.   Skin: Negative.   Neurological: Negative for dizziness, sensory change, loss of consciousness and headaches.  Psychiatric/Behavioral: Negative for depression. The patient is not nervous/anxious and does not have insomnia.     Family history- Review and unchanged  Social history- Review and unchanged  Physical Exam: BP 126/64   Pulse 72   Temp 98.4 F (36.9 C) (Temporal)   Resp 16   Ht 5\' 3"  (1.6 m)   Wt 156 lb (70.8 kg)   BMI 27.63 kg/m  Wt Readings from Last 3 Encounters:  08/29/16 156 lb (70.8 kg)  05/22/16 160 lb (72.6 kg)  12/30/15 156 lb (70.8 kg)    General Appearance: Well nourished well developed, in no apparent distress. Eyes: PERRLA, EOMs, conjunctiva no swelling or erythema ENT/Mouth: Ear canals normal without obstruction, swelling, erythma, discharge.  TMs normal bilaterally.  Oropharynx moist, clear,  without exudate, or postoropharyngeal swelling. Neck: Supple, thyroid normal,no cervical adenopathy  Respiratory: Respiratory effort normal, Breath sounds clear A&P without rhonchi, wheeze, or rale.  No retractions, no accessory usage. Cardio: RRR with no MRGs. Brisk peripheral pulses without edema.  Abdomen: Soft, + BS,  Non tender, no guarding, rebound, hernias, masses. Musculoskeletal: Full ROM, 5/5 strength, Normal gait Skin: Warm, dry without rashes, lesions, ecchymosis.  Neuro: Awake and oriented X 3, Cranial nerves intact. Normal muscle tone, no cerebellar symptoms. Psych: Normal affect, Insight and Judgment appropriate.    Terri Piedraourtney Forcucci, PA-C 10:15 AM [facility]

## 2016-10-05 ENCOUNTER — Encounter: Payer: Self-pay | Admitting: Internal Medicine

## 2016-10-05 ENCOUNTER — Ambulatory Visit (INDEPENDENT_AMBULATORY_CARE_PROVIDER_SITE_OTHER): Payer: Managed Care, Other (non HMO) | Admitting: Internal Medicine

## 2016-10-05 VITALS — BP 158/76 | HR 100 | Temp 98.2°F | Resp 16 | Ht 63.0 in | Wt 160.0 lb

## 2016-10-05 DIAGNOSIS — J069 Acute upper respiratory infection, unspecified: Secondary | ICD-10-CM

## 2016-10-05 MED ORDER — AZITHROMYCIN 250 MG PO TABS: ORAL_TABLET | ORAL | 1 refills | Status: DC

## 2016-10-05 MED ORDER — FLUTICASONE PROPIONATE 50 MCG/ACT NA SUSP: 2.0000 | Freq: Every day | NASAL | 0 refills | Status: DC

## 2016-10-05 MED ORDER — PROMETHAZINE-DM 6.25-15 MG/5ML PO SYRP: ORAL_SOLUTION | ORAL | 1 refills | Status: DC

## 2016-10-05 MED ORDER — RANITIDINE HCL 300 MG PO TABS: 300.0000 mg | ORAL_TABLET | Freq: Every day | ORAL | 1 refills | Status: DC

## 2016-10-05 MED ORDER — PREDNISONE 20 MG PO TABS: ORAL_TABLET | ORAL | 0 refills | Status: DC

## 2016-10-05 NOTE — Progress Notes (Signed)
HPI  Patient presents to the office for evaluation of cough.  It has been going on for 2 days.  Patient reports wet, She is bringing up red colored mucous which is similar to what she is blowing out of her nose.  They also endorse change in voice, chills, postnasal drip and nasal congestion, red nasal sputum, achiness, fatigue, headaches..  They have tried mucinex.  They report that nothing has worked.  They admits to other sick contacts.  Several of her coworkers are sick.  Review of Systems  Constitutional: Positive for malaise/fatigue. Negative for chills and fever.  HENT: Positive for congestion, ear pain, hearing loss and sore throat.   Respiratory: Positive for cough. Negative for sputum production, shortness of breath and wheezing.   Cardiovascular: Negative for chest pain, palpitations and leg swelling.  Neurological: Positive for headaches.    PE:  Vitals:   10/05/16 1459  BP: (!) 158/76  Pulse: 100  Resp: 16  Temp: 98.2 F (36.8 C)    General:  Alert and non-toxic, WDWN, NAD HEENT: NCAT, PERLA, EOM normal, no occular discharge or erythema.  Nasal mucosal edema with sinus tenderness to palpation.  Oropharynx clear with minimal oropharyngeal edema and erythema.  Mucous membranes moist and pink. Neck:  Cervical adenopathy Chest:  RRR no MRGs.  Lungs clear to auscultation A&P with no wheezes rhonchi or rales.   Abdomen: +BS x 4 quadrants, soft, non-tender, no guarding, rigidity, or rebound. Skin: warm and dry no rash Neuro: A&Ox4, CN II-XII grossly intact  Assessment and Plan:   1. Acute URI -daily antihistamine -cont asetelin - promethazine-dextromethorphan (PROMETHAZINE-DM) 6.25-15 MG/5ML syrup; Take 5-10 ml PO q8hrs prn for cough  Dispense: 180 mL; Refill: 1 - predniSONE (DELTASONE) 20 MG tablet; 3 tabs po daily x 3 days, then 2 tabs x 3 days, then 1.5 tabs x 3 days, then 1 tab x 3 days, then 0.5 tabs x 3 days  Dispense: 27 tablet; Refill: 0 - fluticasone (FLONASE) 50  MCG/ACT nasal spray; Place 2 sprays into both nostrils daily.  Dispense: 16 g; Refill: 0 - ranitidine (ZANTAC) 300 MG tablet; Take 1 tablet (300 mg total) by mouth at bedtime.  Dispense: 30 tablet; Refill: 1 - azithromycin (ZITHROMAX) 250 MG tablet; Take 2 tablets (500 mg) on  Day 1,  followed by 1 tablet (250 mg) once daily on Days 2 through 5.  Dispense: 6 each; Refill: 1

## 2016-10-05 NOTE — Patient Instructions (Signed)
Please start taking claritin, zyrtec, or allegra once daily to help dry up congestion.  Please use 2 sprays of flonase in each nostril at bedtime.  Please use 2 sprays per nostril of astelin morning and nighttime.  Please take the prednisone as prescribed until it is gone.  Please take ranitidine once in the  Morning and once in the evening for heart burn and congestion.  Please take phenergan dm up to 3 times per day.  Please hold the zpak for 4-5 days.  IF you have worsening symptoms, fever, or changes in the color of your mucous go ahead and take it.  Use saline in your nose to rinse your nose out.

## 2016-11-05 ENCOUNTER — Other Ambulatory Visit: Payer: Self-pay | Admitting: Internal Medicine

## 2016-11-05 DIAGNOSIS — J069 Acute upper respiratory infection, unspecified: Secondary | ICD-10-CM

## 2016-12-06 ENCOUNTER — Other Ambulatory Visit: Payer: Self-pay | Admitting: Internal Medicine

## 2016-12-06 DIAGNOSIS — J069 Acute upper respiratory infection, unspecified: Secondary | ICD-10-CM

## 2016-12-07 ENCOUNTER — Ambulatory Visit (INDEPENDENT_AMBULATORY_CARE_PROVIDER_SITE_OTHER): Payer: Managed Care, Other (non HMO) | Admitting: Physician Assistant

## 2016-12-07 ENCOUNTER — Encounter: Payer: Self-pay | Admitting: Physician Assistant

## 2016-12-07 VITALS — BP 148/80 | HR 84 | Temp 97.7°F | Resp 16 | Ht 63.0 in | Wt 165.2 lb

## 2016-12-07 DIAGNOSIS — J069 Acute upper respiratory infection, unspecified: Secondary | ICD-10-CM | POA: Diagnosis not present

## 2016-12-07 MED ORDER — PROMETHAZINE-CODEINE 6.25-10 MG/5ML PO SYRP: 5.0000 mL | ORAL_SOLUTION | Freq: Four times a day (QID) | ORAL | 0 refills | Status: DC | PRN

## 2016-12-07 MED ORDER — PREDNISONE 20 MG PO TABS: ORAL_TABLET | ORAL | 0 refills | Status: DC

## 2016-12-07 MED ORDER — ALBUTEROL SULFATE HFA 108 (90 BASE) MCG/ACT IN AERS: 2.0000 | INHALATION_SPRAY | RESPIRATORY_TRACT | 0 refills | Status: DC | PRN

## 2016-12-07 MED ORDER — PROMETHAZINE-DM 6.25-15 MG/5ML PO SYRP: ORAL_SOLUTION | ORAL | 1 refills | Status: DC

## 2016-12-07 MED ORDER — AZITHROMYCIN 250 MG PO TABS: ORAL_TABLET | ORAL | 1 refills | Status: AC

## 2016-12-07 MED ORDER — ALBUTEROL SULFATE HFA 108 (90 BASE) MCG/ACT IN AERS
2.0000 | INHALATION_SPRAY | RESPIRATORY_TRACT | 0 refills | Status: DC | PRN
Start: 2016-12-07 — End: 2021-09-22

## 2016-12-07 NOTE — Patient Instructions (Signed)
Make sure you are on an allergy pill, see below for more details. Please take the prednisone as directed below, this is NOT an antibiotic so you do NOT have to finish it. You can take it for a few days and stop it if you are doing better.   Please take the prednisone to help decrease inflammation and therefore decrease symptoms. Take it it with food to avoid GI upset. It can cause increased energy but on the other hand it can make it hard to sleep at night so please take it AT NIGHT WITH DINNER, it takes 8-12 hours to start working so it will NOT affect your sleeping if you take it at night with your food!!  If you are diabetic it will increase your sugars so decrease carbs and monitor your sugars closely.     Do flexeril and heating pad for your back  HOW TO TREAT VIRAL COUGH AND COLD SYMPTOMS:  -Symptoms usually last at least 1 week with the worst symptoms being around day 4.  - colds usually start with a sore throat and end with a cough, and the cough can take 2 weeks to get better.  -No antibiotics are needed for colds, flu, sore throats, cough, bronchitis UNLESS symptoms are longer than 7 days OR if you are getting better then get drastically worse.  -There are a lot of combination medications (Dayquil, Nyquil, Vicks 44, tyelnol cold and sinus, ETC). Please look at the ingredients on the back so that you are treating the correct symptoms and not doubling up on medications/ingredients.    Medicines you can use  Nasal congestion  - pseudoephedrine (Sudafed)- behind the counter, do not use if you have high blood pressure, medicine that have -D in them.  - phenylephrine (Sudafed PE) -Dextormethorphan + chlorpheniramine (Coridcidin HBP)- okay if you have high blood pressure -Oxymetazoline (Afrin) nasal spray- LIMIT to 3 days -Saline nasal spray -Neti pot (used distilled or bottled water)  Ear pain/congestion  -pseudoephedrine (sudafed) - Nasonex/flonase nasal  spray  Fever  -Acetaminophen (Tyelnol) -Ibuprofen (Advil, motrin, aleve)  Sore Throat  -Acetaminophen (Tyelnol) -Ibuprofen (Advil, motrin, aleve) -Drink a lot of water -Gargle with salt water - Rest your voice (don't talk) -Throat sprays -Cough drops  Body Aches  -Acetaminophen (Tyelnol) -Ibuprofen (Advil, motrin, aleve)  Headache  -Acetaminophen (Tyelnol) -Ibuprofen (Advil, motrin, aleve) - Exedrin, Exedrin Migraine  Allergy symptoms (cough, sneeze, runny nose, itchy eyes) -Claritin or loratadine cheapest but likely the weakest  -Zyrtec or certizine at night because it can make you sleepy -The strongest is allegra or fexafinadine  Cheapest at [employer], [employer], [employer]  Cough  -Dextromethorphan (Delsym)- medicine that has DM in it -Guafenesin (Mucinex/Robitussin) - cough drops - drink lots of water  Chest Congestion  -Guafenesin (Mucinex/Robitussin)  Red Itchy Eyes  - Naphcon-A  Upset Stomach  - Bland diet (nothing spicy, greasy, fried, and high acid foods like tomatoes, oranges, berries) -OKAY- cereal, bread, soup, crackers, rice -Eat smaller more frequent meals -reduce caffeine, no alcohol -Loperamide (Imodium-AD) if diarrhea -Prevacid for heart burn  General health when sick  -Hydration -wash your hands frequently -keep surfaces clean -change pillow cases and sheets often -Get fresh air but do not exercise strenuously -Vitamin D, double up on it - Vitamin C -Zinc

## 2016-12-07 NOTE — Progress Notes (Signed)
Subjective:    Patient ID: Kaitlyn [name], female    DOB: 04/29/1956, 60 y.o.   MRN: [id number]  HPI 60 y.o. AAF with history of HTN, GERD, chol presents with cough x 1 week. She has been using flonase, prescription cough, symbicort and allergy pill. Has had fever, chills, productive green cough, she will have sharp right shoulder blade pain with coughing only.   Blood pressure (!) 148/80, pulse 84, temperature 97.7 F (36.5 C), resp. rate 16, height 5\' 3"  (1.6 m), weight 165 lb 3.2 oz (74.9 kg), SpO2 98 %.  Medications Current Outpatient Prescriptions on File Prior to Visit  Medication Sig  . amLODipine (NORVASC) 5 MG tablet TAKE 1 TABLET (5 MG TOTAL) BY MOUTH DAILY.  Marland Kitchen. aspirin 81 MG tablet Take 81 mg by mouth daily.  Marland Kitchen. azelastine (ASTELIN) 0.1 % nasal spray PLACE 2 SPRAYS INTO BOTH NOSTRILS 2 (TWO) TIMES DAILY. USE IN EACH NOSTRIL AS DIRECTED  . Biotin 300 MCG TABS Take 1 tablet by mouth at bedtime.   . budesonide-formoterol (SYMBICORT) 80-4.5 MCG/ACT inhaler Inhale 2 puffs into the lungs 2 (two) times daily.  . Cholecalciferol (VITAMIN D3) 2000 UNITS TABS Take 2 tablets by mouth at bedtime.   . cloNIDine (CATAPRES) 0.1 MG tablet Take 1 tablet (0.1 mg total) by mouth daily.  . cyclobenzaprine (FLEXERIL) 10 MG tablet Take 1 tablet (10 mg total) by mouth 3 (three) times daily.  . diclofenac (VOLTAREN) 75 MG EC tablet Take 1 tablet (75 mg total) by mouth 2 (two) times daily.  Marland Kitchen. escitalopram (LEXAPRO) 20 MG tablet Take 1 tablet (20 mg total) by mouth daily.  Marland Kitchen. EVENING PRIMROSE OIL PO Take by mouth daily.  . fluticasone (FLONASE) 50 MCG/ACT nasal spray SPRAY TWO SPRAYS IN EACH NOSTRIL ONCE DAILY  . hydrochlorothiazide (HYDRODIURIL) 25 MG tablet TAKE 1 TABLET (25 MG TOTAL) BY MOUTH DAILY.  Marland Kitchen. latanoprost (XALATAN) 0.005 % ophthalmic solution Place 1 drop into both eyes at bedtime.  Marland Kitchen. loratadine (CLARITIN) 10 MG tablet Take 10 mg by mouth daily as needed for allergies.   . Magnesium 250 MG  TABS Take 1 tablet by mouth 2 (two) times daily.   . Omega-3 Fatty Acids (OMEGA 3 PO) Take 1 capsule by mouth 2 (two) times daily.   Marland Kitchen. omeprazole (PRILOSEC) 20 MG capsule Take 1 capsule daily for Acid Indigestion & Heartburn  . pravastatin (PRAVACHOL) 40 MG tablet TAKE 1 TABLET (40 MG TOTAL) BY MOUTH DAILY.  . ranitidine (ZANTAC) 300 MG tablet TAKE ONE TABLET BY MOUTH AT BEDTIME   No current facility-administered medications on file prior to visit.     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   Review of Systems  Constitutional: Positive for fatigue. Negative for chills and fever.  HENT: Positive for congestion, rhinorrhea, sinus pressure and sore throat. Negative for dental problem, ear discharge, ear pain, nosebleeds, trouble swallowing and voice change.   Respiratory: Positive for cough, chest tightness, shortness of breath and wheezing.   Cardiovascular: Negative.   Gastrointestinal: Negative.   Genitourinary: Negative.   Musculoskeletal: Negative.   Neurological: Negative.        Objective:   Physical Exam  Constitutional: She is oriented to person, place, and time. She appears well-developed and well-nourished.  HENT:  Right Ear: Hearing and external ear normal. No mastoid tenderness.  Tympanic membrane is injected. Tympanic membrane is not perforated, not erythematous, not retracted and not bulging. A middle ear effusion is present.  Left Ear: Hearing and external ear normal. No mastoid tenderness. Tympanic membrane is injected. Tympanic membrane is not perforated, not erythematous, not retracted and not bulging. A middle ear effusion is present.  Nose: Right sinus exhibits maxillary sinus tenderness. Left sinus exhibits maxillary sinus tenderness.   Mouth/Throat: Uvula is midline, oropharynx is clear and moist and mucous membranes are normal.  Eyes: Conjunctivae and EOM are normal. Pupils are equal, round, and reactive to light.  Neck: Neck supple.  Cardiovascular: Normal rate and regular rhythm.   Pulmonary/Chest: Effort normal. No respiratory distress. She has wheezes (diffuse).  Abdominal: Soft. Bowel sounds are normal.  Musculoskeletal: Normal range of motion. She exhibits tenderness (tenderness right shoulder).  Lymphadenopathy:    She has no cervical adenopathy.  Neurological: She is alert and oriented to person, place, and time.  Skin: Skin is warm and dry.      Assessment & Plan:  1. Acute URI with likely muscular pain Right shoulder tender to palpation, no SOB, palpitations, leg swelling, low risk DVT, nonexertional but worse with cough likely pulled muscle from coughing but if any other symptoms/worsening will go to ER, patient understands - predniSONE (DELTASONE) 20 MG tablet; 2 tablets daily for 3 days, 1 tablet daily for 4 days.  Dispense: 10 tablet; Refill: 0 - azithromycin (ZITHROMAX) 250 MG tablet; Take 2 tablets (500 mg) on  Day 1,  followed by 1 tablet (250 mg) once daily on Days 2 through 5.  Dispense: 6 each; Refill: 1 - promethazine-dextromethorphan (PROMETHAZINE-DM) 6.25-15 MG/5ML syrup; Take 5-10 ml PO q8hrs prn for cough  Dispense: 180 mL; Refill: 1  The patient was advised to call immediately if she has any concerning symptoms in the interval. The patient voices understanding of current treatment options and is in agreement with the current care plan.The patient knows to call the clinic with any problems, questions or concerns or go to the ER if any further progression of symptoms.

## 2016-12-20 ENCOUNTER — Encounter: Payer: Self-pay | Admitting: Internal Medicine

## 2016-12-20 ENCOUNTER — Other Ambulatory Visit (HOSPITAL_COMMUNITY)
Admission: RE | Admit: 2016-12-20 | Discharge: 2016-12-20 | Disposition: A | Discharge status: Home / Self Care | Payer: Managed Care, Other (non HMO) | Source: Ambulatory Visit | Attending: Internal Medicine | Admitting: Internal Medicine

## 2016-12-20 ENCOUNTER — Ambulatory Visit (INDEPENDENT_AMBULATORY_CARE_PROVIDER_SITE_OTHER): Payer: Managed Care, Other (non HMO) | Admitting: Internal Medicine

## 2016-12-20 VITALS — BP 118/68 | HR 88 | Temp 98.0°F | Resp 16 | Ht 63.0 in | Wt 165.0 lb

## 2016-12-20 DIAGNOSIS — I10 Essential (primary) hypertension: Secondary | ICD-10-CM | POA: Diagnosis not present

## 2016-12-20 DIAGNOSIS — Z124 Encounter for screening for malignant neoplasm of cervix: Secondary | ICD-10-CM

## 2016-12-20 DIAGNOSIS — Z1151 Encounter for screening for human papillomavirus (HPV): Secondary | ICD-10-CM | POA: Insufficient documentation

## 2016-12-20 DIAGNOSIS — K2100 Gastro-esophageal reflux disease with esophagitis, without bleeding: Secondary | ICD-10-CM

## 2016-12-20 DIAGNOSIS — E559 Vitamin D deficiency, unspecified: Secondary | ICD-10-CM | POA: Diagnosis not present

## 2016-12-20 DIAGNOSIS — E782 Mixed hyperlipidemia: Secondary | ICD-10-CM | POA: Diagnosis not present

## 2016-12-20 DIAGNOSIS — I119 Hypertensive heart disease without heart failure: Secondary | ICD-10-CM

## 2016-12-20 DIAGNOSIS — Z79899 Other long term (current) drug therapy: Secondary | ICD-10-CM

## 2016-12-20 DIAGNOSIS — Z1159 Encounter for screening for other viral diseases: Secondary | ICD-10-CM

## 2016-12-20 DIAGNOSIS — Z01419 Encounter for gynecological examination (general) (routine) without abnormal findings: Secondary | ICD-10-CM | POA: Diagnosis present

## 2016-12-20 DIAGNOSIS — R7303 Prediabetes: Secondary | ICD-10-CM

## 2016-12-20 LAB — HEPATIC FUNCTION PANEL
ALBUMIN: 3.9 g/dL (ref 3.6–5.1)
ALT: 18 U/L (ref 6–29)
AST: 14 U/L (ref 10–35)
Alkaline Phosphatase: 81 U/L (ref 33–130)
BILIRUBIN DIRECT: 0.1 mg/dL (ref <=0.2)
Indirect Bilirubin: 0.4 mg/dL (ref 0.2–1.2)
Total Bilirubin: 0.5 mg/dL (ref 0.2–1.2)
Total Protein: 6.4 g/dL (ref 6.1–8.1)

## 2016-12-20 LAB — BASIC METABOLIC PANEL WITH GFR
BUN: 17 mg/dL (ref 7–25)
CALCIUM: 9.1 mg/dL (ref 8.6–10.4)
CO2: 30 mmol/L (ref 20–31)
CREATININE: 0.87 mg/dL (ref 0.50–1.05)
Chloride: 102 mmol/L (ref 98–110)
GFR, Est African American: 85 mL/min (ref >=60)
GFR, Est Non African American: 74 mL/min (ref >=60)
Glucose, Bld: 112 mg/dL — ABNORMAL HIGH (ref 65–99)
Potassium: 3.3 mmol/L — ABNORMAL LOW (ref 3.5–5.3)
SODIUM: 141 mmol/L (ref 135–146)

## 2016-12-20 LAB — CBC WITH DIFFERENTIAL/PLATELET
Basophils Absolute: 0 cells/uL (ref 0–200)
Basophils Relative: 0 %
EOS ABS: 295 {cells}/uL (ref 15–500)
EOS PCT: 5 %
HCT: 40.6 % (ref 35.0–45.0)
Hemoglobin: 13.6 g/dL (ref 11.7–15.5)
Lymphocytes Relative: 38 %
Lymphs Abs: 2242 cells/uL (ref 850–3900)
MCH: 32.4 pg (ref 27.0–33.0)
MCHC: 33.5 g/dL (ref 32.0–36.0)
MCV: 96.7 fL (ref 80.0–100.0)
MONOS PCT: 9 %
MPV: 9.5 fL (ref 7.5–12.5)
Monocytes Absolute: 531 cells/uL (ref 200–950)
NEUTROS PCT: 48 %
Neutro Abs: 2832 cells/uL (ref 1500–7800)
PLATELETS: 216 10*3/uL (ref 140–400)
RBC: 4.2 MIL/uL (ref 3.80–5.10)
RDW: 14 % (ref 11.0–15.0)
WBC: 5.9 10*3/uL (ref 3.8–10.8)

## 2016-12-20 LAB — HEMOGLOBIN A1C
Hgb A1c MFr Bld: 6.1 % — ABNORMAL HIGH (ref <5.7)
MEAN PLASMA GLUCOSE: 128 mg/dL

## 2016-12-20 LAB — LIPID PANEL
CHOLESTEROL: 201 mg/dL — AB (ref <200)
HDL: 53 mg/dL (ref >50)
LDL Cholesterol: 112 mg/dL — ABNORMAL HIGH (ref <100)
TRIGLYCERIDES: 182 mg/dL — AB (ref <150)
Total CHOL/HDL Ratio: 3.8 Ratio (ref <5.0)
VLDL: 36 mg/dL — ABNORMAL HIGH (ref <30)

## 2016-12-20 LAB — TSH: TSH: 0.98 m[IU]/L

## 2016-12-20 LAB — HEPATITIS C ANTIBODY: HCV AB: NEGATIVE

## 2016-12-20 MED ORDER — NYSTATIN 100000 UNIT/ML MT SUSP: OROMUCOSAL | 0 refills | Status: DC

## 2016-12-20 NOTE — Patient Instructions (Signed)
Please try to use symbicort 160 mg spray.  Use 2 puffs first thing in the morning.  Please rinse your mouth out after you use the symbicort.  You will also use 2 puffs of the symbicort in the evening.  Again you must rinse your mouth out otherwise it will get sore.  Please use the albuterol as you need it for the shortness of breath.  You cannot use it any more often than every 6 hours.  You can continue to take the cough syrup if you need it.  I am going to send in a mouthwash.  Take a sip of it.  Swish it around and hold it in your mouth for 30 seconds.  Then you can spit it out.

## 2016-12-20 NOTE — Progress Notes (Signed)
Assessment and Plan:  Hypertension:  -most well controlled BP I have ever seen with patient -Continue medication -monitor blood pressure at home. -Continue DASH diet -Reminder to go to the ER if any CP, SOB, nausea, dizziness, severe HA, changes vision/speech, left arm numbness and tingling and jaw pain.  Cholesterol - Continue diet and exercise -Check cholesterol.   PreDiabetes -Continue diet and exercise.  -Check A1C  Vitamin D Def -continue medications.   Need for cervical cancer screening -pap done today -due in 3 years if normal  Need for hepatitis C screening in a low risk patient -Hep C antibody  Cough and wheezing -increase symbicort to 2 puffs twice daily  -cont phenergan dm -likely COPD like changes -albuterol prn  Continue diet and meds as discussed. Further disposition pending results of labs. Discussed med's effects and SE's.    HPI 61 y.o. female  presents for 3 month follow up with hypertension, hyperlipidemia, diabetes and vitamin D deficiency.   Her blood pressure has been controlled at home, today their BP is BP: 118/68.She does not workout. She denies chest pain, shortness of breath, dizziness.  She reports that her stress levels have somewhat improved.  She notes that she doesn't really check pressures regularly but one time she did check it was mildly elevated.     She is on cholesterol medication and denies myalgias. Her cholesterol is at goal. The cholesterol was:  08/29/2016: Cholesterol 192; HDL 53; LDL Cholesterol 115; Triglycerides 119   She has been working on diet and exercise for prediabetes without complications, she is on bASA, she is on ACE/ARB, and denies  foot ulcerations, hyperglycemia, hypoglycemia , increased appetite, nausea, paresthesia of the feet, polydipsia, polyuria, visual disturbances, vomiting and weight loss. Last A1C was: 05/22/2016: Hgb A1c MFr Bld 5.5   Patient is on Vitamin D supplement. 05/22/2016: Vit D, 25-Hydroxy  41  She reports that she is still having some issues with wheezing in her chest.  She is using symbicort 2 puffs in the evening.  She reports that she is only using the albuterol at nighttime.    Current Medications:  Current Outpatient Prescriptions on File Prior to Visit  Medication Sig Dispense Refill  . albuterol (VENTOLIN HFA) 108 (90 Base) MCG/ACT inhaler Inhale 2 puffs into the lungs every 4 (four) hours as needed for wheezing or shortness of breath. 1 Inhaler 0  . amLODipine (NORVASC) 5 MG tablet TAKE 1 TABLET (5 MG TOTAL) BY MOUTH DAILY. 30 tablet 1  . aspirin 81 MG tablet Take 81 mg by mouth daily.    Marland Kitchen. azelastine (ASTELIN) 0.1 % nasal spray PLACE 2 SPRAYS INTO BOTH NOSTRILS 2 (TWO) TIMES DAILY. USE IN EACH NOSTRIL AS DIRECTED 30 mL 1  . Biotin 300 MCG TABS Take 1 tablet by mouth at bedtime.     . budesonide-formoterol (SYMBICORT) 80-4.5 MCG/ACT inhaler Inhale 2 puffs into the lungs 2 (two) times daily. 2 Inhaler 0  . Cholecalciferol (VITAMIN D3) 2000 UNITS TABS Take 2 tablets by mouth at bedtime.     . cloNIDine (CATAPRES) 0.1 MG tablet Take 1 tablet (0.1 mg total) by mouth daily. 90 tablet 1  . cyclobenzaprine (FLEXERIL) 10 MG tablet Take 1 tablet (10 mg total) by mouth 3 (three) times daily. 90 tablet 0  . diclofenac (VOLTAREN) 75 MG EC tablet Take 1 tablet (75 mg total) by mouth 2 (two) times daily. 50 tablet 2  . escitalopram (LEXAPRO) 20 MG tablet Take 1 tablet (20 mg total)  by mouth daily. 90 tablet 2  . EVENING PRIMROSE OIL PO Take by mouth daily.    . fluticasone (FLONASE) 50 MCG/ACT nasal spray SPRAY TWO SPRAYS IN EACH NOSTRIL ONCE DAILY 16 g 0  . hydrochlorothiazide (HYDRODIURIL) 25 MG tablet TAKE 1 TABLET (25 MG TOTAL) BY MOUTH DAILY. 90 tablet 0  . latanoprost (XALATAN) 0.005 % ophthalmic solution Place 1 drop into both eyes at bedtime.    Marland Kitchen loratadine (CLARITIN) 10 MG tablet Take 10 mg by mouth daily as needed for allergies.     . Magnesium 250 MG TABS Take 1 tablet by  mouth 2 (two) times daily.     . Omega-3 Fatty Acids (OMEGA 3 PO) Take 1 capsule by mouth 2 (two) times daily.     . pravastatin (PRAVACHOL) 40 MG tablet TAKE 1 TABLET (40 MG TOTAL) BY MOUTH DAILY. 90 tablet 3  . promethazine-codeine (PHENERGAN WITH CODEINE) 6.25-10 MG/5ML syrup Take 5 mLs by mouth every 6 (six) hours as needed for cough. Max: 20mL per day 180 mL 0  . promethazine-dextromethorphan (PROMETHAZINE-DM) 6.25-15 MG/5ML syrup Take 5-10 ml PO q8hrs prn for cough 180 mL 1  . ranitidine (ZANTAC) 300 MG tablet TAKE ONE TABLET BY MOUTH AT BEDTIME 30 tablet 0  . omeprazole (PRILOSEC) 20 MG capsule Take 1 capsule daily for Acid Indigestion & Heartburn 90 capsule 1   No current facility-administered medications on file prior to visit.    Medical History:  Past Medical History:  Diagnosis Date  . Allergy   . Anxiety   . Depression   . Esophageal stricture   . GERD (gastroesophageal reflux disease)   . Hyperlipemia   . Hypertension   . Unspecified vitamin D deficiency    Allergies:  Allergies  Allergen Reactions  . 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 Clinoril [Sulindac] Rash     Review of Systems:  Review of Systems  Constitutional: Negative for chills, fever and malaise/fatigue.  HENT: Negative for congestion, ear pain and sore throat.   Eyes: Negative.   Respiratory: Negative for cough, shortness of breath and wheezing.   Cardiovascular: Negative for chest pain, palpitations and leg swelling.  Gastrointestinal: Negative for abdominal pain, blood in stool, constipation, diarrhea, heartburn and melena.  Genitourinary: Negative.   Skin: Negative.   Neurological: Negative for dizziness, sensory change, loss of consciousness and headaches.  Psychiatric/Behavioral: Negative for depression. The patient is not nervous/anxious and does not have insomnia.     Family history- Review and unchanged  Social history- Review and  unchanged  Physical Exam: BP 118/68   Pulse 88   Temp 98 F (36.7 C) (Temporal)   Resp 16   Ht 5\' 3"  (1.6 m)   Wt 165 lb (74.8 kg)   BMI 29.23 kg/m  Wt Readings from Last 3 Encounters:  12/20/16 165 lb (74.8 kg)  12/07/16 165 lb 3.2 oz (74.9 kg)  10/05/16 160 lb (72.6 kg)   General Appearance: Well nourished well developed, non-toxic appearing, in no apparent distress. Eyes: PERRLA, EOMs, conjunctiva no swelling or erythema ENT/Mouth: Ear canals clear with no erythema, swelling, or discharge.  TMs normal bilaterally, oropharynx clear, moist, with no exudate.   Neck: Supple, thyroid normal, no JVD, no cervical adenopathy.  Respiratory: Respiratory effort normal, breath sounds clear A&P, no wheeze, rhonchi or rales noted.  No retractions, no accessory muscle usage Cardio: RRR with no MRGs. No noted edema.  Abdomen:  Soft, + BS.  Non tender, no guarding, rebound, hernias, masses. Musculoskeletal: Full ROM, 5/5 strength, Normal gait Skin: Warm, dry without rashes, lesions, ecchymosis.  GU:  Normal external female genitalia.  Atrophic vaginal changes secondary to age.  Dry vaginal mucosa.  Ovaries not palpable.  No CMT.  NO palpable uterine masses Neuro: Awake and oriented X 3, Cranial nerves intact. No cerebellar symptoms.  Psych: normal affect, Insight and Judgment appropriate.    Terri Piedra, PA-C 9:13 AM [facility]

## 2016-12-25 LAB — CYTOLOGY - PAP
DIAGNOSIS: NEGATIVE
HPV (WINDOPATH): DETECTED — AB

## 2016-12-26 ENCOUNTER — Other Ambulatory Visit: Payer: Self-pay | Admitting: Internal Medicine

## 2016-12-26 MED ORDER — FLUCONAZOLE 150 MG PO TABS: 150.0000 mg | ORAL_TABLET | Freq: Once | ORAL | 0 refills | Status: AC

## 2017-01-02 ENCOUNTER — Other Ambulatory Visit: Payer: Self-pay | Admitting: Internal Medicine

## 2017-01-02 DIAGNOSIS — J069 Acute upper respiratory infection, unspecified: Secondary | ICD-10-CM

## 2017-01-30 ENCOUNTER — Other Ambulatory Visit: Payer: Self-pay | Admitting: Internal Medicine

## 2017-03-02 ENCOUNTER — Other Ambulatory Visit: Payer: Self-pay | Admitting: Internal Medicine

## 2017-03-25 NOTE — Progress Notes (Signed)
Assessment and Plan:  Hypertension:  -most well controlled BP I have ever seen with patient -Continue medication -monitor blood pressure at home. -Continue DASH diet -Reminder to go to the ER if any CP, SOB, nausea, dizziness, severe HA, changes vision/speech, left arm numbness and tingling and jaw pain.  Cholesterol - Continue diet and exercise -Check cholesterol.   PreDiabetes -Continue diet and exercise.  -Check A1C  Vitamin D Def -continue medications.   Right finger OA Check uric acid, mobic x 2 weeks, no other joints, likely OA  Continue diet and meds as discussed. Further disposition pending results of labs. Discussed med's effects and SE's.   Future Appointments Date Time Provider Department Center  05/23/2017 2:00 PM Courtney Forcucci, PA-C [id number] None     HPI 61 y.o. AA female  presents for 3 month follow up with hypertension, hyperlipidemia, diabetes and vitamin D deficiency.  Right handed female, right middle finger pain, swelling, pain with PIP joint, worse with bending it, x 1 month. No more swelling, no redness. No stiffness in the AM, no other joints with swelling, warmth, redness.    Her blood pressure has been controlled at home, today their BP is BP: 118/74.She does not workout. She denies chest pain, shortness of breath, dizziness.     She is on cholesterol medication and denies myalgias. Her cholesterol is at goal. The cholesterol was:  12/20/2016: Cholesterol 201; HDL 53; LDL Cholesterol 112; Triglycerides 182   She has been working on diet and exercise for prediabetes without complications, and denies  foot ulcerations, hyperglycemia, hypoglycemia , increased appetite, nausea, paresthesia of the feet, polydipsia, polyuria, visual disturbances, vomiting and weight loss. Last A1C was: 12/20/2016: Hgb A1c MFr Bld 6.1   Patient is on Vitamin D supplement. 05/22/2016: Vit D, 25-Hydroxy 41  BMI is Body mass index is 29.58 kg/m., she is working on diet and  exercise. Wt Readings from Last 3 Encounters:  03/26/17 167 lb (75.8 kg)  12/20/16 165 lb (74.8 kg)  12/07/16 165 lb 3.2 oz (74.9 kg)     Current Medications:  Current Outpatient Prescriptions on File Prior to Visit  Medication Sig Dispense Refill  . albuterol (VENTOLIN HFA) 108 (90 Base) MCG/ACT inhaler Inhale 2 puffs into the lungs every 4 (four) hours as needed for wheezing or shortness of breath. 1 Inhaler 0  . amLODipine (NORVASC) 5 MG tablet TAKE ONE TABLET BY MOUTH DAILY 30 tablet 0  . aspirin 81 MG tablet Take 81 mg by mouth daily.    Marland Kitchen azelastine (ASTELIN) 0.1 % nasal spray PLACE 2 SPRAYS INTO BOTH NOSTRILS 2 (TWO) TIMES DAILY. USE IN EACH NOSTRIL AS DIRECTED 30 mL 1  . Biotin 300 MCG TABS Take 1 tablet by mouth at bedtime.     . budesonide-formoterol (SYMBICORT) 80-4.5 MCG/ACT inhaler Inhale 2 puffs into the lungs 2 (two) times daily. 2 Inhaler 0  . Cholecalciferol (VITAMIN D3) 2000 UNITS TABS Take 2 tablets by mouth at bedtime.     . cloNIDine (CATAPRES) 0.1 MG tablet TAKE 1 TABLET (0.1 MG TOTAL) BY MOUTH DAILY. 90 tablet 0  . cyclobenzaprine (FLEXERIL) 10 MG tablet Take 1 tablet (10 mg total) by mouth 3 (three) times daily. 90 tablet 0  . escitalopram (LEXAPRO) 20 MG tablet Take 1 tablet (20 mg total) by mouth daily. 90 tablet 2  . EVENING PRIMROSE OIL PO Take by mouth daily.    . fluticasone (FLONASE) 50 MCG/ACT nasal spray SPRAY TWO SPRAYS IN EACH NOSTRIL ONCE DAILY  16 g 0  . hydrochlorothiazide (HYDRODIURIL) 25 MG tablet TAKE 1 TABLET (25 MG TOTAL) BY MOUTH DAILY. 90 tablet 0  . latanoprost (XALATAN) 0.005 % ophthalmic solution Place 1 drop into both eyes at bedtime.    Marland Kitchen loratadine (CLARITIN) 10 MG tablet Take 10 mg by mouth daily as needed for allergies.     . Magnesium 250 MG TABS Take 1 tablet by mouth 2 (two) times daily.     Marland Kitchen nystatin (MYCOSTATIN) 100000 UNIT/ML suspension Take 10 mL and swish and hold in mouth for 30 seconds.  Then spit out.  Try not to drink or eat  for 30 minutes after. 180 mL 0  . Omega-3 Fatty Acids (OMEGA 3 PO) Take 1 capsule by mouth 2 (two) times daily.     . pravastatin (PRAVACHOL) 40 MG tablet TAKE 1 TABLET (40 MG TOTAL) BY MOUTH DAILY. 90 tablet 3  . promethazine-dextromethorphan (PROMETHAZINE-DM) 6.25-15 MG/5ML syrup Take 5-10 ml PO q8hrs prn for cough 180 mL 1   No current facility-administered medications on file prior to visit.    Medical History:  Past Medical History:  Diagnosis Date  . Allergy   . Anxiety   . Depression   . Esophageal stricture   . GERD (gastroesophageal reflux disease)   . Hyperlipemia   . Hypertension   . Unspecified vitamin D deficiency    Allergies:  Allergies  Allergen Reactions  . 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 Clinoril [Sulindac] Rash     Review of Systems:  Review of Systems  Constitutional: Negative for chills, fever and malaise/fatigue.  HENT: Negative for congestion, ear pain and sore throat.   Eyes: Negative.   Respiratory: Negative for cough, shortness of breath and wheezing.   Cardiovascular: Negative for chest pain, palpitations and leg swelling.  Gastrointestinal: Negative for abdominal pain, blood in stool, constipation, diarrhea, heartburn and melena.  Genitourinary: Negative.   Skin: Negative.   Neurological: Negative for dizziness, sensory change, loss of consciousness and headaches.  Psychiatric/Behavioral: Negative for depression. The patient is not nervous/anxious and does not have insomnia.     Family history- Review and unchanged  Social history- Review and unchanged  Physical Exam: BP 118/74   Pulse 97   Temp 98.7 F (37.1 C)   Ht  (1.6 m)   Wt 167 lb (75.8 kg)   BMI 29.58 kg/m  Wt Readings from Last 3 Encounters:  03/26/17 167 lb (75.8 kg)  12/20/16 165 lb (74.8 kg)  12/07/16 165 lb 3.2 oz (74.9 kg)   General Appearance: Well nourished well developed, non-toxic appearing, in no apparent  distress. Eyes: PERRLA, EOMs, conjunctiva no swelling or erythema ENT/Mouth: Ear canals clear with no erythema, swelling, or discharge.  TMs normal bilaterally, oropharynx clear, moist, with no exudate.   Neck: Supple, thyroid normal, no JVD, no cervical adenopathy.  Respiratory: Respiratory effort normal, breath sounds clear A&P, no wheeze, rhonchi or rales noted.  No retractions, no accessory muscle usage Cardio: RRR with no MRGs. No noted edema.  Abdomen: Soft, + BS.  Non tender, no guarding, rebound, hernias, masses. Musculoskeletal: Full ROM, 5/5 strength, Normal gait Skin: Warm, dry without rashes, lesions, ecchymosis.  GU:  Normal external female genitalia.  Atrophic vaginal changes secondary to age.  Dry vaginal mucosa.  Ovaries not palpable.  No CMT.  NO palpable uterine masses Neuro: Awake and oriented X 3, Cranial nerves intact. No cerebellar symptoms.  Psych: normal affect, Insight and Judgment appropriate.    Quentin Mulling, PA-C 11:18 AM [facility]

## 2017-03-26 ENCOUNTER — Ambulatory Visit: Payer: Self-pay | Admitting: Internal Medicine

## 2017-03-26 ENCOUNTER — Encounter: Payer: Self-pay | Admitting: Physician Assistant

## 2017-03-26 ENCOUNTER — Ambulatory Visit (INDEPENDENT_AMBULATORY_CARE_PROVIDER_SITE_OTHER): Payer: Managed Care, Other (non HMO) | Admitting: Physician Assistant

## 2017-03-26 VITALS — BP 118/74 | HR 97 | Temp 98.7°F | Ht 63.0 in | Wt 167.0 lb

## 2017-03-26 DIAGNOSIS — R7303 Prediabetes: Secondary | ICD-10-CM

## 2017-03-26 DIAGNOSIS — E559 Vitamin D deficiency, unspecified: Secondary | ICD-10-CM

## 2017-03-26 DIAGNOSIS — E782 Mixed hyperlipidemia: Secondary | ICD-10-CM

## 2017-03-26 DIAGNOSIS — F419 Anxiety disorder, unspecified: Secondary | ICD-10-CM

## 2017-03-26 DIAGNOSIS — F3341 Major depressive disorder, recurrent, in partial remission: Secondary | ICD-10-CM

## 2017-03-26 DIAGNOSIS — I10 Essential (primary) hypertension: Secondary | ICD-10-CM

## 2017-03-26 DIAGNOSIS — M79644 Pain in right finger(s): Secondary | ICD-10-CM

## 2017-03-26 DIAGNOSIS — Z79899 Other long term (current) drug therapy: Secondary | ICD-10-CM | POA: Diagnosis not present

## 2017-03-26 LAB — CBC WITH DIFFERENTIAL/PLATELET
Basophils Absolute: 0 cells/uL (ref 0–200)
Basophils Relative: 0 %
Eosinophils Absolute: 147 cells/uL (ref 15–500)
Eosinophils Relative: 3 %
HCT: 43.7 % (ref 35.0–45.0)
HEMOGLOBIN: 15 g/dL (ref 11.7–15.5)
LYMPHS ABS: 1911 {cells}/uL (ref 850–3900)
Lymphocytes Relative: 39 %
MCH: 32.2 pg (ref 27.0–33.0)
MCHC: 34.3 g/dL (ref 32.0–36.0)
MCV: 93.8 fL (ref 80.0–100.0)
MPV: 9.7 fL (ref 7.5–12.5)
Monocytes Absolute: 343 cells/uL (ref 200–950)
Monocytes Relative: 7 %
NEUTROS ABS: 2499 {cells}/uL (ref 1500–7800)
Neutrophils Relative %: 51 %
Platelets: 207 10*3/uL (ref 140–400)
RBC: 4.66 MIL/uL (ref 3.80–5.10)
RDW: 13.7 % (ref 11.0–15.0)
WBC: 4.9 10*3/uL (ref 3.8–10.8)

## 2017-03-26 LAB — BASIC METABOLIC PANEL WITH GFR
BUN: 11 mg/dL (ref 7–25)
CALCIUM: 9.9 mg/dL (ref 8.6–10.4)
CHLORIDE: 103 mmol/L (ref 98–110)
CO2: 30 mmol/L (ref 20–31)
Creat: 0.71 mg/dL (ref 0.50–1.05)
GFR, Est African American: >89 mL/min (ref >=60)
Glucose, Bld: 91 mg/dL (ref 65–99)
Potassium: 3.3 mmol/L — ABNORMAL LOW (ref 3.5–5.3)
SODIUM: 142 mmol/L (ref 135–146)

## 2017-03-26 LAB — LIPID PANEL
CHOL/HDL RATIO: 4.6 ratio (ref <5.0)
CHOLESTEROL: 197 mg/dL (ref <200)
HDL: 43 mg/dL — AB (ref >50)
LDL Cholesterol: 100 mg/dL — ABNORMAL HIGH (ref <100)
Triglycerides: 272 mg/dL — ABNORMAL HIGH (ref <150)
VLDL: 54 mg/dL — AB (ref <30)

## 2017-03-26 LAB — HEPATIC FUNCTION PANEL
ALT: 24 U/L (ref 6–29)
AST: 24 U/L (ref 10–35)
Albumin: 4.5 g/dL (ref 3.6–5.1)
Alkaline Phosphatase: 91 U/L (ref 33–130)
BILIRUBIN DIRECT: 0 mg/dL (ref <=0.2)
BILIRUBIN INDIRECT: 0.3 mg/dL (ref 0.2–1.2)
Total Bilirubin: 0.3 mg/dL (ref 0.2–1.2)
Total Protein: 7.1 g/dL (ref 6.1–8.1)

## 2017-03-26 LAB — TSH: TSH: 1.08 mIU/L

## 2017-03-26 MED ORDER — MELOXICAM 15 MG PO TABS: ORAL_TABLET | ORAL | 1 refills | Status: DC

## 2017-03-26 MED ORDER — RANITIDINE HCL 300 MG PO TABS: 300.0000 mg | ORAL_TABLET | Freq: Every day | ORAL | 2 refills | Status: DC

## 2017-03-26 NOTE — Patient Instructions (Signed)
Vionic shoes look up  Arthritis Arthritis means joint pain. It can also mean joint disease. A joint is a place where bones come together. People who have arthritis may have:  Red joints.  Swollen joints.  Stiff joints.  Warm joints.  A fever.  A feeling of being sick. Follow these instructions at home: Pay attention to any changes in your symptoms. Take these actions to help with your pain and swelling. Medicines   Take over-the-counter and prescription medicines only as told by your doctor.  Do not take aspirin for pain if your doctor says that you may have gout. Activity   Rest your joint if your doctor tells you to.  Avoid activities that make the pain worse.  Exercise your joint regularly as told by your doctor. Try doing exercises like:  Swimming.  Water aerobics.  Biking.  Walking. Joint Care    If your joint is swollen, keep it raised (elevated) if told by your doctor.  If your joint feels stiff in the morning, try taking a warm shower.  If you have diabetes, do not apply heat without asking your doctor.  If told, apply heat to the joint:  Put a towel between the joint and the hot pack or heating pad.  Leave the heat on the area for 20-30 minutes.  If told, apply ice to the joint:  Put ice in a plastic bag.  Place a towel between your skin and the bag.  Leave the ice on for 20 minutes, 2-3 times per day.  Keep all follow-up visits as told by your doctor. Contact a doctor if:  The pain gets worse.  You have a fever. Get help right away if:  You have very bad pain in your joint.  You have swelling in your joint.  Your joint is red.  Many joints become painful and swollen.  You have very bad back pain.  Your leg is very weak.  You cannot control your pee (urine) or poop (stool). This information is not intended to replace advice given to you by your health care provider. Make sure you discuss any questions you have with your health  care provider. Document Released: 02/21/2010 Document Revised: 05/04/2016 Document Reviewed: 02/22/2015 Elsevier Interactive Patient Education  2017 [employer].

## 2017-03-27 ENCOUNTER — Other Ambulatory Visit: Payer: Self-pay | Admitting: Physician Assistant

## 2017-03-27 ENCOUNTER — Encounter: Payer: Self-pay | Admitting: Physician Assistant

## 2017-03-27 DIAGNOSIS — Z79899 Other long term (current) drug therapy: Secondary | ICD-10-CM

## 2017-03-27 LAB — URIC ACID: Uric Acid, Serum: 4.9 mg/dL (ref 2.5–7.0)

## 2017-03-27 LAB — HEMOGLOBIN A1C
Hgb A1c MFr Bld: 6 % — ABNORMAL HIGH (ref <5.7)
MEAN PLASMA GLUCOSE: 126 mg/dL

## 2017-03-27 LAB — MAGNESIUM: MAGNESIUM: 1.9 mg/dL (ref 1.5–2.5)

## 2017-03-27 LAB — VITAMIN D 25 HYDROXY (VIT D DEFICIENCY, FRACTURES): Vit D, 25-Hydroxy: 45 ng/mL (ref 30–100)

## 2017-05-13 ENCOUNTER — Other Ambulatory Visit: Payer: Self-pay | Admitting: Internal Medicine

## 2017-05-23 ENCOUNTER — Encounter: Payer: Self-pay | Admitting: Internal Medicine

## 2017-06-08 ENCOUNTER — Other Ambulatory Visit: Payer: Self-pay | Admitting: Internal Medicine

## 2017-06-08 DIAGNOSIS — J069 Acute upper respiratory infection, unspecified: Secondary | ICD-10-CM

## 2017-06-08 MED ORDER — PROMETHAZINE-DM 6.25-15 MG/5ML PO SYRP: ORAL_SOLUTION | ORAL | 2 refills | Status: AC

## 2017-06-26 NOTE — Progress Notes (Signed)
Complete Physical  Assessment and Plan:   Encounter for general adult medical examination with abnormal findings  Essential hypertension - continue medications, DASH diet, exercise and monitor at home. Call if greater than 130/80.  -     CBC with Differential/Platelet -     BASIC METABOLIC PANEL WITH GFR -     Hepatic function panel -     TSH -     Urinalysis, Routine w reflex microscopic -     Microalbumin / creatinine urine ratio -     EKG 12-Lead  Mixed hyperlipidemia -continue medications, check lipids, decrease fatty foods, increase activity.  -     Lipid panel  Medication management -     Magnesium  Recurrent major depressive disorder, in partial remission (HCC) - continue medications, stress management techniques discussed, increase water, good sleep hygiene discussed, increase exercise, and increase veggies.   Vitamin D deficiency Continue supplement  Anxiety stress management techniques discussed, increase water, good sleep hygiene discussed, increase exercise, and increase veggies.   ESOPHAGEAL STRICTURE Continue PPI/H2 blocker, diet discussed  Gastroesophageal reflux disease, esophagitis presence not specified Continue PPI/H2 blocker, diet discussed  HPV in female -     Cytology - PAP  Prediabetes -     Hemoglobin A1c  Anemia, unspecified type -     Iron and TIBC -     Vitamin B12  Screening for HIV (human immunodeficiency virus) -     HIV antibody  Left hand pain -     Ambulatory referral to Orthopedics  Hot flashes due to menopause -     gabapentin (NEURONTIN) 100 MG capsule; 1 or 3 pills at night for hot flashes   Discussed med's effects and SE's. Screening labs and tests as requested with regular follow-up as recommended.  HPI  61 y.o. female  presents for a complete physical.  She is right handed, complains of left hand pain, states that she was washing her hair when her left thumb and 1st, 2nd fingers were fixed in flexed position and  she had to physically extend her fingers, no pain now, has some aching in that hand but denies numbness/tingling.   Her blood pressure has been controlled at home, today their BP is BP: 128/84.  She does not workout. She denies chest pain, shortness of breath, dizziness.   She is on cholesterol medication and denies myalgias. Her cholesterol is at goal. The cholesterol last visit was:  Lab Results  Component Value Date   CHOL 197 03/26/2017   HDL 43 (L) 03/26/2017   LDLCALC 100 (H) 03/26/2017   TRIG 272 (H) 03/26/2017   CHOLHDL 4.6 03/26/2017  . She has been working on diet and exercise for prediabetes,  and denies foot ulcerations, hyperglycemia, hypoglycemia , increased appetite, nausea, paresthesia of the feet, polydipsia, polyuria, visual disturbances, vomiting and weight loss. Last A1C in the office was:  Lab Results  Component Value Date   HGBA1C 6.0 (H) 03/26/2017   Patient is on Vitamin D supplement.   Lab Results  Component Value Date   VD25OH 45 03/26/2017     BMI is Body mass index is 27.9 kg/m., she is working on diet and exercise. Wt Readings from Last 3 Encounters:  06/28/17 160 lb (72.6 kg)  03/26/17 167 lb (75.8 kg)  12/20/16 165 lb (74.8 kg)     Current Medications:  Current Outpatient Prescriptions on File Prior to Visit  Medication Sig Dispense Refill  . albuterol (VENTOLIN HFA) 108 (90 Base)  MCG/ACT inhaler Inhale 2 puffs into the lungs every 4 (four) hours as needed for wheezing or shortness of breath. 1 Inhaler 0  . aspirin 81 MG tablet Take 81 mg by mouth daily.    Marland Kitchen. azelastine (ASTELIN) 0.1 % nasal spray PLACE 2 SPRAYS INTO BOTH NOSTRILS 2 (TWO) TIMES DAILY. USE IN EACH NOSTRIL AS DIRECTED 30 mL 1  . Biotin 300 MCG TABS Take 1 tablet by mouth at bedtime.     . budesonide-formoterol (SYMBICORT) 80-4.5 MCG/ACT inhaler Inhale 2 puffs into the lungs 2 (two) times daily. 2 Inhaler 0  . Cholecalciferol (VITAMIN D3) 2000 UNITS TABS Take 2 tablets by mouth at  bedtime.     . cloNIDine (CATAPRES) 0.1 MG tablet TAKE 1 TABLET (0.1 MG TOTAL) BY MOUTH DAILY. 90 tablet 0  . cyclobenzaprine (FLEXERIL) 10 MG tablet Take 1 tablet (10 mg total) by mouth 3 (three) times daily. 90 tablet 0  . escitalopram (LEXAPRO) 20 MG tablet Take 1 tablet (20 mg total) by mouth daily. 90 tablet 2  . EVENING PRIMROSE OIL PO Take by mouth daily.    . fluticasone (FLONASE) 50 MCG/ACT nasal spray SPRAY TWO SPRAYS IN EACH NOSTRIL ONCE DAILY 16 g 0  . hydrochlorothiazide (HYDRODIURIL) 25 MG tablet TAKE 1 TABLET (25 MG TOTAL) BY MOUTH DAILY. 90 tablet 0  . latanoprost (XALATAN) 0.005 % ophthalmic solution Place 1 drop into both eyes at bedtime.    Marland Kitchen. loratadine (CLARITIN) 10 MG tablet Take 10 mg by mouth daily as needed for allergies.     . Magnesium 250 MG TABS Take 1 tablet by mouth 2 (two) times daily.     Marland Kitchen. nystatin (MYCOSTATIN) 100000 UNIT/ML suspension Take 10 mL and swish and hold in mouth for 30 seconds.  Then spit out.  Try not to drink or eat for 30 minutes after. 180 mL 0  . Omega-3 Fatty Acids (OMEGA 3 PO) Take 1 capsule by mouth 2 (two) times daily.     . pravastatin (PRAVACHOL) 40 MG tablet TAKE 1 TABLET (40 MG TOTAL) BY MOUTH DAILY. 90 tablet 3  . promethazine-dextromethorphan (PROMETHAZINE-DM) 6.25-15 MG/5ML syrup Take 1 to 2 teaspoons 4 x/ day if needed for cough 240 mL 2  . ranitidine (ZANTAC) 300 MG tablet Take 1 tablet (300 mg total) by mouth at bedtime. 90 tablet 2   No current facility-administered medications on file prior to visit.     Health Maintenance:   Immunization History  Administered Date(s) Administered  . Influenza Split 08/30/2015  . Pneumococcal-Unspecified 12/11/2008  . Td 12/11/2005  . Tdap 12/30/2015   Tetanus:2017 Pneumovax: 2010 Flu vaccine: 2017 Shingles 2018  LMP: postmenopausal Pap:2017 DUE + HPV MGM:2015 DUE given number  DEXA:defer Colonoscopy:2011 EGD:2015 Last Dental Exam: 2018 Last Eye Exam: 2018  Patient Care  Team: Lucky CowboyMcKeown, William, MD as PCP - General (Internal Medicine) Louis MeckelKaplan, Robert D, MD as Consulting Physician (Gastroenterology) Shea EvansMody, Vaishali, MD as Consulting Physician (Obstetrics and Gynecology) Nadara Mustarduda, Marcus V, MD as Consulting Physician (Orthopedic Surgery)  Medical History:  Past Medical History:  Diagnosis Date  . Allergy   . Anxiety   . Depression   . Esophageal stricture   . GERD (gastroesophageal reflux disease)   . Hyperlipemia   . Hypertension   . Unspecified vitamin D deficiency    Allergies Allergies  Allergen Reactions  . 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 Clinoril [Sulindac] Rash  SURGICAL HISTORY She  has a past surgical history that includes Cholecystectomy; Esophageal dilation (2009); and LEEP (2004). FAMILY HISTORY Her family history includes Cancer (age of onset: 40) in her sister; Diabetes in her sister; Glaucoma in her mother and sister; Hyperlipidemia in her mother; Hypertension in her brother, mother, and sister; Stroke in her mother. SOCIAL HISTORY She  reports that she quit smoking about 15 years ago. Her smoking use included Cigarettes. She has a 1.50 pack-year smoking history. She has never used smokeless tobacco. She reports that she does not drink alcohol or use drugs.  Review of Systems: Review of Systems  Constitutional: Negative for chills, diaphoresis, fever and malaise/fatigue.  HENT: Negative for congestion, ear pain and sore throat.   Eyes: Negative.   Respiratory: Negative for cough, shortness of breath and wheezing.   Cardiovascular: Negative for chest pain, palpitations and leg swelling.  Gastrointestinal: Negative for abdominal pain, blood in stool, constipation, diarrhea, heartburn and melena.  Genitourinary: Negative.   Musculoskeletal: Positive for joint pain. Negative for back pain, falls, myalgias and neck pain.  Skin: Negative.   Neurological: Negative for dizziness, sensory  change, loss of consciousness and headaches.  Psychiatric/Behavioral: Negative for depression. The patient is not nervous/anxious and does not have insomnia.     Physical Exam: Estimated body mass index is 27.9 kg/m as calculated from the following:   Height as of this encounter: 5' 3.5" (1.613 m).   Weight as of this encounter: 160 lb (72.6 kg). BP 128/84   Pulse 91   Temp 98.1 F (36.7 C)   Resp 14   Ht 5' 3.5" (1.613 m)   Wt 160 lb (72.6 kg)   SpO2 98%   BMI 27.90 kg/m   General Appearance: Well nourished well developed, in no apparent distress.  Eyes: PERRLA, EOMs, conjunctiva no swelling or erythema ENT/Mouth: Ear canals normal without obstruction, swelling, erythema, or discharge.  TMs normal bilaterally with no erythema, bulging, retraction, or loss of landmark.  Oropharynx moist and clear with no exudate, erythema, or swelling.   Neck: Supple, thyroid normal. No bruits.  No cervical adenopathy Respiratory: Respiratory effort normal, Breath sounds clear A&P without wheeze, rhonchi, rales.   Cardio: RRR without murmurs, rubs or gallops. Brisk peripheral pulses without edema.  Chest: symmetric, with normal excursions Breasts: Symmetric, without lumps, nipple discharge, retractions.  Abdomen: Soft, nontender, no guarding, rebound, hernias, masses, or organomegaly.  Lymphatics: Non tender without lymphadenopathy.  Genitourinary:  Musculoskeletal: Full ROM all peripheral extremities,5/5 strength, and normal gait.  Skin: Warm, dry without rashes, lesions, ecchymosis. Neuro: Awake and oriented X 3, Cranial nerves intact, reflexes equal bilaterally. Normal muscle tone, no cerebellar symptoms. Sensation intact.  Psych:  normal affect, Insight and Judgment appropriate.   EKG: WNL no changes. AORTA SCAN: defer  Over 40 minutes of exam, counseling, chart review and critical decision making was performed  Quentin Mulling 9:36 AM [facility]

## 2017-06-28 ENCOUNTER — Encounter (INDEPENDENT_AMBULATORY_CARE_PROVIDER_SITE_OTHER): Payer: Self-pay

## 2017-06-28 ENCOUNTER — Encounter: Payer: Self-pay | Admitting: Physician Assistant

## 2017-06-28 ENCOUNTER — Ambulatory Visit (INDEPENDENT_AMBULATORY_CARE_PROVIDER_SITE_OTHER): Payer: Managed Care, Other (non HMO) | Admitting: Physician Assistant

## 2017-06-28 ENCOUNTER — Other Ambulatory Visit (HOSPITAL_COMMUNITY)
Admission: RE | Admit: 2017-06-28 | Discharge: 2017-06-28 | Disposition: A | Discharge status: Home / Self Care | Payer: Managed Care, Other (non HMO) | Source: Ambulatory Visit | Attending: Physician Assistant | Admitting: Physician Assistant

## 2017-06-28 ENCOUNTER — Other Ambulatory Visit: Payer: Self-pay

## 2017-06-28 VITALS — BP 128/84 | HR 91 | Temp 98.1°F | Resp 14 | Ht 63.5 in | Wt 160.0 lb

## 2017-06-28 DIAGNOSIS — R7303 Prediabetes: Secondary | ICD-10-CM

## 2017-06-28 DIAGNOSIS — F419 Anxiety disorder, unspecified: Secondary | ICD-10-CM

## 2017-06-28 DIAGNOSIS — K219 Gastro-esophageal reflux disease without esophagitis: Secondary | ICD-10-CM

## 2017-06-28 DIAGNOSIS — B977 Papillomavirus as the cause of diseases classified elsewhere: Secondary | ICD-10-CM | POA: Insufficient documentation

## 2017-06-28 DIAGNOSIS — Z136 Encounter for screening for cardiovascular disorders: Secondary | ICD-10-CM | POA: Diagnosis not present

## 2017-06-28 DIAGNOSIS — Z0001 Encounter for general adult medical examination with abnormal findings: Secondary | ICD-10-CM

## 2017-06-28 DIAGNOSIS — Z1159 Encounter for screening for other viral diseases: Secondary | ICD-10-CM

## 2017-06-28 DIAGNOSIS — F3341 Major depressive disorder, recurrent, in partial remission: Secondary | ICD-10-CM

## 2017-06-28 DIAGNOSIS — M79642 Pain in left hand: Secondary | ICD-10-CM

## 2017-06-28 DIAGNOSIS — D649 Anemia, unspecified: Secondary | ICD-10-CM

## 2017-06-28 DIAGNOSIS — I10 Essential (primary) hypertension: Secondary | ICD-10-CM

## 2017-06-28 DIAGNOSIS — K222 Esophageal obstruction: Secondary | ICD-10-CM

## 2017-06-28 DIAGNOSIS — Z79899 Other long term (current) drug therapy: Secondary | ICD-10-CM | POA: Diagnosis not present

## 2017-06-28 DIAGNOSIS — Z0000 Encounter for general adult medical examination without abnormal findings: Secondary | ICD-10-CM

## 2017-06-28 DIAGNOSIS — Z114 Encounter for screening for human immunodeficiency virus [HIV]: Secondary | ICD-10-CM

## 2017-06-28 DIAGNOSIS — E782 Mixed hyperlipidemia: Secondary | ICD-10-CM

## 2017-06-28 DIAGNOSIS — J069 Acute upper respiratory infection, unspecified: Secondary | ICD-10-CM

## 2017-06-28 DIAGNOSIS — E559 Vitamin D deficiency, unspecified: Secondary | ICD-10-CM

## 2017-06-28 DIAGNOSIS — M79644 Pain in right finger(s): Secondary | ICD-10-CM

## 2017-06-28 DIAGNOSIS — N951 Menopausal and female climacteric states: Secondary | ICD-10-CM

## 2017-06-28 LAB — CBC WITH DIFFERENTIAL/PLATELET
Basophils Absolute: 0 cells/uL (ref 0–200)
Basophils Relative: 0 %
EOS PCT: 2 %
Eosinophils Absolute: 130 cells/uL (ref 15–500)
HCT: 42.3 % (ref 35.0–45.0)
HEMOGLOBIN: 14.6 g/dL (ref 11.7–15.5)
LYMPHS ABS: 2730 {cells}/uL (ref 850–3900)
Lymphocytes Relative: 42 %
MCH: 32.7 pg (ref 27.0–33.0)
MCHC: 34.5 g/dL (ref 32.0–36.0)
MCV: 94.6 fL (ref 80.0–100.0)
MONO ABS: 650 {cells}/uL (ref 200–950)
MPV: 9.8 fL (ref 7.5–12.5)
Monocytes Relative: 10 %
Neutro Abs: 2990 cells/uL (ref 1500–7800)
Neutrophils Relative %: 46 %
PLATELETS: 207 10*3/uL (ref 140–400)
RBC: 4.47 MIL/uL (ref 3.80–5.10)
RDW: 13 % (ref 11.0–15.0)
WBC: 6.5 10*3/uL (ref 3.8–10.8)

## 2017-06-28 LAB — TSH: TSH: 1.43 m[IU]/L

## 2017-06-28 MED ORDER — GABAPENTIN 100 MG PO CAPS: ORAL_CAPSULE | ORAL | 0 refills | Status: DC

## 2017-06-28 MED ORDER — MELOXICAM 15 MG PO TABS: ORAL_TABLET | ORAL | 1 refills | Status: DC

## 2017-06-28 MED ORDER — AMLODIPINE BESYLATE 5 MG PO TABS: 5.0000 mg | ORAL_TABLET | Freq: Every day | ORAL | 0 refills | Status: DC

## 2017-06-28 NOTE — Patient Instructions (Addendum)
[facility]  7 a.m.-6:30 p.m., Monday 7 a.m.-5 p.m., Tuesday-Friday Schedule an appointment by calling [phone].  Encourage you to get the 3D Mammogram  The 3D Mammogram is much more specific and sensitive to pick up breast cancer. For women with fibrocystic breast or lumpy breast it can be hard to determine if it is cancer or not but the 3D mammogram is able to tell this difference which cuts back on unneeded additional tests or scary call backs.   - over 40% increase in detection of breast cancer - over 40% reduction in false positives.  - fewer call backs - reduced anxiety - improved outcomes - [employer] [employer]     Simple math prevails.    1st - exercise does not produce significant weight loss - at best one converts fat into muscle , "bulks up", loses inches, but usually stays "weight neutral"     2nd - think of your body weightas a check book: If you eat more calories than you burn up - you save money or gain weight .... Or if you spend more money than you put in the check book, ie burn up more calories than you eat, then you lose weight     3rd - if you walk or run 1 mile, you burn up 100 calories - you have to burn up 3,500 calories to lose 1 pound, ie you have to walk/run 35 miles to lose 1 measly pound. So if you want to lose 10 #, then you have to walk/run 350 miles, so.... clearly exercise is not the solution.     4. So if you consume 1,500 calories, then you have to burn up the equivalent of 15 miles to stay weight neutral - It also stands to reason that if you consume 1,500 cal/day and don't lose weight, then you must be burning up about 1,500 cals/day to stay weight neutral.     5. If you really want to lose weight, you must cut your calorie intake 300 calories /day and at that rate you should lose about 1 # every 3 days.   6. Please purchase Dr Francis DowseJoel Fuhrman's book(s) "The End of Dieting" & "Eat to Live" . It has some great concepts and  recipes.        Bad carbs also include fruit juice, alcohol, and sweet tea. These are empty calories that do not signal to your brain that you are full.   Please remember the good carbs are still carbs which convert into sugar. So please measure them out no more than 1/2-1 cup of rice, oatmeal, pasta, and beans  Veggies are however free foods! Pile them on.   Not all fruit is created equal. Please see the list below, the fruit at the bottom is higher in sugars than the fruit at the top. Please avoid all dried fruits.

## 2017-06-29 ENCOUNTER — Other Ambulatory Visit: Payer: Self-pay | Admitting: Physician Assistant

## 2017-06-29 DIAGNOSIS — E876 Hypokalemia: Secondary | ICD-10-CM

## 2017-06-29 LAB — HEPATIC FUNCTION PANEL
ALBUMIN: 4.4 g/dL (ref 3.6–5.1)
ALK PHOS: 81 U/L (ref 33–130)
ALT: 14 U/L (ref 6–29)
AST: 16 U/L (ref 10–35)
Bilirubin, Direct: 0.1 mg/dL (ref <=0.2)
Indirect Bilirubin: 0.2 mg/dL (ref 0.2–1.2)
TOTAL PROTEIN: 7.1 g/dL (ref 6.1–8.1)
Total Bilirubin: 0.3 mg/dL (ref 0.2–1.2)

## 2017-06-29 LAB — HEMOGLOBIN A1C
HEMOGLOBIN A1C: 5.7 % — AB (ref <5.7)
Mean Plasma Glucose: 117 mg/dL

## 2017-06-29 LAB — MICROALBUMIN / CREATININE URINE RATIO
CREATININE, URINE: 130 mg/dL (ref 20–320)
Microalb Creat Ratio: 24 mcg/mg creat (ref <30)
Microalb, Ur: 3.1 mg/dL

## 2017-06-29 LAB — URINALYSIS, MICROSCOPIC ONLY
BACTERIA UA: NONE SEEN [HPF]
Casts: NONE SEEN [LPF]
Crystals: NONE SEEN [HPF]
SQUAMOUS EPITHELIAL / LPF: NONE SEEN [HPF] (ref <=5)
WBC, UA: >60 WBC/HPF — AB (ref <=5)
Yeast: NONE SEEN [HPF]

## 2017-06-29 LAB — URINALYSIS, ROUTINE W REFLEX MICROSCOPIC
BILIRUBIN URINE: NEGATIVE
Glucose, UA: NEGATIVE
KETONES UR: NEGATIVE
NITRITE: NEGATIVE
PROTEIN: NEGATIVE
Specific Gravity, Urine: 1.021 (ref 1.001–1.035)
pH: 6 (ref 5.0–8.0)

## 2017-06-29 LAB — BASIC METABOLIC PANEL WITH GFR
BUN: 17 mg/dL (ref 7–25)
CO2: 21 mmol/L (ref 20–31)
CREATININE: 0.93 mg/dL (ref 0.50–1.05)
Calcium: 9.8 mg/dL (ref 8.6–10.4)
Chloride: 102 mmol/L (ref 98–110)
GFR, EST AFRICAN AMERICAN: 78 mL/min (ref >=60)
GFR, Est Non African American: 67 mL/min (ref >=60)
GLUCOSE: 94 mg/dL (ref 65–99)
POTASSIUM: 2.9 mmol/L — AB (ref 3.5–5.3)
Sodium: 142 mmol/L (ref 135–146)

## 2017-06-29 LAB — LIPID PANEL
Cholesterol: 183 mg/dL (ref <200)
HDL: 53 mg/dL (ref >50)
LDL CALC: 96 mg/dL (ref <100)
Total CHOL/HDL Ratio: 3.5 Ratio (ref <5.0)
Triglycerides: 168 mg/dL — ABNORMAL HIGH (ref <150)
VLDL: 34 mg/dL — ABNORMAL HIGH (ref <30)

## 2017-06-29 LAB — IRON AND TIBC
%SAT: 20 % (ref 11–50)
Iron: 62 ug/dL (ref 45–160)
TIBC: 316 ug/dL (ref 250–450)
UIBC: 254 ug/dL

## 2017-06-29 LAB — HIV ANTIBODY (ROUTINE TESTING W REFLEX): HIV: NONREACTIVE

## 2017-06-29 LAB — MAGNESIUM: MAGNESIUM: 1.9 mg/dL (ref 1.5–2.5)

## 2017-06-29 LAB — VITAMIN B12: VITAMIN B 12: 538 pg/mL (ref 200–1100)

## 2017-06-29 MED ORDER — POTASSIUM CHLORIDE ER 10 MEQ PO TBCR: 10.0000 meq | EXTENDED_RELEASE_TABLET | Freq: Every day | ORAL | 0 refills | Status: DC

## 2017-06-29 NOTE — Progress Notes (Signed)
LVM for pt to return office call for LAB results.

## 2017-07-02 LAB — CYTOLOGY - PAP
DIAGNOSIS: NEGATIVE
HPV: NOT DETECTED

## 2017-07-02 NOTE — Progress Notes (Signed)
LVM for pt to return office call for LAB results.

## 2017-07-03 NOTE — Progress Notes (Signed)
LVM for pt to return office call for LAB results.

## 2017-07-09 NOTE — Progress Notes (Signed)
LVM for pt to return office call for LAB results.

## 2017-07-15 ENCOUNTER — Other Ambulatory Visit: Payer: Self-pay | Admitting: Physician Assistant

## 2017-07-25 ENCOUNTER — Other Ambulatory Visit: Payer: Self-pay | Admitting: Physician Assistant

## 2017-07-25 DIAGNOSIS — N951 Menopausal and female climacteric states: Secondary | ICD-10-CM

## 2017-07-29 ENCOUNTER — Other Ambulatory Visit: Payer: Self-pay | Admitting: Physician Assistant

## 2017-08-24 ENCOUNTER — Ambulatory Visit: Payer: Managed Care, Other (non HMO) | Admitting: Podiatry

## 2017-08-31 ENCOUNTER — Other Ambulatory Visit: Payer: Self-pay

## 2017-08-31 MED ORDER — PRAVASTATIN SODIUM 40 MG PO TABS: ORAL_TABLET | ORAL | 0 refills | Status: DC

## 2017-09-06 ENCOUNTER — Ambulatory Visit (INDEPENDENT_AMBULATORY_CARE_PROVIDER_SITE_OTHER): Payer: Managed Care, Other (non HMO) | Admitting: Podiatry

## 2017-09-06 ENCOUNTER — Ambulatory Visit (INDEPENDENT_AMBULATORY_CARE_PROVIDER_SITE_OTHER): Payer: Managed Care, Other (non HMO)

## 2017-09-06 ENCOUNTER — Ambulatory Visit: Payer: Managed Care, Other (non HMO)

## 2017-09-06 ENCOUNTER — Other Ambulatory Visit: Payer: Self-pay | Admitting: Podiatry

## 2017-09-06 ENCOUNTER — Encounter: Payer: Self-pay | Admitting: Podiatry

## 2017-09-06 DIAGNOSIS — M79671 Pain in right foot: Secondary | ICD-10-CM

## 2017-09-06 DIAGNOSIS — M779 Enthesopathy, unspecified: Secondary | ICD-10-CM

## 2017-09-06 DIAGNOSIS — M79672 Pain in left foot: Secondary | ICD-10-CM

## 2017-09-06 MED ORDER — TRIAMCINOLONE ACETONIDE 10 MG/ML IJ SUSP
10.0000 mg | Freq: Once | INTRAMUSCULAR | Status: AC
  Administered 2017-09-06: 10 mg

## 2017-09-06 NOTE — Progress Notes (Signed)
Subjective:    Patient ID: Kaitlyn [name], female   DOB: 61 y.o.   MRN: [id number]   HPI patient states she's having a lot of pain in the inside of the right ankle and admits that she should've been in for orthotics but she has not gotten for them yet    ROS      Objective:  Physical Exam neurovascular status intact with collapse medial longitudinal arch bilateral with inflammation pain in the posterior tibial insertion right over left     Assessment:    Acute posterior tibial tendinitis with inflammation and foot structural changes creating excessive pressure on this     Plan:    H&P x-rays reviewed and careful sheath injection right administered 3 mg Kenalog 5 mill grams Xylocaine and fascial brace was dispensed. I then discussed long-term custom orthotics and I have recommended a more rigid type brace with the probability for some elevation of the arch and I will let the patient see Raiford Noble for evaluation and casting technique  X-rays indicate depression of the arch bilateral with no indications of arthritis or fracture

## 2017-09-27 ENCOUNTER — Other Ambulatory Visit: Payer: Managed Care, Other (non HMO) | Admitting: Orthotics

## 2017-10-02 ENCOUNTER — Other Ambulatory Visit: Payer: Managed Care, Other (non HMO) | Admitting: Orthotics

## 2017-10-15 ENCOUNTER — Encounter: Payer: Self-pay | Admitting: Physician Assistant

## 2017-10-15 ENCOUNTER — Ambulatory Visit: Payer: Managed Care, Other (non HMO) | Admitting: Physician Assistant

## 2017-10-15 ENCOUNTER — Ambulatory Visit: Payer: Self-pay | Admitting: Physician Assistant

## 2017-10-15 VITALS — BP 132/80 | HR 86 | Temp 97.3°F | Resp 14 | Ht 63.5 in | Wt 164.4 lb

## 2017-10-15 DIAGNOSIS — I10 Essential (primary) hypertension: Secondary | ICD-10-CM | POA: Diagnosis not present

## 2017-10-15 DIAGNOSIS — Z79899 Other long term (current) drug therapy: Secondary | ICD-10-CM

## 2017-10-15 DIAGNOSIS — E782 Mixed hyperlipidemia: Secondary | ICD-10-CM | POA: Diagnosis not present

## 2017-10-15 DIAGNOSIS — N760 Acute vaginitis: Secondary | ICD-10-CM | POA: Diagnosis not present

## 2017-10-15 DIAGNOSIS — R350 Frequency of micturition: Secondary | ICD-10-CM

## 2017-10-15 DIAGNOSIS — F3341 Major depressive disorder, recurrent, in partial remission: Secondary | ICD-10-CM | POA: Diagnosis not present

## 2017-10-15 MED ORDER — FLUCONAZOLE 150 MG PO TABS: 150.0000 mg | ORAL_TABLET | Freq: Once | ORAL | 1 refills | Status: AC

## 2017-10-15 MED ORDER — TERCONAZOLE 0.4 % VA CREA: 1.0000 | TOPICAL_CREAM | Freq: Every day | VAGINAL | 0 refills | Status: DC

## 2017-10-15 NOTE — Patient Instructions (Addendum)
[facility]  7 a.m.-6:30 p.m., Monday 7 a.m.-5 p.m., Tuesday-Friday Schedule an appointment by calling [phone].  Encourage you to get the 3D Mammogram  The 3D Mammogram is much more specific and sensitive to pick up breast cancer. For women with fibrocystic breast or lumpy breast it can be hard to determine if it is cancer or not but the 3D mammogram is able to tell this difference which cuts back on unneeded additional tests or scary call backs.   - over 40% increase in detection of breast cancer - over 40% reduction in false positives.  - fewer call backs - reduced anxiety - improved outcomes - [employer] [employer] M[employer]    Vaginitis Vaginitis is a condition in which the vaginal tissue swells and becomes red (inflamed). This condition is most often caused by a change in the normal balance of bacteria and yeast that live in the vagina. This change causes an overgrowth of certain bacteria or yeast, which causes the inflammation. There are different types of vaginitis, but the most common types are:  Bacterial vaginosis.  Yeast infection (candidiasis).  Trichomoniasis vaginitis. This is a sexually transmitted disease (STD).  Viral vaginitis.  Atrophic vaginitis.  Allergic vaginitis.  What are the causes? The cause of this condition depends on the type of vaginitis. It can be caused by:  Bacteria (bacterial vaginosis).  Yeast, which is a fungus (yeast infection).  A parasite (trichomoniasis vaginitis).  A virus (viral vaginitis).  Low hormone levels (atrophic vaginitis). Low hormone levels can occur during pregnancy, breastfeeding, or after menopause.  Irritants, such as bubble baths, scented tampons, and feminine sprays (allergic vaginitis).  Other factors can change the normal balance of the yeast and bacteria that live in the vagina. These include:  Antibiotic medicines.  Poor hygiene.  Diaphragms, vaginal sponges, spermicides, birth  control pills, and intrauterine devices (IUD).  Sex.  Infection.  Uncontrolled diabetes.  A weakened defense (immune) system.  What increases the risk? This condition is more likely to develop in women who:  Smoke.  Use vaginal douches, scented tampons, or scented sanitary pads.  Wear tight-fitting pants.  Wear thong underwear.  Use oral birth control pills or an IUD.  Have sex without a condom.  Have multiple sex partners.  Have an STD.  Frequently use the spermicide nonoxynol-9.  Eat lots of foods high in sugar.  Have uncontrolled diabetes.  Have low estrogen levels.  Have a weakened immune system from an immune disorder or medical treatment.  Are pregnant or breastfeeding.  What are the signs or symptoms? Symptoms vary depending on the cause of the vaginitis. Common symptoms include:  Abnormal vaginal discharge. ? The discharge is white, gray, or yellow with bacterial vaginosis. ? The discharge is thick, white, and cheesy with a yeast infection. ? The discharge is frothy and yellow or greenish with trichomoniasis.  A bad vaginal smell. The smell is fishy with bacterial vaginosis.  Vaginal itching, pain, or swelling.  Sex that is painful.  Pain or burning when urinating.  Sometimes there are no symptoms. How is this diagnosed? This condition is diagnosed based on your symptoms and medical history. A physical exam, including a pelvic exam, will also be done. You may also have other tests, including:  Tests to determine the pH level (acidity or alkalinity) of your vagina.  A whiff test, to assess the odor that results when a sample of your vaginal discharge is mixed with a potassium hydroxide solution.  Tests  of vaginal fluid. A sample will be examined under a microscope.  How is this treated? Treatment varies depending on the type of vaginitis you have. Your treatment may include:  Antibiotic creams or pills to treat bacterial vaginosis and  trichomoniasis.  Antifungal medicines, such as vaginal creams or suppositories, to treat a yeast infection.  Medicine to ease discomfort if you have viral vaginitis. Your sexual partner should also be treated.  Estrogen delivered in a cream, pill, suppository, or vaginal ring to treat atrophic vaginitis. If vaginal dryness occurs, lubricants and moisturizing creams may help. You may need to avoid scented soaps, sprays, or douches.  Stopping use of a product that is causing allergic vaginitis. Then using a vaginal cream to treat the symptoms.  Follow these instructions at home: Lifestyle  Keep your genital area clean and dry. Avoid soap, and only rinse the area with water.  Do not douche or use tampons until your health care provider says it is okay to do so. Use sanitary pads, if needed.  Do not have sex until your health care provider approves. When you can return to sex, practice safe sex and use condoms.  Wipe from front to back. This avoids the spread of bacteria from the rectum to the vagina. General instructions  Take over-the-counter and prescription medicines only as told by your health care provider.  If you were prescribed an antibiotic medicine, take or use it as told by your health care provider. Do not stop taking or using the antibiotic even if you start to feel better.  Keep all follow-up visits as told by your health care provider. This is important. How is this prevented?  Use mild, non-scented products. Do not use things that can irritate the vagina, such as fabric softeners. Avoid the following products if they are scented: ? Feminine sprays. ? Detergents. ? Tampons. ? Feminine hygiene products. ? Soaps or bubble baths.  Let air reach your genital area. ? Wear cotton underwear to reduce moisture buildup. ? Avoid wearing underwear while you sleep. ? Avoid wearing tight pants and underwear or nylons without a cotton panel. ? Avoid wearing thong  underwear.  Take off any wet clothing, such as bathing suits, as soon as possible.  Practice safe sex and use condoms. Contact a health care provider if:  You have abdominal pain.  You have a fever.  You have symptoms that last for more than 2-3 days. Get help right away if:  You have a fever and your symptoms suddenly get worse. Summary  Vaginitis is a condition in which the vaginal tissue becomes inflamed.This condition is most often caused by a change in the normal balance of bacteria and yeast that live in the vagina.  Treatment varies depending on the type of vaginitis you have.  Do not douche, use tampons , or have sex until your health care provider approves. When you can return to sex, practice safe sex and use condoms. This information is not intended to replace advice given to you by your health care provider. Make sure you discuss any questions you have with your health care provider. Document Released: 09/24/2007 Document Revised: 01/02/2017 Document Reviewed: 01/02/2017 Elsevier Interactive Patient Education  [employer]2018 [employer]

## 2017-10-15 NOTE — Progress Notes (Signed)
Assessment and Plan:   Essential hypertension - continue medications, DASH diet, exercise and monitor at home. Call if greater than 130/80.  -     CBC with Differential/Platelet -     BASIC METABOLIC PANEL WITH GFR -     Hepatic function panel -     TSH  Mixed hyperlipidemia -continue medications, check lipids, decrease fatty foods, increase activity.  -     Lipid panel  Medication management -     Magnesium  Recurrent major depressive disorder, in partial remission (HCC) Continue meds  Acute vaginitis -     WET PREP BY MOLECULAR PROBE -     terconazole (TERAZOL 7) 0.4 % vaginal cream; Place 1 applicator at bedtime vaginally. -     fluconazole (DIFLUCAN) 150 MG tablet; Take 1 tablet (150 mg total) once for 1 dose by mouth.  Urinary frequency -     Urinalysis, Routine w reflex microscopic -     Urine Culture  Continue diet and meds as discussed. Further disposition pending results of labs. Discussed med's effects and SE's.   Future Appointments  Date Time Provider Department Center  07/01/2018  9:00 AM Quentin Mulling, PA-C [facility] None     HPI 60 y.o. AA female  presents for 3 month follow up with hypertension, hyperlipidemia, diabetes and vitamin D deficiency.  She has had vaginal burning after taking shower x 1 week, no discharge, no dyruria, some urinary frequency/incontience. Denies vaginal dryness, using vaseline that helps. No change in soaps.    Her blood pressure has been controlled at home, today their BP is BP: 132/80.She does not workout. She denies chest pain, shortness of breath, dizziness.     She is on cholesterol medication and denies myalgias. Her cholesterol is at goal. The cholesterol was:  06/28/2017: Cholesterol 183; HDL 53; LDL Cholesterol 96; Triglycerides 168   She has been working on diet and exercise for prediabetes without complications, and denies  foot ulcerations, hyperglycemia, hypoglycemia , increased appetite, nausea, paresthesia of the  feet, polydipsia, polyuria, visual disturbances, vomiting and weight loss. Last A1C was: 06/28/2017: Hgb A1c MFr Bld 5.7   Patient is on Vitamin D supplement. 03/26/2017: Vit D, 25-Hydroxy 45  She is on lexapro for depression and hot flashes as well as the gabapentin.   BMI is Body mass index is 28.67 kg/m., she is working on diet and exercise. Wt Readings from Last 3 Encounters:  10/15/17 164 lb 6.4 oz (74.6 kg)  06/28/17 160 lb (72.6 kg)  03/26/17 167 lb (75.8 kg)     Current Medications:  Current Outpatient Medications on File Prior to Visit  Medication Sig Dispense Refill  . albuterol (VENTOLIN HFA) 108 (90 Base) MCG/ACT inhaler Inhale 2 puffs into the lungs every 4 (four) hours as needed for wheezing or shortness of breath. 1 Inhaler 0  . amLODipine (NORVASC) 5 MG tablet TAKE ONE TABLET BY MOUTH DAILY 90 tablet 0  . aspirin 81 MG tablet Take 81 mg by mouth daily.    Marland Kitchen azelastine (ASTELIN) 0.1 % nasal spray PLACE 2 SPRAYS INTO BOTH NOSTRILS 2 (TWO) TIMES DAILY. USE IN EACH NOSTRIL AS DIRECTED 30 mL 1  . Biotin 300 MCG TABS Take 1 tablet by mouth at bedtime.     . budesonide-formoterol (SYMBICORT) 80-4.5 MCG/ACT inhaler Inhale 2 puffs into the lungs 2 (two) times daily. 2 Inhaler 0  . Cholecalciferol (VITAMIN D3) 2000 UNITS TABS Take 2 tablets by mouth at bedtime.     Marland Kitchen  cloNIDine (CATAPRES) 0.1 MG tablet TAKE ONE TABLET BY MOUTH DAILY 90 tablet 1  . escitalopram (LEXAPRO) 20 MG tablet Take 1 tablet (20 mg total) by mouth daily. 90 tablet 2  . fluticasone (FLONASE) 50 MCG/ACT nasal spray SPRAY TWO SPRAYS IN EACH NOSTRIL ONCE DAILY 16 g 0  . gabapentin (NEURONTIN) 100 MG capsule TAKE 1 TO 3 CAPSULES BY MOUTH AT NIGHT FOR HOT FLASHES 270 capsule 0  . hydrochlorothiazide (HYDRODIURIL) 25 MG tablet TAKE 1 TABLET (25 MG TOTAL) BY MOUTH DAILY. 90 tablet 0  . latanoprost (XALATAN) 0.005 % ophthalmic solution Place 1 drop into both eyes at bedtime.    . Magnesium 250 MG TABS Take 1 tablet by  mouth 2 (two) times daily.     . meloxicam (MOBIC) 15 MG tablet Take one daily with food for 2 weeks, can take with tylenol, can not take with aleve, iburpofen, then as needed daily for pain 30 tablet 1  . Omega-3 Fatty Acids (OMEGA 3 PO) Take 1 capsule by mouth 2 (two) times daily.     . potassium chloride (K-DUR) 10 MEQ tablet TAKE ONE TABLET BY MOUTH DAILY 90 tablet 0  . pravastatin (PRAVACHOL) 40 MG tablet TAKE 1 TABLET (40 MG TOTAL) BY MOUTH DAILY. 90 tablet 0  . promethazine-dextromethorphan (PROMETHAZINE-DM) 6.25-15 MG/5ML syrup Take 1 to 2 teaspoons 4 x/ day if needed for cough 240 mL 2  . ranitidine (ZANTAC) 300 MG tablet Take 1 tablet (300 mg total) by mouth at bedtime. 90 tablet 2   No current facility-administered medications on file prior to visit.    Medical History:  Past Medical History:  Diagnosis Date  . Allergy   . Anxiety   . Depression   . Esophageal stricture   . GERD (gastroesophageal reflux disease)   . Hyperlipemia   . Hypertension   . Unspecified vitamin D deficiency    Allergies:  Allergies  Allergen Reactions  . 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 Clinoril [Sulindac] Rash     Review of Systems:  Review of Systems  Constitutional: Negative for chills, fever and malaise/fatigue.  HENT: Negative for congestion, ear pain and sore throat.   Eyes: Negative.   Respiratory: Negative for cough, shortness of breath and wheezing.   Cardiovascular: Negative for chest pain, palpitations and leg swelling.  Gastrointestinal: Negative for abdominal pain, blood in stool, constipation, diarrhea, heartburn and melena.  Genitourinary: Negative.   Skin: Negative.   Neurological: Negative for dizziness, sensory change, loss of consciousness and headaches.  Psychiatric/Behavioral: Negative for depression. The patient is not nervous/anxious and does not have insomnia.     Family history- Review and unchanged  Social history-  Review and unchanged  Physical Exam: BP 132/80   Pulse 86   Temp (!) 97.3 F (36.3 C)   Resp 14   Ht 5' 3.5" (1.613 m)   Wt 164 lb 6.4 oz (74.6 kg)   SpO2 97%   BMI 28.67 kg/m  Wt Readings from Last 3 Encounters:  10/15/17 164 lb 6.4 oz (74.6 kg)  06/28/17 160 lb (72.6 kg)  03/26/17 167 lb (75.8 kg)   General Appearance: Well nourished well developed, non-toxic appearing, in no apparent distress. Eyes: PERRLA, EOMs, conjunctiva no swelling or erythema ENT/Mouth: Ear canals clear with no erythema, swelling, or discharge.  TMs normal bilaterally, oropharynx clear, moist, with no exudate.   Neck: Supple, thyroid normal, no JVD, no cervical  adenopathy.  Respiratory: Respiratory effort normal, breath sounds clear A&P, no wheeze, rhonchi or rales noted.  No retractions, no accessory muscle usage Cardio: RRR with no MRGs. No noted edema.  Abdomen: Soft, + BS.  Non tender, no guarding, rebound, hernias, masses. Musculoskeletal: Full ROM, 5/5 strength, Normal gait Skin: Warm, dry without rashes, lesions, ecchymosis.  GU:  Normal external female genitalia.  Atrophic vaginal changes secondary to age. Thin white discharge. Ovaries not palpable.  No CMT.  NO palpable uterine masses Neuro: Awake and oriented X 3, Cranial nerves intact. No cerebellar symptoms.  Psych: normal affect, Insight and Judgment appropriate.    Quentin Mulling, PA-C 9:09 AM [facility]

## 2017-10-16 ENCOUNTER — Other Ambulatory Visit: Payer: Self-pay | Admitting: Physician Assistant

## 2017-10-16 LAB — HEPATIC FUNCTION PANEL
AG Ratio: 1.8 (calc) (ref 1.0–2.5)
ALT: 21 U/L (ref 6–29)
AST: 17 U/L (ref 10–35)
Albumin: 4.4 g/dL (ref 3.6–5.1)
Alkaline phosphatase (APISO): 89 U/L (ref 33–130)
BILIRUBIN DIRECT: 0 mg/dL (ref 0.0–0.2)
BILIRUBIN INDIRECT: 0.3 mg/dL (ref 0.2–1.2)
GLOBULIN: 2.5 g/dL (ref 1.9–3.7)
Total Bilirubin: 0.3 mg/dL (ref 0.2–1.2)
Total Protein: 6.9 g/dL (ref 6.1–8.1)

## 2017-10-16 LAB — URINALYSIS, ROUTINE W REFLEX MICROSCOPIC
BILIRUBIN URINE: NEGATIVE
GLUCOSE, UA: NEGATIVE
HYALINE CAST: NONE SEEN /LPF
Ketones, ur: NEGATIVE
NITRITE: NEGATIVE
SPECIFIC GRAVITY, URINE: 1.026 (ref 1.001–1.03)
Squamous Epithelial / LPF: NONE SEEN /HPF (ref <=5)
WBC, UA: >=60 /HPF — AB (ref 0–5)

## 2017-10-16 LAB — CBC WITH DIFFERENTIAL/PLATELET
BASOS ABS: 20 {cells}/uL (ref 0–200)
Basophils Relative: 0.4 %
EOS ABS: 142 {cells}/uL (ref 15–500)
Eosinophils Relative: 2.9 %
HCT: 42.6 % (ref 35.0–45.0)
Hemoglobin: 14.6 g/dL (ref 11.7–15.5)
Lymphs Abs: 1985 cells/uL (ref 850–3900)
MCH: 31.9 pg (ref 27.0–33.0)
MCHC: 34.3 g/dL (ref 32.0–36.0)
MCV: 93.2 fL (ref 80.0–100.0)
MPV: 10 fL (ref 7.5–12.5)
Monocytes Relative: 7 %
NEUTROS PCT: 49.2 %
Neutro Abs: 2411 cells/uL (ref 1500–7800)
PLATELETS: 201 10*3/uL (ref 140–400)
RBC: 4.57 10*6/uL (ref 3.80–5.10)
RDW: 13 % (ref 11.0–15.0)
TOTAL LYMPHOCYTE: 40.5 %
WBC: 4.9 10*3/uL (ref 3.8–10.8)
WBCMIX: 343 {cells}/uL (ref 200–950)

## 2017-10-16 LAB — BASIC METABOLIC PANEL WITH GFR
BUN / CREAT RATIO: 15 (calc) (ref 6–22)
BUN: 16 mg/dL (ref 7–25)
CHLORIDE: 106 mmol/L (ref 98–110)
CO2: 31 mmol/L (ref 20–32)
Calcium: 9.8 mg/dL (ref 8.6–10.4)
Creat: 1.08 mg/dL — ABNORMAL HIGH (ref 0.50–1.05)
GFR, EST AFRICAN AMERICAN: 65 mL/min/{1.73_m2} (ref >=60)
GFR, Est Non African American: 56 mL/min/{1.73_m2} — ABNORMAL LOW (ref >=60)
Glucose, Bld: 120 mg/dL — ABNORMAL HIGH (ref 65–99)
POTASSIUM: 3.8 mmol/L (ref 3.5–5.3)
SODIUM: 144 mmol/L (ref 135–146)

## 2017-10-16 LAB — WET PREP BY MOLECULAR PROBE
CANDIDA SPECIES: DETECTED — AB
MICRO NUMBER: 81240755
SPECIMEN QUALITY:: ADEQUATE
TRICHOMONAS VAG: NOT DETECTED

## 2017-10-16 LAB — LIPID PANEL
CHOL/HDL RATIO: 3.3 (calc) (ref <5.0)
Cholesterol: 191 mg/dL (ref <200)
HDL: 58 mg/dL (ref >50)
LDL CHOLESTEROL (CALC): 104 mg/dL — AB
Non-HDL Cholesterol (Calc): 133 mg/dL (calc) — ABNORMAL HIGH (ref <130)
Triglycerides: 169 mg/dL — ABNORMAL HIGH (ref <150)

## 2017-10-16 LAB — TSH: TSH: 1.94 m[IU]/L (ref 0.40–4.50)

## 2017-10-16 LAB — MAGNESIUM: MAGNESIUM: 1.9 mg/dL (ref 1.5–2.5)

## 2017-10-16 MED ORDER — METRONIDAZOLE 500 MG PO TABS: 500.0000 mg | ORAL_TABLET | Freq: Two times a day (BID) | ORAL | 0 refills | Status: AC

## 2017-10-16 NOTE — Progress Notes (Signed)
Pt aware of lab results & voiced understanding of those results.

## 2017-10-18 ENCOUNTER — Other Ambulatory Visit: Payer: Self-pay | Admitting: Physician Assistant

## 2017-10-18 DIAGNOSIS — N3000 Acute cystitis without hematuria: Secondary | ICD-10-CM

## 2017-10-18 LAB — URINE CULTURE
MICRO NUMBER: 81240726
SPECIMEN QUALITY:: ADEQUATE

## 2017-10-18 MED ORDER — CEFUROXIME AXETIL 250 MG PO TABS: 250.0000 mg | ORAL_TABLET | Freq: Two times a day (BID) | ORAL | 0 refills | Status: DC

## 2017-10-23 ENCOUNTER — Ambulatory Visit: Payer: Self-pay | Admitting: Physician Assistant

## 2017-10-23 ENCOUNTER — Other Ambulatory Visit: Payer: Self-pay | Admitting: Internal Medicine

## 2017-10-23 DIAGNOSIS — N951 Menopausal and female climacteric states: Secondary | ICD-10-CM

## 2017-10-29 ENCOUNTER — Other Ambulatory Visit: Payer: Self-pay

## 2017-10-29 MED ORDER — ESCITALOPRAM OXALATE 20 MG PO TABS: 20.0000 mg | ORAL_TABLET | Freq: Every day | ORAL | 0 refills | Status: DC

## 2017-10-29 NOTE — Progress Notes (Signed)
LVM for pt to return office call for LAB results.  Results have been mailed out to pt due to no return call. Message sent to front office to schedule: follow up 1 month lab only, lab in epic.

## 2017-11-15 ENCOUNTER — Other Ambulatory Visit: Payer: Self-pay | Admitting: Internal Medicine

## 2017-11-19 ENCOUNTER — Other Ambulatory Visit: Payer: Self-pay | Admitting: Physician Assistant

## 2017-11-19 DIAGNOSIS — N951 Menopausal and female climacteric states: Secondary | ICD-10-CM

## 2017-12-08 ENCOUNTER — Other Ambulatory Visit: Payer: Self-pay | Admitting: Physician Assistant

## 2017-12-12 ENCOUNTER — Other Ambulatory Visit: Payer: Self-pay | Admitting: Internal Medicine

## 2017-12-20 ENCOUNTER — Other Ambulatory Visit: Payer: Self-pay | Admitting: Physician Assistant

## 2018-01-12 ENCOUNTER — Other Ambulatory Visit: Payer: Self-pay | Admitting: Internal Medicine

## 2018-01-30 ENCOUNTER — Other Ambulatory Visit: Payer: Self-pay | Admitting: Internal Medicine

## 2018-01-30 DIAGNOSIS — N951 Menopausal and female climacteric states: Secondary | ICD-10-CM

## 2018-02-14 ENCOUNTER — Ambulatory Visit: Payer: Self-pay | Admitting: Physician Assistant

## 2018-02-26 ENCOUNTER — Other Ambulatory Visit: Payer: Self-pay | Admitting: Internal Medicine

## 2018-02-26 DIAGNOSIS — N951 Menopausal and female climacteric states: Secondary | ICD-10-CM

## 2018-03-05 NOTE — Progress Notes (Deleted)
Assessment and Plan:   Essential hypertension - continue medications, DASH diet, exercise and monitor at home. Call if greater than 130/80.  -     CBC with Differential/Platelet -     BASIC METABOLIC PANEL WITH GFR -     Hepatic function panel -     TSH  Mixed hyperlipidemia -continue medications, check lipids, decrease fatty foods, increase activity.  -     Lipid panel  Medication management -     Magnesium  Recurrent major depressive disorder, in partial remission (HCC) Continue meds  Acute vaginitis -     WET PREP BY MOLECULAR PROBE -     terconazole (TERAZOL 7) 0.4 % vaginal cream; Place 1 applicator at bedtime vaginally. -     fluconazole (DIFLUCAN) 150 MG tablet; Take 1 tablet (150 mg total) once for 1 dose by mouth.  Urinary frequency -     Urinalysis, Routine w reflex microscopic -     Urine Culture  Continue diet and meds as discussed. Further disposition pending results of labs. Discussed med's effects and SE's.   Future Appointments  Date Time Provider Department Center  03/06/2018 10:45 AM Quentin Mulling, PA-C [facility] None  07/01/2018  9:00 AM Quentin Mulling, PA-C [facility] None     HPI 62 y.o. AA female  presents for 3 month follow up with hypertension, hyperlipidemia, diabetes and vitamin D deficiency.  She has had vaginal burning after taking shower x 1 week, no discharge, no dyruria, some urinary frequency/incontience. Denies vaginal dryness, using vaseline that helps. No change in soaps.    Her blood pressure has been controlled at home, today their BP is  .She does not workout. She denies chest pain, shortness of breath, dizziness.     She is on cholesterol medication and denies myalgias. Her cholesterol is at goal. The cholesterol was:  10/15/2017: Cholesterol 191; HDL 58; LDL Cholesterol (Calc) 104; Triglycerides 169   She has been working on diet and exercise for prediabetes without complications, and denies  foot ulcerations, hyperglycemia,  hypoglycemia , increased appetite, nausea, paresthesia of the feet, polydipsia, polyuria, visual disturbances, vomiting and weight loss. Last A1C was: 06/28/2017: Hgb A1c MFr Bld 5.7   Patient is on Vitamin D supplement. 03/26/2017: Vit D, 25-Hydroxy 45  She is on lexapro for depression and hot flashes as well as the gabapentin.   BMI is There is no height or weight on file to calculate BMI., she is working on diet and exercise. Wt Readings from Last 3 Encounters:  10/15/17 164 lb 6.4 oz (74.6 kg)  06/28/17 160 lb (72.6 kg)  03/26/17 167 lb (75.8 kg)     Current Medications:  Current Outpatient Medications on File Prior to Visit  Medication Sig Dispense Refill  . albuterol (VENTOLIN HFA) 108 (90 Base) MCG/ACT inhaler Inhale 2 puffs into the lungs every 4 (four) hours as needed for wheezing or shortness of breath. 1 Inhaler 0  . amLODipine (NORVASC) 5 MG tablet TAKE ONE TABLET BY MOUTH DAILY 90 tablet 0  . aspirin 81 MG tablet Take 81 mg by mouth daily.    Marland Kitchen azelastine (ASTELIN) 0.1 % nasal spray PLACE 2 SPRAYS INTO BOTH NOSTRILS 2 (TWO) TIMES DAILY. USE IN EACH NOSTRIL AS DIRECTED 30 mL 1  . Biotin 300 MCG TABS Take 1 tablet by mouth at bedtime.     . budesonide-formoterol (SYMBICORT) 80-4.5 MCG/ACT inhaler Inhale 2 puffs into the lungs 2 (two) times daily. 2 Inhaler 0  . cefUROXime (CEFTIN)  250 MG tablet Take 1 tablet (250 mg total) 2 (two) times daily by mouth. 20 tablet 0  . Cholecalciferol (VITAMIN D3) 2000 UNITS TABS Take 2 tablets by mouth at bedtime.     . cloNIDine (CATAPRES) 0.1 MG tablet TAKE ONE TABLET BY MOUTH DAILY 90 tablet 1  . escitalopram (LEXAPRO) 20 MG tablet Take 1 tablet (20 mg total) daily by mouth. 90 tablet 0  . fluticasone (FLONASE) 50 MCG/ACT nasal spray SPRAY TWO SPRAYS IN EACH NOSTRIL ONCE DAILY 16 g 0  . gabapentin (NEURONTIN) 100 MG capsule TAKE 1-3 CAPSULES BY MOUTH EVERY NIGHT AT BEDTIME FOR HOT FLASHES 90 capsule 1  . hydrochlorothiazide (HYDRODIURIL) 25  MG tablet TAKE 1 TABLET (25 MG TOTAL) BY MOUTH DAILY. 90 tablet 0  . latanoprost (XALATAN) 0.005 % ophthalmic solution Place 1 drop into both eyes at bedtime.    . Magnesium 250 MG TABS Take 1 tablet by mouth 2 (two) times daily.     . meloxicam (MOBIC) 15 MG tablet Take one daily with food for 2 weeks, can take with tylenol, can not take with aleve, iburpofen, then as needed daily for pain 30 tablet 1  . Omega-3 Fatty Acids (OMEGA 3 PO) Take 1 capsule by mouth 2 (two) times daily.     . potassium chloride (K-DUR) 10 MEQ tablet TAKE ONE TABLET BY MOUTH DAILY 90 tablet 1  . pravastatin (PRAVACHOL) 40 MG tablet TAKE ONE TABLET BY MOUTH DAILY 90 tablet 0  . ranitidine (ZANTAC) 300 MG tablet TAKE ONE TABLET BY MOUTH EVERY NIGHT AT BEDTIME 90 tablet 1  . terconazole (TERAZOL 7) 0.4 % vaginal cream Place 1 applicator at bedtime vaginally. 45 g 0   No current facility-administered medications on file prior to visit.    Medical History:  Past Medical History:  Diagnosis Date  . Allergy   . Anxiety   . Depression   . Esophageal stricture   . GERD (gastroesophageal reflux disease)   . Hyperlipemia   . Hypertension   . Unspecified vitamin D deficiency    Allergies:  Allergies  Allergen Reactions  . 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 Clinoril [Sulindac] Rash     Review of Systems:  Review of Systems  Constitutional: Negative for chills, fever and malaise/fatigue.  HENT: Negative for congestion, ear pain and sore throat.   Eyes: Negative.   Respiratory: Negative for cough, shortness of breath and wheezing.   Cardiovascular: Negative for chest pain, palpitations and leg swelling.  Gastrointestinal: Negative for abdominal pain, blood in stool, constipation, diarrhea, heartburn and melena.  Genitourinary: Negative.   Skin: Negative.   Neurological: Negative for dizziness, sensory change, loss of consciousness and headaches.   Psychiatric/Behavioral: Negative for depression. The patient is not nervous/anxious and does not have insomnia.     Family history- Review and unchanged  Social history- Review and unchanged  Physical Exam: There were no vitals taken for this visit. Wt Readings from Last 3 Encounters:  10/15/17 164 lb 6.4 oz (74.6 kg)  06/28/17 160 lb (72.6 kg)  03/26/17 167 lb (75.8 kg)   General Appearance: Well nourished well developed, non-toxic appearing, in no apparent distress. Eyes: PERRLA, EOMs, conjunctiva no swelling or erythema ENT/Mouth: Ear canals clear with no erythema, swelling, or discharge.  TMs normal bilaterally, oropharynx clear, moist, with no exudate.   Neck: Supple, thyroid normal, no JVD, no cervical adenopathy.  Respiratory: Respiratory effort normal,  breath sounds clear A&P, no wheeze, rhonchi or rales noted.  No retractions, no accessory muscle usage Cardio: RRR with no MRGs. No noted edema.  Abdomen: Soft, + BS.  Non tender, no guarding, rebound, hernias, masses. Musculoskeletal: Full ROM, 5/5 strength, Normal gait Skin: Warm, dry without rashes, lesions, ecchymosis.  GU:  Normal external female genitalia.  Atrophic vaginal changes secondary to age. Thin white discharge. Ovaries not palpable.  No CMT.  NO palpable uterine masses Neuro: Awake and oriented X 3, Cranial nerves intact. No cerebellar symptoms.  Psych: normal affect, Insight and Judgment appropriate.    Quentin Mulling, PA-C 7:42 AM [facility]

## 2018-03-06 ENCOUNTER — Ambulatory Visit: Payer: Self-pay | Admitting: Physician Assistant

## 2018-03-08 ENCOUNTER — Other Ambulatory Visit: Payer: Self-pay | Admitting: Internal Medicine

## 2018-04-02 ENCOUNTER — Other Ambulatory Visit: Payer: Self-pay | Admitting: Physician Assistant

## 2018-04-10 NOTE — Progress Notes (Signed)
Assessment and Plan:   Essential hypertension - continue medications, DASH diet, exercise and monitor at home. Call if greater than 130/80.  -     CBC with Differential/Platelet -     COMPLETE METABOLIC PANEL WITH GFR -     TSH  Mixed hyperlipidemia -continue medications, check lipids, decrease fatty foods, increase activity.  -     Lipid panel  Medication management  Cough Likely allergies, if not better get CXR No CP Go to the ER if any chest pain, shortness of breath, nausea, dizziness, severe HA, changes vision/speech -     DG Chest 2 View; Future -     fluticasone (FLONASE) 50 MCG/ACT nasal spray; SPRAY TWO SPRAYS IN EACH NOSTRIL ONCE DAILY -     promethazine-dextromethorphan (PROMETHAZINE-DM) 6.25-15 MG/5ML syrup; Take 5 mLs by mouth 4 (four) times daily as needed for cough.  Abnormal glucose -     Hemoglobin A1c  Continue diet and meds as discussed. Further disposition pending results of labs. Discussed med's effects and SE's.   Future Appointments  Date Time Provider Department Center  07/12/2018  9:30 AM Judd Gaudier, NP [facility] None     HPI 62 y.o. AA female  presents for 3 month follow up with hypertension, hyperlipidemia, diabetes and vitamin D deficiency.  She has had cough x 1 month, no fever, chills, she has had some wheezing but no SOB, CP. CXR 2014. Former smoker. She has not been on her symbicort or albuterol inhaler.    Her blood pressure has been controlled at home, today their BP is BP: 128/66.She does not workout. She denies chest pain, shortness of breath, dizziness.     She is on cholesterol medication and denies myalgias. Her cholesterol is at goal. The cholesterol was:  10/15/2017: Cholesterol 191; HDL 58; LDL Cholesterol (Calc) 104; Triglycerides 169   She has been working on diet and exercise for prediabetes without complications, and denies  foot ulcerations, hyperglycemia, hypoglycemia , increased appetite, nausea, paresthesia of the feet,  polydipsia, polyuria, visual disturbances, vomiting and weight loss. Last A1C was: 06/28/2017: Hgb A1c MFr Bld 5.7   Patient is on Vitamin D supplement.  She is on lexapro for depression and hot flashes as well as the gabapentin.   BMI is Body mass index is 29.12 kg/m., she is working on diet and exercise. Wt Readings from Last 3 Encounters:  04/11/18 167 lb (75.8 kg)  10/15/17 164 lb 6.4 oz (74.6 kg)  06/28/17 160 lb (72.6 kg)     Current Medications:  Current Outpatient Medications on File Prior to Visit  Medication Sig Dispense Refill  . albuterol (VENTOLIN HFA) 108 (90 Base) MCG/ACT inhaler Inhale 2 puffs into the lungs every 4 (four) hours as needed for wheezing or shortness of breath. 1 Inhaler 0  . amLODipine (NORVASC) 5 MG tablet TAKE ONE TABLET BY MOUTH DAILY 90 tablet 0  . aspirin 81 MG tablet Take 81 mg by mouth daily.    Marland Kitchen azelastine (ASTELIN) 0.1 % nasal spray PLACE 2 SPRAYS INTO BOTH NOSTRILS 2 (TWO) TIMES DAILY. USE IN EACH NOSTRIL AS DIRECTED 30 mL 1  . Biotin 300 MCG TABS Take 1 tablet by mouth at bedtime.     . budesonide-formoterol (SYMBICORT) 80-4.5 MCG/ACT inhaler Inhale 2 puffs into the lungs 2 (two) times daily. 2 Inhaler 0  . Cholecalciferol (VITAMIN D3) 2000 UNITS TABS Take 2 tablets by mouth at bedtime.     . cloNIDine (CATAPRES) 0.1 MG tablet TAKE ONE  TABLET BY MOUTH DAILY 90 tablet 1  . escitalopram (LEXAPRO) 20 MG tablet Take 1 tablet (20 mg total) daily by mouth. 90 tablet 0  . fluticasone (FLONASE) 50 MCG/ACT nasal spray SPRAY TWO SPRAYS IN EACH NOSTRIL ONCE DAILY 16 g 0  . gabapentin (NEURONTIN) 100 MG capsule TAKE 1-3 CAPSULES BY MOUTH EVERY NIGHT AT BEDTIME FOR HOT FLASHES 90 capsule 1  . hydrochlorothiazide (HYDRODIURIL) 25 MG tablet TAKE ONE TABLET BY MOUTH DAILY 90 tablet 0  . latanoprost (XALATAN) 0.005 % ophthalmic solution Place 1 drop into both eyes at bedtime.    . Magnesium 250 MG TABS Take 1 tablet by mouth 2 (two) times daily.     .  meloxicam (MOBIC) 15 MG tablet Take one daily with food for 2 weeks, can take with tylenol, can not take with aleve, iburpofen, then as needed daily for pain 30 tablet 1  . Omega-3 Fatty Acids (OMEGA 3 PO) Take 1 capsule by mouth 2 (two) times daily.     . potassium chloride (K-DUR) 10 MEQ tablet TAKE ONE TABLET BY MOUTH DAILY 90 tablet 1  . pravastatin (PRAVACHOL) 40 MG tablet TAKE ONE TABLET BY MOUTH DAILY 90 tablet 0  . ranitidine (ZANTAC) 300 MG tablet TAKE ONE TABLET BY MOUTH EVERY NIGHT AT BEDTIME 90 tablet 1  . terconazole (TERAZOL 7) 0.4 % vaginal cream Place 1 applicator at bedtime vaginally. 45 g 0   No current facility-administered medications on file prior to visit.    Medical History:  Past Medical History:  Diagnosis Date  . Allergy   . Anxiety   . Depression   . Esophageal stricture   . GERD (gastroesophageal reflux disease)   . Hyperlipemia   . Hypertension   . Unspecified vitamin D deficiency    Allergies:  Allergies  Allergen Reactions  . 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 Clinoril [Sulindac] Rash     Review of Systems:  Review of Systems  Constitutional: Negative for chills, fever and malaise/fatigue.  HENT: Positive for congestion. Negative for ear pain and sore throat.   Eyes: Negative.   Respiratory: Positive for cough. Negative for hemoptysis, sputum production, shortness of breath and wheezing.   Cardiovascular: Negative for chest pain, palpitations and leg swelling.  Gastrointestinal: Negative for abdominal pain, blood in stool, constipation, diarrhea, heartburn and melena.  Genitourinary: Negative.   Skin: Negative.   Neurological: Negative for dizziness, sensory change, loss of consciousness and headaches.  Psychiatric/Behavioral: Negative for depression. The patient is not nervous/anxious and does not have insomnia.     Family history- Review and unchanged  Social history- Review and  unchanged  Physical Exam: BP 128/66   Pulse 98   Temp 97.7 F (36.5 C)   Resp 16   Ht 5' 3.5" (1.613 m)   Wt 167 lb (75.8 kg)   SpO2 98%   BMI 29.12 kg/m  Wt Readings from Last 3 Encounters:  04/11/18 167 lb (75.8 kg)  10/15/17 164 lb 6.4 oz (74.6 kg)  06/28/17 160 lb (72.6 kg)   General Appearance: Well nourished well developed, non-toxic appearing, in no apparent distress. Eyes: PERRLA, EOMs, conjunctiva no swelling or erythema ENT/Mouth: Ear canals clear with no erythema, swelling, or discharge.  TMs normal bilaterally, oropharynx clear, moist, with no exudate.   Neck: Supple, thyroid normal, no JVD, no cervical adenopathy.  Respiratory: Respiratory effort normal, breath sounds clear A&P, no wheeze, rhonchi or  rales noted.  No retractions, no accessory muscle usage Cardio: RRR with no MRGs. No noted edema.  Abdomen: Soft, + BS.  Non tender, no guarding, rebound, hernias, masses. Musculoskeletal: Full ROM, 5/5 strength, Normal gait Skin: Warm, dry without rashes, lesions, ecchymosis.  Neuro: Awake and oriented X 3, Cranial nerves intact. No cerebellar symptoms.  Psych: normal affect, Insight and Judgment appropriate.    Quentin Mulling, PA-C 11:55 AM [facility]

## 2018-04-11 ENCOUNTER — Encounter: Payer: Self-pay | Admitting: Physician Assistant

## 2018-04-11 ENCOUNTER — Ambulatory Visit: Payer: Managed Care, Other (non HMO) | Admitting: Physician Assistant

## 2018-04-11 VITALS — BP 128/66 | HR 98 | Temp 97.7°F | Resp 16 | Ht 63.5 in | Wt 167.0 lb

## 2018-04-11 DIAGNOSIS — J069 Acute upper respiratory infection, unspecified: Secondary | ICD-10-CM

## 2018-04-11 DIAGNOSIS — R7309 Other abnormal glucose: Secondary | ICD-10-CM

## 2018-04-11 DIAGNOSIS — I10 Essential (primary) hypertension: Secondary | ICD-10-CM

## 2018-04-11 DIAGNOSIS — E782 Mixed hyperlipidemia: Secondary | ICD-10-CM

## 2018-04-11 DIAGNOSIS — Z79899 Other long term (current) drug therapy: Secondary | ICD-10-CM

## 2018-04-11 LAB — COMPLETE METABOLIC PANEL WITH GFR
AG Ratio: 2.1 (calc) (ref 1.0–2.5)
ALBUMIN MSPROF: 4.6 g/dL (ref 3.6–5.1)
ALT: 14 U/L (ref 6–29)
AST: 16 U/L (ref 10–35)
Alkaline phosphatase (APISO): 96 U/L (ref 33–130)
BUN: 15 mg/dL (ref 7–25)
CO2: 27 mmol/L (ref 20–32)
CREATININE: 0.89 mg/dL (ref 0.50–0.99)
Calcium: 9.5 mg/dL (ref 8.6–10.4)
Chloride: 106 mmol/L (ref 98–110)
GFR, EST NON AFRICAN AMERICAN: 70 mL/min/{1.73_m2} (ref >=60)
GFR, Est African American: 82 mL/min/{1.73_m2} (ref >=60)
GLOBULIN: 2.2 g/dL (ref 1.9–3.7)
Glucose, Bld: 136 mg/dL — ABNORMAL HIGH (ref 65–99)
Potassium: 3.8 mmol/L (ref 3.5–5.3)
SODIUM: 142 mmol/L (ref 135–146)
TOTAL PROTEIN: 6.8 g/dL (ref 6.1–8.1)
Total Bilirubin: 0.3 mg/dL (ref 0.2–1.2)

## 2018-04-11 LAB — CBC WITH DIFFERENTIAL/PLATELET
BASOS ABS: 21 {cells}/uL (ref 0–200)
BASOS PCT: 0.4 %
EOS PCT: 3.3 %
Eosinophils Absolute: 172 cells/uL (ref 15–500)
HEMATOCRIT: 43.1 % (ref 35.0–45.0)
HEMOGLOBIN: 15.1 g/dL (ref 11.7–15.5)
LYMPHS ABS: 2137 {cells}/uL (ref 850–3900)
MCH: 32.1 pg (ref 27.0–33.0)
MCHC: 35 g/dL (ref 32.0–36.0)
MCV: 91.7 fL (ref 80.0–100.0)
MPV: 10.2 fL (ref 7.5–12.5)
Monocytes Relative: 6.7 %
NEUTROS ABS: 2522 {cells}/uL (ref 1500–7800)
Neutrophils Relative %: 48.5 %
Platelets: 207 10*3/uL (ref 140–400)
RBC: 4.7 10*6/uL (ref 3.80–5.10)
RDW: 12.6 % (ref 11.0–15.0)
Total Lymphocyte: 41.1 %
WBC mixed population: 348 cells/uL (ref 200–950)
WBC: 5.2 10*3/uL (ref 3.8–10.8)

## 2018-04-11 LAB — LIPID PANEL
CHOL/HDL RATIO: 3.8 (calc) (ref <5.0)
CHOLESTEROL: 202 mg/dL — AB (ref <200)
HDL: 53 mg/dL (ref >50)
LDL Cholesterol (Calc): 117 mg/dL (calc) — ABNORMAL HIGH
Non-HDL Cholesterol (Calc): 149 mg/dL (calc) — ABNORMAL HIGH (ref <130)
Triglycerides: 207 mg/dL — ABNORMAL HIGH (ref <150)

## 2018-04-11 LAB — TSH: TSH: 0.9 mIU/L (ref 0.40–4.50)

## 2018-04-11 MED ORDER — PROMETHAZINE-DM 6.25-15 MG/5ML PO SYRP: 5.0000 mL | ORAL_SOLUTION | Freq: Four times a day (QID) | ORAL | 1 refills | Status: DC | PRN

## 2018-04-11 MED ORDER — FLUTICASONE PROPIONATE 50 MCG/ACT NA SUSP: NASAL | 2 refills | Status: DC

## 2018-04-11 NOTE — Patient Instructions (Addendum)
[facility]  7 a.m.-6:30 p.m., Monday 7 a.m.-5 p.m., Tuesday-Friday Schedule an appointment by calling [phone].  Encourage you to get the 3D Mammogram  The 3D Mammogram is much more specific and sensitive to pick up breast cancer. For women with fibrocystic breast or lumpy breast it can be hard to determine if it is cancer or not but the 3D mammogram is able to tell this difference which cuts back on unneeded additional tests or scary call backs.   - over 40% increase in detection of breast cancer - over 40% reduction in false positives.  - fewer call backs - reduced anxiety - improved outcomes - [facility]  Switch the hydrochlorothiazide or fluid pill to in the morning Try to not drink a lot of fluids 2-3 hours before bed  Continue the albuterol and Symbicort Continue the flonase at night Add on zyrtec at night for sleep and allergies  [facility] [place], go to radiology and give them your name. They will have the order and take you back. You do not any paper work, I should get the result back today or tomorrow. This order is good for a year.   Common causes of cough OR hoarseness OR sore throat:   Allergies, Viral Infections, Acid Reflux and Bacterial Infections.   Allergies and viral infections cause a cough OR sore throat by post nasal drip and are often worse at night, can also have sneezing, lower grade fevers, clear/yellow mucus. This is best treated with allergy medications or nasal sprays.  Please get on allegra for 1-2 weeks The strongest is allegra or fexafinadine  Cheapest at [employer], [employer], [employer]   Bacterial infections are more severe than allergies or viral infections with fever, teeth pain, fatigue. This can be treated with prednisone and the same over the counter medication and after 7 days can be treated with an antibiotic.   Silent reflux/GERD can cause a cough OR sore throat OR hoarseness WITHOUT heart burn  because the esophagus that goes to the stomach and trachea that goes to the lungs are very close and when you lay down the acid can irritate your throat and lungs. This can cause hoarseness, cough, and wheezing. Please stop any alcohol or anti-inflammatories like aleve/advil/ibuprofen and start an over the counter Prilosec or omeprazole 1-2 times daily before food for 2 weeks, then switch to over the counter zantac/ratinidine or pepcid/famotadine once at night for 2 weeks.    sometimes irritation causes more irritation. Try voice rest, use sugar free cough drops to prevent coughing, and try to stop clearing your throat.   If you ever have a cough that does not go away after trying these things please make a follow up visit for further evaluation or we can refer you to a specialist. Or if you ever have shortness of breath or chest pain go to the ER.       When it comes to diets, agreement about the perfect plan isn't easy to find, even among the experts. Experts at the [facility] [employer] developed an idea known as the Healthy Eating Plate. Just imagine a plate divided into logical, healthy portions.  The emphasis is on diet quality:  Load up on vegetables and fruits - one-half of your plate: Aim for color and variety, and remember that potatoes don't count.  Go for whole grains - one-quarter of your plate: Whole wheat, barley, wheat berries, quinoa, oats, brown rice,  and foods made with them. If you want pasta, go with whole wheat pasta.  Protein power - one-quarter of your plate: Fish, chicken, beans, and nuts are all healthy, versatile protein sources. Limit red meat.  The diet, however, does go beyond the plate, offering a few other suggestions.  Use healthy plant oils, such as olive, canola, soy, corn, sunflower and peanut. Check the labels, and avoid partially hydrogenated oil, which have unhealthy trans fats.  If you're thirsty, drink water. Coffee and tea are good in  moderation, but skip sugary drinks and limit milk and dairy products to one or two daily servings.  The type of carbohydrate in the diet is more important than the amount. Some sources of carbohydrates, such as vegetables, fruits, whole grains, and beans-are healthier than others.  Finally, stay active.

## 2018-04-12 LAB — HEMOGLOBIN A1C
EAG (MMOL/L): 7 (calc)
HEMOGLOBIN A1C: 6 %{Hb} — AB (ref <5.7)
MEAN PLASMA GLUCOSE: 126 (calc)

## 2018-05-06 ENCOUNTER — Other Ambulatory Visit: Payer: Self-pay | Admitting: Physician Assistant

## 2018-05-11 ENCOUNTER — Other Ambulatory Visit: Payer: Self-pay | Admitting: Physician Assistant

## 2018-06-08 ENCOUNTER — Other Ambulatory Visit: Payer: Self-pay | Admitting: Internal Medicine

## 2018-06-27 ENCOUNTER — Other Ambulatory Visit: Payer: Self-pay | Admitting: Physician Assistant

## 2018-07-01 ENCOUNTER — Encounter: Payer: Self-pay | Admitting: Physician Assistant

## 2018-07-10 DIAGNOSIS — E663 Overweight: Secondary | ICD-10-CM | POA: Insufficient documentation

## 2018-07-10 NOTE — Progress Notes (Signed)
Complete Physical  Assessment and Plan:   Encounter for general adult medical examination with abnormal findings Mammogram ordered to be scheduled due to poor follow through  Essential hypertension - continue medications, DASH diet, exercise and monitor at home. Call if greater than 130/80.  -     CBC with Differential/Platelet -     CMP/GFR -     TSH -     Urinalysis, Routine w reflex microscopic -     Microalbumin / creatinine urine ratio -     EKG 12-Lead  Mixed hyperlipidemia -continue medications, check lipids, decrease fatty foods, increase activity.  -     Lipid panel  Medication management -     Magnesium  Recurrent major depressive disorder, in partial remission (HCC) - continue medications, stress management techniques discussed, increase water, good sleep hygiene discussed, increase exercise, and increase veggies.   Vitamin D deficiency Continue supplement  Anxiety stress management techniques discussed, increase water, good sleep hygiene discussed, increase exercise, and increase veggies.   ESOPHAGEAL STRICTURE Continue PPI/H2 blocker, diet discussed  Gastroesophageal reflux disease, esophagitis presence not specified Continue PPI/H2 blocker, diet discussed  Prediabetes -     Hemoglobin A1c  Hot flashes Continue lexapro, gabapentin; does see benefit Discussed med's effects and SE's; discussed hormonal options and clonidine; patient would like to defer at this time, will work on lifestyle and weight loss  Low back pain Negative straight leg raise; Replace mattress; back exercises provided; mobic 15 mg once daily for 2-4 weeks If not improving will consider imaging/PT/ortho referral   Future Appointments  Date Time Provider Department Center  07/18/2019  9:30 AM Judd Gaudier, NP [facility] None     HPI  62 y.o. female  presents for a complete physical. She has Depression; ESOPHAGEAL STRICTURE; Esophageal reflux; Vitamin D deficiency; Hypertension;  Hyperlipemia; Anxiety; Medication management; and Overweight (BMI 25.0-29.9) on their problem list.   She is on lexapro and gabapentin for hot flashes but reports she stil has during the day at work.   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 She admits her mattress is very old. She has also tried aleve which helped some but runs out while at work. No parasthesias or numbness;   BMI is Body mass index is 29.88 kg/m., she has not been working on diet and exercise. 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  Wt Readings from Last 3 Encounters:  07/12/18 166 lb (75.3 kg)  04/11/18 167 lb (75.8 kg)  10/15/17 164 lb 6.4 oz (74.6 kg)   Her blood pressure has been controlled at home, today their BP is BP: 124/74.   She does not workout. She denies chest pain, shortness of breath, dizziness.   She is on cholesterol medication (pravastatin 40 mg daily) and denies myalgias. Her cholesterol is at goal. The cholesterol last visit was:  Lab Results  Component Value Date   CHOL 202 (H) 04/11/2018   HDL 53 04/11/2018   LDLCALC 117 (H) 04/11/2018   TRIG 207 (H) 04/11/2018   CHOLHDL 3.8 04/11/2018  . She has not been working on diet and exercise for prediabetes,  and denies foot ulcerations, hyperglycemia, hypoglycemia , increased appetite, nausea, paresthesia of the feet, polydipsia, polyuria, visual disturbances, vomiting and weight loss. Last A1C in the office was:  Lab Results  Component Value Date   HGBA1C 6.0 (H) 04/11/2018   Patient is on Vitamin D supplement, taking 1610910000 IU daily   Lab Results  Component Value Date   VD25OH 45 03/26/2017      Current Medications:  Current Outpatient Medications on File Prior to Visit  Medication Sig Dispense Refill  . albuterol (VENTOLIN HFA) 108 (90 Base)  MCG/ACT inhaler Inhale 2 puffs into the lungs every 4 (four) hours as needed for wheezing or shortness of breath. 1 Inhaler 0  . amLODipine (NORVASC) 5 MG tablet TAKE ONE TABLET BY MOUTH DAILY 90 tablet 0  . aspirin 81 MG tablet Take 81 mg by mouth daily.    Marland Kitchen. azelastine (ASTELIN) 0.1 % nasal spray PLACE 2 SPRAYS INTO BOTH NOSTRILS 2 (TWO) TIMES DAILY. USE IN EACH NOSTRIL AS DIRECTED 30 mL 1  . Biotin 300 MCG TABS Take 1 tablet by mouth at bedtime.     . budesonide-formoterol (SYMBICORT) 80-4.5 MCG/ACT inhaler Inhale 2 puffs into the lungs 2 (two) times daily. 2 Inhaler 0  . Cholecalciferol (VITAMIN D3) 2000 UNITS TABS Take 2 tablets by mouth at bedtime.     . cloNIDine (CATAPRES) 0.1 MG tablet TAKE ONE TABLET BY MOUTH DAILY 90 tablet 0  . escitalopram (LEXAPRO) 20 MG tablet TAKE ONE TABLET BY MOUTH DAILY 90 tablet 1  . fluticasone (FLONASE) 50 MCG/ACT nasal spray SPRAY TWO SPRAYS IN EACH NOSTRIL ONCE DAILY 16 g 2  . gabapentin (NEURONTIN) 100 MG capsule TAKE 1-3 CAPSULES BY MOUTH EVERY NIGHT AT BEDTIME FOR HOT FLASHES 90 capsule 1  . latanoprost (XALATAN) 0.005 % ophthalmic solution Place 1 drop into both eyes at bedtime.    . Magnesium 250 MG TABS Take 1 tablet by mouth 2 (two) times daily.     . meloxicam (MOBIC) 15 MG tablet Take one daily with food for 2 weeks, can take with tylenol, can not take with aleve, iburpofen, then as needed daily for pain 30 tablet 1  . Omega-3 Fatty Acids (OMEGA 3 PO) Take 1 capsule by mouth 2 (two) times daily.     . pravastatin (PRAVACHOL) 40 MG tablet TAKE ONE TABLET BY MOUTH DAILY 90 tablet 0  . promethazine-dextromethorphan (PROMETHAZINE-DM) 6.25-15 MG/5ML syrup Take 5 mLs by mouth 4 (four) times daily as needed for cough. 240 mL 1  . ranitidine (ZANTAC) 300 MG tablet TAKE ONE TABLET BY MOUTH EVERY NIGHT AT BEDTIME 90 tablet 0  . terconazole (TERAZOL 7) 0.4 % vaginal cream Place 1 applicator at bedtime vaginally. 45 g 0   No current facility-administered  medications on file prior to visit.     Health Maintenance:   Immunization History  Administered Date(s) Administered  . Influenza Split 08/30/2015  . Influenza-Unspecified 10/09/2017  . Pneumococcal-Unspecified 12/11/2008  . Td 12/11/2005  . Tdap 12/30/2015  . Zoster Recombinat (Shingrix) 04/26/2017, 07/25/2017   Tetanus:2017 Pneumovax: 2010 Flu vaccine: 2017 Shingles 2018  LMP: postmenopausal Pap: 2018 neg, HPV neg, due 2021-2023 MGM:11/2014 DUE - poor follow through, will schedule  DEXA: defer Colonoscopy: 2011 EGD: 2015  Last Dental Exam: 2019 Last Eye Exam: 2018, has next week  Patient Care Team: Lucky CowboyMcKeown, William, MD as PCP - General (Internal Medicine) Louis MeckelKaplan, Robert D, MD (Inactive) as Consulting Physician (Gastroenterology) Shea EvansMody, Vaishali, MD as Consulting Physician (Obstetrics and Gynecology) Nadara Mustarduda, Marcus V, MD as Consulting Physician (Orthopedic Surgery)  Medical History:  Past Medical History:  Diagnosis Date  . Allergy   . Anxiety   .  Depression   . Esophageal stricture   . GERD (gastroesophageal reflux disease)   . Hyperlipemia   . Hypertension   . Unspecified vitamin D deficiency    Allergies Allergies  Allergen Reactions  . 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 Clinoril [Sulindac] Rash    SURGICAL HISTORY She  has a past surgical history that includes Cholecystectomy; Esophageal dilation (2009); and LEEP (2004). FAMILY HISTORY Her family history includes Cancer (age of onset: 55) in her sister; Diabetes in her sister; Glaucoma in her mother and sister; Hyperlipidemia in her mother; Hypertension in her brother, mother, and sister; Stroke in her mother. SOCIAL HISTORY She  reports that she quit smoking about 16 years ago. Her smoking use included cigarettes. She has a 1.50 pack-year smoking history. She has never used smokeless tobacco. She reports that she does not drink alcohol or use drugs.  Review  of Systems: Review of Systems  Constitutional: Negative for chills, diaphoresis, fever and malaise/fatigue.  HENT: Negative for congestion, ear pain and sore throat.   Eyes: Negative.   Respiratory: Negative for cough, shortness of breath and wheezing.   Cardiovascular: Negative for chest pain, palpitations and leg swelling.  Gastrointestinal: Negative for abdominal pain, blood in stool, constipation, diarrhea, heartburn and melena.  Genitourinary: Negative.   Musculoskeletal: Positive for back pain. Negative for falls, joint pain, myalgias and neck pain.  Skin: Negative.   Neurological: Negative for dizziness, sensory change, loss of consciousness and headaches.  Psychiatric/Behavioral: Negative for depression. The patient is not nervous/anxious and does not have insomnia.     Physical Exam: Estimated body mass index is 29.88 kg/m as calculated from the following:   Height as of this encounter: 5' 2.5" (1.588 m).   Weight as of this encounter: 166 lb (75.3 kg). BP 124/74   Pulse 79   Temp 97.7 F (36.5 C)   Ht 5' 2.5" (1.588 m)   Wt 166 lb (75.3 kg)   SpO2 98%   BMI 29.88 kg/m   General Appearance: Well nourished well developed, in no apparent distress.  Eyes: PERRLA, EOMs, conjunctiva no swelling or erythema ENT/Mouth: Ear canals normal without obstruction, swelling, erythema, or discharge.  TMs normal bilaterally with no erythema, bulging, retraction, or loss of landmark.  Oropharynx moist and clear with no exudate, erythema, or swelling.   Neck: Supple, thyroid normal. No bruits.  No cervical adenopathy Respiratory: Respiratory effort normal, Breath sounds clear A&P without wheeze, rhonchi, rales.   Cardio: RRR without murmurs, rubs or gallops. Brisk peripheral pulses without edema.  Chest: symmetric, with normal excursions Breasts: Symmetric, without lumps, nipple discharge, retractions.  Abdomen: Soft, nontender, no guarding, rebound, hernias, masses, or organomegaly.   Lymphatics: Non tender without lymphadenopathy.  Genitourinary:  Musculoskeletal: Full ROM all peripheral extremities,5/5 strength, and normal gait.  Skin: Warm, dry without rashes, lesions, ecchymosis. Neuro: Awake and oriented X 3, Cranial nerves intact, reflexes equal bilaterally. Normal muscle tone, no cerebellar symptoms. Sensation intact.  Psych:  normal affect, Insight and Judgment appropriate.   EKG: WNL no changes. AORTA SCAN: defer  Over 40 minutes of exam, counseling, chart review and critical decision making was performed  Dan Maker 9:43 AM [facility]

## 2018-07-11 ENCOUNTER — Other Ambulatory Visit: Payer: Self-pay | Admitting: Internal Medicine

## 2018-07-12 ENCOUNTER — Encounter: Payer: Self-pay | Admitting: Adult Health

## 2018-07-12 ENCOUNTER — Ambulatory Visit (INDEPENDENT_AMBULATORY_CARE_PROVIDER_SITE_OTHER): Payer: Managed Care, Other (non HMO) | Admitting: Adult Health

## 2018-07-12 VITALS — BP 124/74 | HR 79 | Temp 97.7°F | Ht 62.5 in | Wt 166.0 lb

## 2018-07-12 DIAGNOSIS — Z136 Encounter for screening for cardiovascular disorders: Secondary | ICD-10-CM | POA: Diagnosis not present

## 2018-07-12 DIAGNOSIS — Z0001 Encounter for general adult medical examination with abnormal findings: Secondary | ICD-10-CM

## 2018-07-12 DIAGNOSIS — Z13220 Encounter for screening for lipoid disorders: Secondary | ICD-10-CM | POA: Diagnosis not present

## 2018-07-12 DIAGNOSIS — Z1389 Encounter for screening for other disorder: Secondary | ICD-10-CM | POA: Diagnosis not present

## 2018-07-12 DIAGNOSIS — Z0000 Encounter for general adult medical examination without abnormal findings: Secondary | ICD-10-CM | POA: Diagnosis not present

## 2018-07-12 DIAGNOSIS — Z79899 Other long term (current) drug therapy: Secondary | ICD-10-CM

## 2018-07-12 DIAGNOSIS — M79644 Pain in right finger(s): Secondary | ICD-10-CM

## 2018-07-12 DIAGNOSIS — Z131 Encounter for screening for diabetes mellitus: Secondary | ICD-10-CM

## 2018-07-12 DIAGNOSIS — F3341 Major depressive disorder, recurrent, in partial remission: Secondary | ICD-10-CM

## 2018-07-12 DIAGNOSIS — R7309 Other abnormal glucose: Secondary | ICD-10-CM

## 2018-07-12 DIAGNOSIS — E559 Vitamin D deficiency, unspecified: Secondary | ICD-10-CM

## 2018-07-12 DIAGNOSIS — K219 Gastro-esophageal reflux disease without esophagitis: Secondary | ICD-10-CM

## 2018-07-12 DIAGNOSIS — E782 Mixed hyperlipidemia: Secondary | ICD-10-CM

## 2018-07-12 DIAGNOSIS — I10 Essential (primary) hypertension: Secondary | ICD-10-CM | POA: Diagnosis not present

## 2018-07-12 DIAGNOSIS — Z1239 Encounter for other screening for malignant neoplasm of breast: Secondary | ICD-10-CM

## 2018-07-12 DIAGNOSIS — E663 Overweight: Secondary | ICD-10-CM

## 2018-07-12 DIAGNOSIS — F419 Anxiety disorder, unspecified: Secondary | ICD-10-CM

## 2018-07-12 DIAGNOSIS — K222 Esophageal obstruction: Secondary | ICD-10-CM

## 2018-07-12 MED ORDER — PHENTERMINE HCL 37.5 MG PO TABS: 37.5000 mg | ORAL_TABLET | Freq: Every day | ORAL | 2 refills | Status: DC

## 2018-07-12 MED ORDER — HYDROCHLOROTHIAZIDE 25 MG PO TABS: ORAL_TABLET | ORAL | 0 refills | Status: DC

## 2018-07-12 MED ORDER — POTASSIUM CHLORIDE ER 10 MEQ PO TBCR: 10.0000 meq | EXTENDED_RELEASE_TABLET | Freq: Every day | ORAL | 1 refills | Status: DC

## 2018-07-12 MED ORDER — MELOXICAM 15 MG PO TABS: ORAL_TABLET | ORAL | 1 refills | Status: DC

## 2018-07-12 NOTE — Patient Instructions (Addendum)
Look into a new mattress - back exercises daily - mobic daily (with food) x 4 weeks  - let me know if not getting better    Know what a healthy weight is for you (roughly BMI <25) and aim to maintain this  Aim for 7+ servings of fruits and vegetables daily  65-80+ fluid ounces of water or unsweet tea for healthy kidneys  Limit to max 1 drink of alcohol per day; avoid smoking/tobacco  Limit animal fats in diet for cholesterol and heart health - choose grass fed whenever available  Avoid highly processed foods, and foods high in saturated/trans fats  Aim for low stress - take time to unwind and care for your mental health  Aim for 150 min of moderate intensity exercise weekly for heart health, and weights twice weekly for bone health  Aim for 7-9 hours of sleep daily      When it comes to diets, agreement about the perfect plan isn't easy to find, even among the experts. Experts at the [employer] [facility] [employer] developed an idea known as the Healthy Eating Plate. Just imagine a plate divided into logical, healthy portions.  The emphasis is on diet quality:  Load up on vegetables and fruits - one-half of your plate: Aim for color and variety, and remember that potatoes don't count.  Go for whole grains - one-quarter of your plate: Whole wheat, barley, wheat berries, quinoa, oats, brown rice, and foods made with them. If you want pasta, go with whole wheat pasta.  Protein power - one-quarter of your plate: Fish, chicken, beans, and nuts are all healthy, versatile protein sources. Limit red meat.  The diet, however, does go beyond the plate, offering a few other suggestions.  Use healthy plant oils, such as olive, canola, soy, corn, sunflower and peanut. Check the labels, and avoid partially hydrogenated oil, which have unhealthy trans fats.  If you're thirsty, drink water. Coffee and tea are good in moderation, but skip sugary drinks and limit milk and dairy products to one  or two daily servings.  The type of carbohydrate in the diet is more important than the amount. Some sources of carbohydrates, such as vegetables, fruits, whole grains, and beans-are healthier than others.  Finally, stay active.     Back Exercises The following exercises strengthen the muscles that help to support the back. They also help to keep the lower back flexible. Doing these exercises can help to prevent back pain or lessen existing pain. If you have back pain or discomfort, try doing these exercises 2-3 times each day or as told by your health care provider. When the pain goes away, do them once each day, but increase the number of times that you repeat the steps for each exercise (do more repetitions). If you do not have back pain or discomfort, do these exercises once each day or as told by your health care provider. Exercises Single Knee to Chest  Repeat these steps 3-5 times for each leg: 1. Lie on your back on a firm bed or the floor with your legs extended. 2. Bring one knee to your chest. Your other leg should stay extended and in contact with the floor. 3. Hold your knee in place by grabbing your knee or thigh. 4. Pull on your knee until you feel a gentle stretch in your lower back. 5. Hold the stretch for 10-30 seconds. 6. Slowly release and straighten your leg.  Pelvic Tilt  Repeat these steps 5-10  times: 1. Lie on your back on a firm bed or the floor with your legs extended. 2. Bend your knees so they are pointing toward the ceiling and your feet are flat on the floor. 3. Tighten your lower abdominal muscles to press your lower back against the floor. This motion will tilt your pelvis so your tailbone points up toward the ceiling instead of pointing to your feet or the floor. 4. With gentle tension and even breathing, hold this position for 5-10 seconds.  Cat-Cow  Repeat these steps until your lower back becomes more flexible: 1. Get into a hands-and-knees  position on a firm surface. Keep your hands under your shoulders, and keep your knees under your hips. You may place padding under your knees for comfort. 2. Let your head hang down, and point your tailbone toward the floor so your lower back becomes rounded like the back of a cat. 3. Hold this position for 5 seconds. 4. Slowly lift your head and point your tailbone up toward the ceiling so your back forms a sagging arch like the back of a cow. 5. Hold this position for 5 seconds.  Press-Ups  Repeat these steps 5-10 times: 1. Lie on your abdomen (face-down) on the floor. 2. Place your palms near your head, about shoulder-width apart. 3. While you keep your back as relaxed as possible and keep your hips on the floor, slowly straighten your arms to raise the top half of your body and lift your shoulders. Do not use your back muscles to raise your upper torso. You may adjust the placement of your hands to make yourself more comfortable. 4. Hold this position for 5 seconds while you keep your back relaxed. 5. Slowly return to lying flat on the floor.  Bridges  Repeat these steps 10 times: 1. Lie on your back on a firm surface. 2. Bend your knees so they are pointing toward the ceiling and your feet are flat on the floor. 3. Tighten your buttocks muscles and lift your buttocks off of the floor until your waist is at almost the same height as your knees. You should feel the muscles working in your buttocks and the back of your thighs. If you do not feel these muscles, slide your feet 1-2 inches farther away from your buttocks. 4. Hold this position for 3-5 seconds. 5. Slowly lower your hips to the starting position, and allow your buttocks muscles to relax completely.  If this exercise is too easy, try doing it with your arms crossed over your chest. Abdominal Crunches  Repeat these steps 5-10 times: 1. Lie on your back on a firm bed or the floor with your legs extended. 2. Bend your knees so  they are pointing toward the ceiling and your feet are flat on the floor. 3. Cross your arms over your chest. 4. Tip your chin slightly toward your chest without bending your neck. 5. Tighten your abdominal muscles and slowly raise your trunk (torso) high enough to lift your shoulder blades a tiny bit off of the floor. Avoid raising your torso higher than that, because it can put too much stress on your low back and it does not help to strengthen your abdominal muscles. 6. Slowly return to your starting position.  Back Lifts Repeat these steps 5-10 times: 1. Lie on your abdomen (face-down) with your arms at your sides, and rest your forehead on the floor. 2. Tighten the muscles in your legs and your buttocks. 3. Slowly lift  your chest off of the floor while you keep your hips pressed to the floor. Keep the back of your head in line with the curve in your back. Your eyes should be looking at the floor. 4. Hold this position for 3-5 seconds. 5. Slowly return to your starting position.  Contact a health care provider if:  Your back pain or discomfort gets much worse when you do an exercise.  Your back pain or discomfort does not lessen within 2 hours after you exercise. If you have any of these problems, stop doing these exercises right away. Do not do them again unless your health care provider says that you can. Get help right away if:  You develop sudden, severe back pain. If this happens, stop doing the exercises right away. Do not do them again unless your health care provider says that you can. This information is not intended to replace advice given to you by your health care provider. Make sure you discuss any questions you have with your health care provider. Document Released: 01/04/2005 Document Revised: 04/05/2016 Document Reviewed: 01/21/2015 Elsevier Interactive Patient Education  2017 [employer].

## 2018-07-14 ENCOUNTER — Other Ambulatory Visit: Payer: Self-pay | Admitting: Adult Health

## 2018-07-14 LAB — LIPID PANEL
CHOL/HDL RATIO: 4.3 (calc) (ref <5.0)
Cholesterol: 218 mg/dL — ABNORMAL HIGH (ref <200)
HDL: 51 mg/dL (ref >50)
LDL Cholesterol (Calc): 131 mg/dL (calc) — ABNORMAL HIGH
NON-HDL CHOLESTEROL (CALC): 167 mg/dL — AB (ref <130)
TRIGLYCERIDES: 217 mg/dL — AB (ref <150)

## 2018-07-14 LAB — URINALYSIS W MICROSCOPIC + REFLEX CULTURE
Bilirubin Urine: NEGATIVE
Glucose, UA: NEGATIVE
KETONES UR: NEGATIVE
Nitrites, Initial: POSITIVE — AB
SPECIFIC GRAVITY, URINE: 1.022 (ref 1.001–1.03)
SQUAMOUS EPITHELIAL / LPF: NONE SEEN /HPF (ref <=5)
WBC, UA: >=60 /HPF — AB (ref 0–5)
pH: 6 (ref 5.0–8.0)

## 2018-07-14 LAB — MAGNESIUM: Magnesium: 2.1 mg/dL (ref 1.5–2.5)

## 2018-07-14 LAB — HEMOGLOBIN A1C
HEMOGLOBIN A1C: 5.9 %{Hb} — AB (ref <5.7)
Mean Plasma Glucose: 123 (calc)
eAG (mmol/L): 6.8 (calc)

## 2018-07-14 LAB — CBC WITH DIFFERENTIAL/PLATELET
Basophils Absolute: 19 cells/uL (ref 0–200)
Basophils Relative: 0.4 %
EOS ABS: 158 {cells}/uL (ref 15–500)
Eosinophils Relative: 3.3 %
HCT: 41.3 % (ref 35.0–45.0)
HEMOGLOBIN: 14.4 g/dL (ref 11.7–15.5)
LYMPHS ABS: 2117 {cells}/uL (ref 850–3900)
MCH: 32.4 pg (ref 27.0–33.0)
MCHC: 34.9 g/dL (ref 32.0–36.0)
MCV: 92.8 fL (ref 80.0–100.0)
MPV: 10.3 fL (ref 7.5–12.5)
Monocytes Relative: 9 %
Neutro Abs: 2074 cells/uL (ref 1500–7800)
Neutrophils Relative %: 43.2 %
Platelets: 204 10*3/uL (ref 140–400)
RBC: 4.45 10*6/uL (ref 3.80–5.10)
RDW: 12.6 % (ref 11.0–15.0)
Total Lymphocyte: 44.1 %
WBC mixed population: 432 cells/uL (ref 200–950)
WBC: 4.8 10*3/uL (ref 3.8–10.8)

## 2018-07-14 LAB — COMPLETE METABOLIC PANEL WITH GFR
AG Ratio: 2 (calc) (ref 1.0–2.5)
ALKALINE PHOSPHATASE (APISO): 97 U/L (ref 33–130)
ALT: 15 U/L (ref 6–29)
AST: 14 U/L (ref 10–35)
Albumin: 4.6 g/dL (ref 3.6–5.1)
BILIRUBIN TOTAL: 0.3 mg/dL (ref 0.2–1.2)
BUN/Creatinine Ratio: 12 (calc) (ref 6–22)
BUN: 13 mg/dL (ref 7–25)
CO2: 31 mmol/L (ref 20–32)
Calcium: 9.9 mg/dL (ref 8.6–10.4)
Chloride: 104 mmol/L (ref 98–110)
Creat: 1.11 mg/dL — ABNORMAL HIGH (ref 0.50–0.99)
GFR, Est African American: 63 mL/min/{1.73_m2} (ref >=60)
GFR, Est Non African American: 54 mL/min/{1.73_m2} — ABNORMAL LOW (ref >=60)
Globulin: 2.3 g/dL (calc) (ref 1.9–3.7)
Glucose, Bld: 107 mg/dL — ABNORMAL HIGH (ref 65–99)
Potassium: 4.1 mmol/L (ref 3.5–5.3)
Sodium: 143 mmol/L (ref 135–146)
Total Protein: 6.9 g/dL (ref 6.1–8.1)

## 2018-07-14 LAB — VITAMIN D 25 HYDROXY (VIT D DEFICIENCY, FRACTURES): Vit D, 25-Hydroxy: 56 ng/mL (ref 30–100)

## 2018-07-14 LAB — URINE CULTURE
MICRO NUMBER:: 90920689
SPECIMEN QUALITY:: ADEQUATE

## 2018-07-14 LAB — MICROALBUMIN / CREATININE URINE RATIO
CREATININE, URINE: 199 mg/dL (ref 20–275)
Microalb Creat Ratio: 11 mcg/mg creat (ref <30)
Microalb, Ur: 2.2 mg/dL

## 2018-07-14 LAB — TSH: TSH: 1.21 mIU/L (ref 0.40–4.50)

## 2018-07-14 LAB — CULTURE INDICATED

## 2018-07-14 MED ORDER — NITROFURANTOIN MONOHYD MACRO 100 MG PO CAPS: 100.0000 mg | ORAL_CAPSULE | Freq: Two times a day (BID) | ORAL | 0 refills | Status: AC

## 2018-09-03 ENCOUNTER — Other Ambulatory Visit: Payer: Self-pay | Admitting: Internal Medicine

## 2018-09-25 ENCOUNTER — Other Ambulatory Visit: Payer: Self-pay | Admitting: Internal Medicine

## 2018-09-25 MED ORDER — FAMOTIDINE 40 MG PO TABS: ORAL_TABLET | ORAL | 3 refills | Status: DC

## 2018-10-05 ENCOUNTER — Other Ambulatory Visit: Payer: Self-pay | Admitting: Internal Medicine

## 2018-10-08 ENCOUNTER — Other Ambulatory Visit: Payer: Self-pay | Admitting: Adult Health

## 2018-10-17 ENCOUNTER — Other Ambulatory Visit: Payer: Self-pay | Admitting: Internal Medicine

## 2018-10-18 ENCOUNTER — Ambulatory Visit: Payer: Self-pay | Admitting: Adult Health Nurse Practitioner

## 2018-10-18 ENCOUNTER — Ambulatory Visit: Payer: Self-pay | Admitting: Adult Health

## 2018-10-18 NOTE — Progress Notes (Deleted)
FOLLOW UP 3months  Assessment and Plan:     Diagnoses and all orders for this visit:  Essential hypertension  Mixed hyperlipidemia  Recurrent major depressive disorder, in partial remission (HCC)  Anxiety  ESOPHAGEAL STRICTURE  Gastroesophageal reflux disease with esophagitis  Vitamin D deficiency  Medication management   Hypertension Well controlled with current medications  Monitor blood pressure at home; patient to call if consistently greater than 130/80 Continue DASH diet.   Reminder to go to the ER if any CP, SOB, nausea, dizziness, severe HA, changes vision/speech, left arm numbness and tingling and jaw pain .  Cholesterol Currently at goal;  Continue low cholesterol diet and exercise.  Check lipid panel.   Diabetes {with or without complications:30421263} Continue medication: Continue diet and exercise.  Perform daily foot/skin check, notify office of any concerning changes.  Check A1C  Obesity with co morbidities Long discussion about weight loss, diet, and exercise Recommended diet heavy in fruits and veggies and low in animal meats, cheeses, and dairy products, appropriate calorie intake Discussed ideal weight for height (below ***) and initial weight goal (***) Patient will work on *** Will follow up in 3 months  Vitamin D Def At goal at last visit; continue supplementation to maintain goal of 70-100 Defer Vit D level  Continue diet and meds as discussed. Further disposition pending results of labs. Discussed med's effects and SE's.   Over 30 minutes of exam, counseling, chart review, and critical decision making was performed.   Future Appointments  Date Time Provider Department Center  10/18/2018  9:00 AM Elder Negus, NP [facility] None  07/18/2019  9:30 AM Judd Gaudier, NP [facility] None    ----------------------------------------------------------------------------------------------------------------------  HPI 62 y.o. female   presents for 3 month follow up on hypertension, cholesterol, diabetes, weight and vitamin D deficiency.   BMI is There is no height or weight on file to calculate BMI., she {HAS HAS ZOX:09604} been working on diet and exercise. Wt Readings from Last 3 Encounters:  07/12/18 166 lb (75.3 kg)  04/11/18 167 lb (75.8 kg)  10/15/17 164 lb 6.4 oz (74.6 kg)    Her blood pressure {HAS HAS NOT:18834} been controlled at home, today their BP is    She {DOES_DOES VWU:98119} workout. She denies chest pain, shortness of breath, dizziness.   She {ACTION; IS/IS JYN:82956213} on cholesterol medication {Cholesterol meds:21887} and denies myalgias. Her cholesterol {ACTION; IS/IS NOT:21021397} at goal. The cholesterol last visit was:   Lab Results  Component Value Date   CHOL 218 (H) 07/12/2018   HDL 51 07/12/2018   LDLCALC 131 (H) 07/12/2018   TRIG 217 (H) 07/12/2018   CHOLHDL 4.3 07/12/2018    She {Has/has not:18111} been working on diet and exercise for prediabetes, and denies {Symptoms; diabetes w/o none:19199}. Last A1C in the office was:  Lab Results  Component Value Date   HGBA1C 5.9 (H) 07/12/2018   Patient is on Vitamin D supplement.   Lab Results  Component Value Date   VD25OH 56 07/12/2018        Current Medications:  Current Outpatient Medications on File Prior to Visit  Medication Sig  . albuterol (VENTOLIN HFA) 108 (90 Base) MCG/ACT inhaler Inhale 2 puffs into the lungs every 4 (four) hours as needed for wheezing or shortness of breath.  Marland Kitchen amLODipine (NORVASC) 5 MG tablet TAKE ONE TABLET BY MOUTH DAILY  . aspirin 81 MG tablet Take 81 mg by mouth daily.  Marland Kitchen azelastine (ASTELIN) 0.1 % nasal spray PLACE 2  SPRAYS INTO BOTH NOSTRILS 2 (TWO) TIMES DAILY. USE IN EACH NOSTRIL AS DIRECTED  . Biotin 300 MCG TABS Take 1 tablet by mouth at bedtime.   . budesonide-formoterol (SYMBICORT) 80-4.5 MCG/ACT inhaler Inhale 2 puffs into the lungs 2 (two) times daily.  . Cholecalciferol (VITAMIN D3)  2000 UNITS TABS Take 2 tablets by mouth at bedtime.   . cloNIDine (CATAPRES) 0.1 MG tablet TAKE ONE TABLET BY MOUTH DAILY  . escitalopram (LEXAPRO) 20 MG tablet TAKE ONE TABLET BY MOUTH DAILY  . famotidine (PEPCID) 40 MG tablet Take 1 tablet at bedtime for Acid Reflux  . fluticasone (FLONASE) 50 MCG/ACT nasal spray SPRAY TWO SPRAYS IN EACH NOSTRIL ONCE DAILY  . gabapentin (NEURONTIN) 100 MG capsule TAKE 1-3 CAPSULES BY MOUTH EVERY NIGHT AT BEDTIME FOR HOT FLASHES  . hydrochlorothiazide (HYDRODIURIL) 25 MG tablet TAKE ONE TABLET BY MOUTH DAILY  . latanoprost (XALATAN) 0.005 % ophthalmic solution Place 1 drop into both eyes at bedtime.  . Magnesium 250 MG TABS Take 1 tablet by mouth 2 (two) times daily.   . meloxicam (MOBIC) 15 MG tablet Take one daily with food for 2 weeks, can take with tylenol, can not take with aleve, iburpofen, then as needed daily for pain  . Omega-3 Fatty Acids (OMEGA 3 PO) Take 1 capsule by mouth 2 (two) times daily.   . phentermine (ADIPEX-P) 37.5 MG tablet Take 1 tablet (37.5 mg total) by mouth daily before breakfast.  . potassium chloride (K-DUR) 10 MEQ tablet Take 1 tablet (10 mEq total) by mouth daily.  . pravastatin (PRAVACHOL) 40 MG tablet TAKE ONE TABLET BY MOUTH DAILY  . promethazine-dextromethorphan (PROMETHAZINE-DM) 6.25-15 MG/5ML syrup Take 5 mLs by mouth 4 (four) times daily as needed for cough.  . ranitidine (ZANTAC) 300 MG tablet TAKE ONE TABLET BY MOUTH EVERY NIGHT AT BEDTIME  . terconazole (TERAZOL 7) 0.4 % vaginal cream Place 1 applicator at bedtime vaginally.   No current facility-administered medications on file prior to visit.      Allergies:  Allergies  Allergen Reactions  . 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 Clinoril [Sulindac] Rash     Medical History:  Past Medical History:  Diagnosis Date  . Allergy   . Anxiety   . Depression   . Esophageal stricture   . GERD (gastroesophageal reflux  disease)   . Hyperlipemia   . Hypertension   . Unspecified vitamin D deficiency    Family history- Reviewed and unchanged Social history- Reviewed and unchanged   Review of Systems:  ROS    Physical Exam: There were no vitals taken for this visit. Wt Readings from Last 3 Encounters:  07/12/18 166 lb (75.3 kg)  04/11/18 167 lb (75.8 kg)  10/15/17 164 lb 6.4 oz (74.6 kg)   General Appearance: Well nourished, in no apparent distress. Eyes: PERRLA, EOMs, conjunctiva no swelling or erythema Sinuses: No Frontal/maxillary tenderness ENT/Mouth: Ext aud canals clear, TMs without erythema, bulging. No erythema, swelling, or exudate on post pharynx.  Tonsils not swollen or erythematous. Hearing normal.  Neck: Supple, thyroid normal.  Respiratory: Respiratory effort normal, BS equal bilaterally without rales, rhonchi, wheezing or stridor.  Cardio: RRR with no MRGs. Brisk peripheral pulses without edema.  Abdomen: Soft, + BS.  Non tender, no guarding, rebound, hernias, masses. Lymphatics: Non tender without lymphadenopathy.  Musculoskeletal: Full ROM, 5/5 strength, {PSY - GAIT AND STATION:22860} gait Skin: Warm, dry without rashes, lesions,  ecchymosis.  Neuro: Cranial nerves intact. No cerebellar symptoms.  Psych: Awake and oriented X 3, normal affect, Insight and Judgment appropriate.     Names of Other Physician/Practitioners you currently use: 1. [facility] here for primary care 2. ***, eye doctor, last visit *** 3. ***, dentist, last visit *** Patient Care Team: Lucky Cowboy, MD as PCP - General (Internal Medicine) Louis Meckel, MD (Inactive) as Consulting Physician (Gastroenterology) Shea Evans, MD as Consulting Physician (Obstetrics and Gynecology) Nadara Mustard, MD as Consulting Physician (Orthopedic Surgery)    Screening Tests: Immunization History  Administered Date(s) Administered  . Influenza Split 08/30/2015  .  Influenza-Unspecified 10/09/2017  . Pneumococcal-Unspecified 12/11/2008  . Td 12/11/2005  . Tdap 12/30/2015  . Zoster Recombinat (Shingrix) 04/26/2017, 07/25/2017    Preventative care: Last colonoscopy: ? Last mammogram: 2015 Last pap smear/pelvic exam: 2018     Vaccinations: TD or Tdap: 2017  Influenza: Due Pneumococcal: ? Prevnar13: 2010 Shingles/Zostavax: 2018    Elder Negus, NP 8:46 AM [facility]

## 2018-10-22 DIAGNOSIS — R7309 Other abnormal glucose: Secondary | ICD-10-CM | POA: Insufficient documentation

## 2018-10-22 DIAGNOSIS — R7303 Prediabetes: Secondary | ICD-10-CM

## 2018-10-22 NOTE — Progress Notes (Signed)
FOLLOW UP  Assessment and Plan:   Hypertension Well controlled with current medications  Monitor blood pressure at home; patient to call if consistently greater than 130/80 Continue DASH diet.   Reminder to go to the ER if any CP, SOB, nausea, dizziness, severe HA, changes vision/speech, left arm numbness and tingling and jaw pain.  Cholesterol Currently above goal; working aggressively on lifestyle; if remain elevated switch from pravastatin to rosuvastatin  Continue low cholesterol diet and exercise.  Check lipid panel.   Prediabetes Continue diet and exercise.  Perform daily foot/skin check, notify office of any concerning changes.  Check A1C  Obesity with co morbidities Long discussion about weight loss, diet, and exercise Recommended diet heavy in fruits and veggies and low in animal meats, cheeses, and dairy products, appropriate calorie intake Discussed ideal weight for height and initial weight goal (155lb) Will follow up in 3 months  Vitamin D Def Below goal at last visit;  continue supplementation to maintain goal of 70-100 Defer Vit D level to next visit   Depression/anxiety Continue medications, lexapro helpful Lifestyle discussed: diet/exerise, sleep hygiene, stress management, hydration  GERD Stop ranitidine, start famotidine Discussed diet, avoiding triggers and other lifestyle changes  Cough Cough- multifactorial-  Switch to famotidine, switch allergy medication to allegra or zyrtec, then can try adding OTC PPI x 2 weeks; if not improving refer GI. Voice rest, suppress cough with OTC sugar free candy and RX med.    Continue diet and meds as discussed. Further disposition pending results of labs. Discussed med's effects and SE's.   Over 30 minutes of exam, counseling, chart review, and critical decision making was performed.   Future Appointments  Date Time Provider Department Center  07/18/2019  9:30 AM Judd Gaudier, NP [facility] None     ----------------------------------------------------------------------------------------------------------------------  HPI 62 y.o. female  presents for 3 month follow up on hypertension, cholesterol, prediabetes, weight and vitamin D deficiency.   She is on lexapro, clonidine for hot flashes and mood and finds these beneficial.   She does not have formal diagnosis of asthma but is prescribed symbicort for reports of wheezing with seasonal changes which she takes intermittently as needed and finds very beneficial. She does currently have cough, taking promethazine DM and unknown allergy medication. She is on ranitidine for gerd, has been on PPI previously, denies current reflux symptoms, burning, hoarseness.   BMI is Body mass index is 29.34 kg/m., she is working on diet, eating fruit, salads, greens, exercise is limited due to busy season at work.  Wt Readings from Last 3 Encounters:  10/23/18 163 lb (73.9 kg)  07/12/18 166 lb (75.3 kg)  04/11/18 167 lb (75.8 kg)   Her blood pressure has been controlled at home, today their BP is BP: 124/78  She does not workout. She denies chest pain, shortness of breath, dizziness.   She is on cholesterol medication Pravastatin and omega 3 and denies myalgias. Her cholesterol is not at goal. The cholesterol last visit was:   Lab Results  Component Value Date   CHOL 218 (H) 07/12/2018   HDL 51 07/12/2018   LDLCALC 131 (H) 07/12/2018   TRIG 217 (H) 07/12/2018   CHOLHDL 4.3 07/12/2018    She has been working on diet and exercise for prediabetes, and denies increased appetite, nausea, paresthesia of the feet, polydipsia, polyuria and visual disturbances. Last A1C in the office was:  Lab Results  Component Value Date   HGBA1C 5.9 (H) 07/12/2018   Patient is on  Vitamin D supplement.   Lab Results  Component Value Date   VD25OH 56 07/12/2018       Current Medications:  Current Outpatient Medications on File Prior to Visit  Medication Sig   . albuterol (VENTOLIN HFA) 108 (90 Base) MCG/ACT inhaler Inhale 2 puffs into the lungs every 4 (four) hours as needed for wheezing or shortness of breath.  Marland Kitchen. amLODipine (NORVASC) 5 MG tablet TAKE ONE TABLET BY MOUTH DAILY  . aspirin 81 MG tablet Take 81 mg by mouth daily.  Marland Kitchen. azelastine (ASTELIN) 0.1 % nasal spray PLACE 2 SPRAYS INTO BOTH NOSTRILS 2 (TWO) TIMES DAILY. USE IN EACH NOSTRIL AS DIRECTED  . Biotin 300 MCG TABS Take 1 tablet by mouth at bedtime.   . budesonide-formoterol (SYMBICORT) 80-4.5 MCG/ACT inhaler Inhale 2 puffs into the lungs 2 (two) times daily.  . Cholecalciferol (VITAMIN D3) 2000 UNITS TABS Take 2 tablets by mouth at bedtime.   . cloNIDine (CATAPRES) 0.1 MG tablet TAKE ONE TABLET BY MOUTH DAILY  . escitalopram (LEXAPRO) 20 MG tablet TAKE ONE TABLET BY MOUTH DAILY  . fluticasone (FLONASE) 50 MCG/ACT nasal spray SPRAY TWO SPRAYS IN EACH NOSTRIL ONCE DAILY  . hydrochlorothiazide (HYDRODIURIL) 25 MG tablet TAKE ONE TABLET BY MOUTH DAILY  . latanoprost (XALATAN) 0.005 % ophthalmic solution Place 1 drop into both eyes at bedtime.  . Magnesium 250 MG TABS Take 1 tablet by mouth 2 (two) times daily.   . potassium chloride (K-DUR) 10 MEQ tablet Take 1 tablet (10 mEq total) by mouth daily.  . pravastatin (PRAVACHOL) 40 MG tablet TAKE ONE TABLET BY MOUTH DAILY  . promethazine-dextromethorphan (PROMETHAZINE-DM) 6.25-15 MG/5ML syrup Take 5 mLs by mouth 4 (four) times daily as needed for cough.  . famotidine (PEPCID) 40 MG tablet Take 1 tablet at bedtime for Acid Reflux (Patient not taking: Reported on 10/23/2018)  . Omega-3 Fatty Acids (OMEGA 3 PO) Take 1 capsule by mouth 2 (two) times daily.    No current facility-administered medications on file prior to visit.      Allergies:  Allergies  Allergen Reactions  . 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 Clinoril [Sulindac] Rash     Medical History:  Past Medical History:  Diagnosis Date   . Allergy   . Anxiety   . Depression   . Esophageal stricture   . GERD (gastroesophageal reflux disease)   . Hyperlipemia   . Hypertension   . Unspecified vitamin D deficiency    Family history- Reviewed and unchanged Social history- Reviewed and unchanged   Review of Systems:  Review of Systems  Constitutional: Negative for malaise/fatigue and weight loss.  HENT: Negative for congestion, hearing loss, sore throat and tinnitus.   Eyes: Negative for blurred vision and double vision.  Respiratory: Negative for cough, sputum production, shortness of breath and wheezing.   Cardiovascular: Negative for chest pain, palpitations, orthopnea, claudication and leg swelling.  Gastrointestinal: Negative for abdominal pain, blood in stool, constipation, diarrhea, heartburn, melena, nausea and vomiting.  Genitourinary: Negative.   Musculoskeletal: Negative for joint pain and myalgias.  Skin: Negative for rash.  Neurological: Negative for dizziness, tingling, sensory change, weakness and headaches.  Endo/Heme/Allergies: Positive for environmental allergies. Negative for polydipsia.  Psychiatric/Behavioral: Negative.   All other systems reviewed and are negative.    Physical Exam: BP 124/78   Pulse 86   Temp (!) 97.5 F (36.4 C)   Ht 5' 2.5" (1.588  m)   Wt 163 lb (73.9 kg)   SpO2 98%   BMI 29.34 kg/m  Wt Readings from Last 3 Encounters:  10/23/18 163 lb (73.9 kg)  07/12/18 166 lb (75.3 kg)  04/11/18 167 lb (75.8 kg)   General Appearance: Well nourished, in no apparent distress. Eyes: PERRLA, EOMs, conjunctiva no swelling or erythema Sinuses: No Frontal/maxillary tenderness ENT/Mouth: Ext aud canals clear, TMs without erythema, bulging. No erythema, swelling, or exudate on post pharynx.  Tonsils not swollen or erythematous. Hearing normal.  Neck: Supple, thyroid normal.  Respiratory: Respiratory effort normal, BS equal bilaterally without rales, rhonchi, wheezing or stridor.   Cardio: RRR with no MRGs. Brisk peripheral pulses without edema.  Abdomen: Soft, + BS.  Non tender, no guarding, rebound, hernias, masses. Lymphatics: Non tender without lymphadenopathy.  Musculoskeletal: Full ROM, 5/5 strength, Normal gait Skin: Warm, dry without rashes, lesions, ecchymosis.  Neuro: Cranial nerves intact. No cerebellar symptoms.  Psych: Awake and oriented X 3, normal affect, Insight and Judgment appropriate.    Dan Maker, NP 11:16 AM [facility]

## 2018-10-23 ENCOUNTER — Ambulatory Visit: Payer: Managed Care, Other (non HMO) | Admitting: Adult Health

## 2018-10-23 ENCOUNTER — Encounter: Payer: Self-pay | Admitting: Adult Health

## 2018-10-23 VITALS — BP 124/78 | HR 86 | Temp 97.5°F | Ht 62.5 in | Wt 163.0 lb

## 2018-10-23 DIAGNOSIS — K219 Gastro-esophageal reflux disease without esophagitis: Secondary | ICD-10-CM

## 2018-10-23 DIAGNOSIS — R7303 Prediabetes: Secondary | ICD-10-CM | POA: Diagnosis not present

## 2018-10-23 DIAGNOSIS — E782 Mixed hyperlipidemia: Secondary | ICD-10-CM | POA: Diagnosis not present

## 2018-10-23 DIAGNOSIS — F419 Anxiety disorder, unspecified: Secondary | ICD-10-CM

## 2018-10-23 DIAGNOSIS — Z79899 Other long term (current) drug therapy: Secondary | ICD-10-CM | POA: Diagnosis not present

## 2018-10-23 DIAGNOSIS — R059 Cough, unspecified: Secondary | ICD-10-CM

## 2018-10-23 DIAGNOSIS — E663 Overweight: Secondary | ICD-10-CM

## 2018-10-23 DIAGNOSIS — I10 Essential (primary) hypertension: Secondary | ICD-10-CM | POA: Diagnosis not present

## 2018-10-23 DIAGNOSIS — M79644 Pain in right finger(s): Secondary | ICD-10-CM

## 2018-10-23 DIAGNOSIS — R05 Cough: Secondary | ICD-10-CM

## 2018-10-23 DIAGNOSIS — E559 Vitamin D deficiency, unspecified: Secondary | ICD-10-CM | POA: Diagnosis not present

## 2018-10-23 DIAGNOSIS — F3341 Major depressive disorder, recurrent, in partial remission: Secondary | ICD-10-CM

## 2018-10-23 MED ORDER — MELOXICAM 15 MG PO TABS: ORAL_TABLET | ORAL | 1 refills | Status: DC

## 2018-10-23 MED ORDER — ROSUVASTATIN CALCIUM 40 MG PO TABS: 40.0000 mg | ORAL_TABLET | Freq: Every day | ORAL | 1 refills | Status: DC

## 2018-10-23 NOTE — Patient Instructions (Addendum)
Goals    . Weight (lb) < 155 lb (70.3 kg)       Switch allergy medication to allegra (fexofenadine) or zyrtec (certirizine)   Change acid reflux medication from zantac (ranitidine) to pepcid (famotidine)  Changing cholesterol medication from pravastatin to rosuvastatin      Aim for 7+ servings of fruits and vegetables daily  65-80+ fluid ounces of water or unsweet tea for healthy kidneys  Limit to max 1 drink of alcohol per day; avoid smoking/tobacco  Limit animal fats in diet for cholesterol and heart health - choose grass fed whenever available  Avoid highly processed foods, and foods high in saturated/trans fats  Aim for low stress - take time to unwind and care for your mental health  Aim for 150 min of moderate intensity exercise weekly for heart health, and weights twice weekly for bone health  Aim for 7-9 hours of sleep daily    Common causes of cough OR hoarseness OR sore throat:   Allergies, Viral Infections, Acid Reflux and Bacterial Infections.   Allergies and viral infections cause a cough OR sore throat by post nasal drip and are often worse at night, can also have sneezing, lower grade fevers, clear/yellow mucus. This is best treated with allergy medications or nasal sprays.  Please get on allegra for 1-2 weeks The strongest is allegra or fexafinadine  Cheapest at [employer], [employer], [employer]   Bacterial infections are more severe than allergies or viral infections with fever, teeth pain, fatigue. This can be treated with prednisone and the same over the counter medication and after 7 days can be treated with an antibiotic.   Silent reflux/GERD can cause a cough OR sore throat OR hoarseness WITHOUT heart burn because the esophagus that goes to the stomach and trachea that goes to the lungs are very close and when you lay down the acid can irritate your throat and lungs. This can cause hoarseness, cough, and wheezing. Please stop any alcohol or anti-inflammatories  like aleve/advil/ibuprofen and start an over the counter Prilosec or omeprazole 1-2 times daily before food for 2 weeks, then switch to over the counter zantac/ratinidine or pepcid/famotadine once at night for 2 weeks.    sometimes irritation causes more irritation. Try voice rest, use sugar free cough drops to prevent coughing, and try to stop clearing your throat.   If you ever have a cough that does not go away after trying these things please make a follow up visit for further evaluation or we can refer you to a specialist. Or if you ever have shortness of breath or chest pain go to the ER.        Rosuvastatin Tablets What is this medicine? ROSUVASTATIN (roe SOO va sta tin) is known as a HMG-CoA reductase inhibitor or 'statin'. It lowers cholesterol and triglycerides in the blood. This drug may also reduce the risk of heart attack, stroke, or other health problems in patients with risk factors for heart disease. Diet and lifestyle changes are often used with this drug. This medicine may be used for other purposes; ask your health care provider or pharmacist if you have questions. COMMON BRAND NAME(S): Crestor What should I tell my health care provider before I take this medicine? They need to know if you have any of these conditions: -frequently drink alcoholic beverages -kidney disease -liver disease -muscle aches or weakness -other medical condition -an unusual or allergic reaction to rosuvastatin, other medicines, foods, dyes, or preservatives -pregnant or trying to get  pregnant -breast-feeding How should I use this medicine? Take this medicine by mouth with a glass of water. Follow the directions on the prescription label. Do not cut, crush or chew this medicine. You can take this medicine with or without food. Take your doses at regular intervals. Do not take your medicine more often than directed. Talk to your pediatrician regarding the use of this medicine in children.  While this drug may be prescribed for children as young as 8 years old for selected conditions, precautions do apply. Overdosage: If you think you have taken too much of this medicine contact a poison control center or emergency room at once. NOTE: This medicine is only for you. Do not share this medicine with others. What if I miss a dose? If you miss a dose, take it as soon as you can. Do not take 2 doses within 12 hours of each other. If there are less than 12 hours until your next dose, take only that dose. Do not take double or extra doses. What may interact with this medicine? Do not take this medicine with any of the following medications: -herbal medicines like red yeast rice This medicine may also interact with the following medications: -alcohol -antacids containing aluminum hydroxide or magnesium hydroxide -cyclosporine -other medicines for high cholesterol -some medicines for HIV infection -warfarin This list may not describe all possible interactions. Give your health care provider a list of all the medicines, herbs, non-prescription drugs, or dietary supplements you use. Also tell them if you smoke, drink alcohol, or use illegal drugs. Some items may interact with your medicine. What should I watch for while using this medicine? Visit your doctor or health care professional for regular check-ups. You may need regular tests to make sure your liver is working properly. Tell your doctor or health care professional right away if you get any unexplained muscle pain, tenderness, or weakness, especially if you also have a fever and tiredness. Your doctor or health care professional may tell you to stop taking this medicine if you develop muscle problems. If your muscle problems do not go away after stopping this medicine, contact your health care professional. This medicine may affect blood sugar levels. If you have diabetes, check with your doctor or health care professional before you  change your diet or the dose of your diabetic medicine. Avoid taking antacids containing aluminum, calcium or magnesium within 2 hours of taking this medicine. This drug is only part of a total heart-health program. Your doctor or a dietician can suggest a low-cholesterol and low-fat diet to help. Avoid alcohol and smoking, and keep a proper exercise schedule. Do not use this drug if you are pregnant or breast-feeding. Serious side effects to an unborn child or to an infant are possible. Talk to your doctor or pharmacist for more information. What side effects may I notice from receiving this medicine? Side effects that you should report to your doctor or health care professional as soon as possible: -allergic reactions like skin rash, itching or hives, swelling of the face, lips, or tongue -dark urine -fever -joint pain -muscle cramps, pain -redness, blistering, peeling or loosening of the skin, including inside the mouth -trouble passing urine or change in the amount of urine -unusually weak or tired -yellowing of the eyes or skin Side effects that usually do not require medical attention (report to your doctor or health care professional if they continue or are bothersome): -constipation -heartburn -nausea -stomach gas, pain, upset This list may  not describe all possible side effects. Call your doctor for medical advice about side effects. You may report side effects to FDA at 1-800-FDA-1088. Where should I keep my medicine? Keep out of the reach of children. Store at room temperature between 20 and 25 degrees C (68 and 77 degrees F). Keep container tightly closed (protect from moisture). Throw away any unused medicine after the expiration date. NOTE: This sheet is a summary. It may not cover all possible information. If you have questions about this medicine, talk to your doctor, pharmacist, or health care provider.  2018 Elsevier/Gold Standard (2015-05-13 13:33:08)

## 2018-10-24 LAB — CBC WITH DIFFERENTIAL/PLATELET
BASOS PCT: 0.9 %
Basophils Absolute: 41 cells/uL (ref 0–200)
EOS PCT: 5 %
Eosinophils Absolute: 230 cells/uL (ref 15–500)
HEMATOCRIT: 41.9 % (ref 35.0–45.0)
HEMOGLOBIN: 14.4 g/dL (ref 11.7–15.5)
LYMPHS ABS: 1918 {cells}/uL (ref 850–3900)
MCH: 32.1 pg (ref 27.0–33.0)
MCHC: 34.4 g/dL (ref 32.0–36.0)
MCV: 93.5 fL (ref 80.0–100.0)
MPV: 10.2 fL (ref 7.5–12.5)
Monocytes Relative: 10.1 %
NEUTROS ABS: 1946 {cells}/uL (ref 1500–7800)
Neutrophils Relative %: 42.3 %
Platelets: 200 10*3/uL (ref 140–400)
RBC: 4.48 10*6/uL (ref 3.80–5.10)
RDW: 12.3 % (ref 11.0–15.0)
Total Lymphocyte: 41.7 %
WBC: 4.6 10*3/uL (ref 3.8–10.8)
WBCMIX: 465 {cells}/uL (ref 200–950)

## 2018-10-24 LAB — LIPID PANEL
CHOL/HDL RATIO: 3.3 (calc) (ref <5.0)
Cholesterol: 175 mg/dL (ref <200)
HDL: 53 mg/dL (ref >50)
LDL Cholesterol (Calc): 98 mg/dL (calc)
NON-HDL CHOLESTEROL (CALC): 122 mg/dL (ref <130)
TRIGLYCERIDES: 139 mg/dL (ref <150)

## 2018-10-24 LAB — COMPLETE METABOLIC PANEL WITH GFR
AG Ratio: 1.8 (calc) (ref 1.0–2.5)
ALT: 14 U/L (ref 6–29)
AST: 15 U/L (ref 10–35)
Albumin: 4.4 g/dL (ref 3.6–5.1)
Alkaline phosphatase (APISO): 84 U/L (ref 33–130)
BUN/Creatinine Ratio: 16 (calc) (ref 6–22)
BUN: 17 mg/dL (ref 7–25)
CALCIUM: 9.9 mg/dL (ref 8.6–10.4)
CO2: 35 mmol/L — AB (ref 20–32)
CREATININE: 1.04 mg/dL — AB (ref 0.50–0.99)
Chloride: 104 mmol/L (ref 98–110)
GFR, EST AFRICAN AMERICAN: 68 mL/min/{1.73_m2} (ref >=60)
GFR, Est Non African American: 58 mL/min/{1.73_m2} — ABNORMAL LOW (ref >=60)
Globulin: 2.5 g/dL (calc) (ref 1.9–3.7)
Glucose, Bld: 135 mg/dL — ABNORMAL HIGH (ref 65–99)
POTASSIUM: 3.5 mmol/L (ref 3.5–5.3)
SODIUM: 145 mmol/L (ref 135–146)
TOTAL PROTEIN: 6.9 g/dL (ref 6.1–8.1)
Total Bilirubin: 0.4 mg/dL (ref 0.2–1.2)

## 2018-10-24 LAB — MAGNESIUM: Magnesium: 2.4 mg/dL (ref 1.5–2.5)

## 2018-10-24 LAB — HEMOGLOBIN A1C
HEMOGLOBIN A1C: 6 %{Hb} — AB (ref <5.7)
MEAN PLASMA GLUCOSE: 126 (calc)
eAG (mmol/L): 7 (calc)

## 2018-10-24 LAB — TSH: TSH: 0.65 m[IU]/L (ref 0.40–4.50)

## 2018-11-13 ENCOUNTER — Other Ambulatory Visit: Payer: Self-pay | Admitting: Internal Medicine

## 2018-12-01 ENCOUNTER — Other Ambulatory Visit: Payer: Self-pay | Admitting: Adult Health

## 2018-12-28 ENCOUNTER — Other Ambulatory Visit: Payer: Self-pay | Admitting: Adult Health

## 2019-01-04 ENCOUNTER — Other Ambulatory Visit: Payer: Self-pay | Admitting: Internal Medicine

## 2019-01-14 ENCOUNTER — Other Ambulatory Visit: Payer: Self-pay | Admitting: Physician Assistant

## 2019-01-14 DIAGNOSIS — J069 Acute upper respiratory infection, unspecified: Secondary | ICD-10-CM

## 2019-01-23 NOTE — Progress Notes (Deleted)
FOLLOW UP  Assessment and Plan:   Hypertension Well controlled with current medications  Monitor blood pressure at home; patient to call if consistently greater than 130/80 Continue DASH diet.   Reminder to go to the ER if any CP, SOB, nausea, dizziness, severe HA, changes vision/speech, left arm numbness and tingling and jaw pain.  Cholesterol Currently at goal; continue *** Continue low cholesterol diet and exercise.  Check lipid panel.   Prediabetes Continue diet and exercise.  Perform daily foot/skin check, notify office of any concerning changes.  Check A1C  Overweight with comorbidities Long discussion about weight loss, diet, and exercise Recommended diet heavy in fruits and veggies and low in animal meats, cheeses, and dairy products, appropriate calorie intake Discussed ideal weight for height and initial weight goal (155lb) Will follow up in 3 months  Vitamin D Def Near goal at last check continue supplementation to maintain goal of 60-100 Defer Vit D level to next CPE  Depression/anxiety Continue medications, lexapro helpful Lifestyle discussed: diet/exerise, sleep hygiene, stress management, hydration  GERD Well controlled by famotidine PRN  Discussed diet, avoiding triggers and other lifestyle changes   Continue diet and meds as discussed. Further disposition pending results of labs. Discussed med's effects and SE's.   Over 30 minutes of exam, counseling, chart review, and critical decision making was performed.   Future Appointments  Date Time Provider Department Center  01/27/2019  2:30 PM Judd Gaudierorbett, Norina Cowper, NP [facility] None  07/18/2019  9:30 AM Judd Gaudierorbett, Yancarlos Berthold, NP [facility] None    ----------------------------------------------------------------------------------------------------------------------  HPI 63 y.o. female  presents for 3 month follow up on hypertension, cholesterol, prediabetes, weight and vitamin D deficiency.   She is on lexapro,  clonidine for hot flashes and mood and finds these beneficial.   She does not have formal diagnosis of asthma but is prescribed symbicort for reports of wheezing with seasonal changes which she takes intermittently as needed and finds very beneficial.   She is on famotidine PRN for gerd, has been on PPI previously, denies current reflux symptoms, burning, hoarseness.   BMI is There is no height or weight on file to calculate BMI., she is working on diet, eating fruit, salads, greens, exercise is limited due to busy season at work. Goal weight is 155 lb *** Wt Readings from Last 3 Encounters:  10/23/18 163 lb (73.9 kg)  07/12/18 166 lb (75.3 kg)  04/11/18 167 lb (75.8 kg)   Her blood pressure has been controlled at home, today their BP is    She does not workout. She denies chest pain, shortness of breath, dizziness.  ***prava vs rosuv   She is on cholesterol medication Pravastatin and omega 3 and denies myalgias. Her cholesterol is at goal. The cholesterol last visit was:   Lab Results  Component Value Date   CHOL 175 10/23/2018   HDL 53 10/23/2018   LDLCALC 98 10/23/2018   TRIG 139 10/23/2018   CHOLHDL 3.3 10/23/2018    She has been working on diet and exercise for prediabetes, and denies increased appetite, nausea, paresthesia of the feet, polydipsia, polyuria and visual disturbances. Last A1C in the office was:  Lab Results  Component Value Date   HGBA1C 6.0 (H) 10/23/2018   Patient is on Vitamin D supplement.   Lab Results  Component Value Date   VD25OH 56 07/12/2018     Lab Results  Component Value Date   GFRAA 68 10/23/2018     Current Medications:  Current Outpatient Medications on File  Prior to Visit  Medication Sig  . albuterol (VENTOLIN HFA) 108 (90 Base) MCG/ACT inhaler Inhale 2 puffs into the lungs every 4 (four) hours as needed for wheezing or shortness of breath.  Marland Kitchen amLODipine (NORVASC) 5 MG tablet TAKE ONE TABLET BY MOUTH DAILY  . aspirin 81 MG tablet  Take 81 mg by mouth daily.  Marland Kitchen azelastine (ASTELIN) 0.1 % nasal spray PLACE 2 SPRAYS INTO BOTH NOSTRILS 2 (TWO) TIMES DAILY. USE IN EACH NOSTRIL AS DIRECTED  . Biotin 300 MCG TABS Take 1 tablet by mouth at bedtime.   . budesonide-formoterol (SYMBICORT) 80-4.5 MCG/ACT inhaler Inhale 2 puffs into the lungs 2 (two) times daily.  . CHOLECALCIFEROL PO Take 10,000 Units by mouth daily.  . cloNIDine (CATAPRES) 0.1 MG tablet TAKE ONE TABLET BY MOUTH DAILY  . escitalopram (LEXAPRO) 20 MG tablet TAKE ONE TABLET BY MOUTH DAILY  . famotidine (PEPCID) 40 MG tablet Take 1 tablet at bedtime for Acid Reflux (Patient not taking: Reported on 10/23/2018)  . fluticasone (FLONASE) 50 MCG/ACT nasal spray SPRAY TWO SPRAYS IN EACH NOSTRIL ONCE DAILY  . hydrochlorothiazide (HYDRODIURIL) 25 MG tablet TAKE ONE TABLET BY MOUTH DAILY  . latanoprost (XALATAN) 0.005 % ophthalmic solution Place 1 drop into both eyes at bedtime.  . Magnesium 250 MG TABS Take 1 tablet by mouth 2 (two) times daily.   . meloxicam (MOBIC) 15 MG tablet Take one daily with food for 2 weeks, can take with tylenol, can not take with aleve, iburpofen, then as needed daily for pain  . Omega-3 Fatty Acids (OMEGA 3 PO) Take 1 capsule by mouth 2 (two) times daily.   . potassium chloride (K-DUR) 10 MEQ tablet TAKE ONE TABLET BY MOUTH DAILY  . pravastatin (PRAVACHOL) 40 MG tablet Take 1 tablet at Bedtime for Cholesterol  . promethazine-dextromethorphan (PROMETHAZINE-DM) 6.25-15 MG/5ML syrup TAKE 5 MLS BY MOUTH FOUR TIMES A DAY AS NEEDED FOR COUGH  . rosuvastatin (CRESTOR) 40 MG tablet Take 1 tablet (40 mg total) by mouth daily.   No current facility-administered medications on file prior to visit.      Allergies:  Allergies  Allergen Reactions  . 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 Clinoril [Sulindac] Rash     Medical History:  Past Medical History:  Diagnosis Date  . Allergy   . Anxiety   . Depression    . Esophageal stricture   . GERD (gastroesophageal reflux disease)   . Hyperlipemia   . Hypertension   . Unspecified vitamin D deficiency    Family history- Reviewed and unchanged Social history- Reviewed and unchanged   Review of Systems:  Review of Systems  Constitutional: Negative for malaise/fatigue and weight loss.  HENT: Negative for congestion, hearing loss, sore throat and tinnitus.   Eyes: Negative for blurred vision and double vision.  Respiratory: Negative for cough, sputum production, shortness of breath and wheezing.   Cardiovascular: Negative for chest pain, palpitations, orthopnea, claudication and leg swelling.  Gastrointestinal: Negative for abdominal pain, blood in stool, constipation, diarrhea, heartburn, melena, nausea and vomiting.  Genitourinary: Negative.   Musculoskeletal: Negative for joint pain and myalgias.  Skin: Negative for rash.  Neurological: Negative for dizziness, tingling, sensory change, weakness and headaches.  Endo/Heme/Allergies: Positive for environmental allergies. Negative for polydipsia.  Psychiatric/Behavioral: Negative.   All other systems reviewed and are negative.    Physical Exam: There were no vitals taken for this visit. Wt Readings from  Last 3 Encounters:  10/23/18 163 lb (73.9 kg)  07/12/18 166 lb (75.3 kg)  04/11/18 167 lb (75.8 kg)   General Appearance: Well nourished, in no apparent distress. Eyes: PERRLA, EOMs, conjunctiva no swelling or erythema Sinuses: No Frontal/maxillary tenderness ENT/Mouth: Ext aud canals clear, TMs without erythema, bulging. No erythema, swelling, or exudate on post pharynx.  Tonsils not swollen or erythematous. Hearing normal.  Neck: Supple, thyroid normal.  Respiratory: Respiratory effort normal, BS equal bilaterally without rales, rhonchi, wheezing or stridor.  Cardio: RRR with no MRGs. Brisk peripheral pulses without edema.  Abdomen: Soft, + BS.  Non tender, no guarding, rebound, hernias,  masses. Lymphatics: Non tender without lymphadenopathy.  Musculoskeletal: Full ROM, 5/5 strength, Normal gait Skin: Warm, dry without rashes, lesions, ecchymosis.  Neuro: Cranial nerves intact. No cerebellar symptoms.  Psych: Awake and oriented X 3, normal affect, Insight and Judgment appropriate.    Dan Maker, NP 3:46 PM [facility]

## 2019-01-27 ENCOUNTER — Ambulatory Visit: Payer: Self-pay | Admitting: Adult Health

## 2019-02-04 NOTE — Progress Notes (Signed)
FOLLOW UP  Assessment and Plan:   Hypertension Well controlled with current medications  Monitor blood pressure at home; patient to call if consistently greater than 130/80 Continue DASH diet.   Reminder to go to the ER if any CP, SOB, nausea, dizziness, severe HA, changes vision/speech, left arm numbness and tingling and jaw pain.  Cholesterol Currently at goal; continue rosuvastatin 40 mg daily  Continue low cholesterol diet and exercise.  Check lipid panel.   Prediabetes Continue diet and exercise.  Perform daily foot/skin check, notify office of any concerning changes.  Check A1C  Overweight with comorbidities Commended on weight loss progress thus far Long discussion about weight loss, diet, and exercise Recommended diet heavy in fruits and veggies and low in animal meats, cheeses, and dairy products, appropriate calorie intake Discussed ideal weight for height and initial weight goal (<150lb) Will follow up in 3 months  Vitamin D Def Near goal at last check continue supplementation to maintain goal of 60-100 Defer Vit D level to next CPE  Depression/anxiety Continue medications, lexapro helpful Lifestyle discussed: diet/exerise, sleep hygiene, stress management, hydration  GERD Well controlled by famotidine PRN  Discussed diet, avoiding triggers and other lifestyle changes   Continue diet and meds as discussed. Further disposition pending results of labs. Discussed med's effects and SE's.   Over 30 minutes of exam, counseling, chart review, and critical decision making was performed.   Future Appointments  Date Time Provider Department Center  07/18/2019  9:30 AM Judd Gaudier, NP [facility] None    ----------------------------------------------------------------------------------------------------------------------  HPI 63 y.o. female  presents for 3 month follow up on hypertension, cholesterol, prediabetes, weight and vitamin D deficiency.   she has a  diagnosis of depression/anxiety and is currently on lexapro 20 mg daily, reports symptoms are well controlled on current regimen.   She does not have formal diagnosis of asthma but is prescribed symbicort for reports of wheezing with seasonal changes which she takes intermittently as needed and finds very beneficial.   She is on famotidine PRN for gerd, has been on PPI previously, denies current reflux symptoms, burning, hoarseness.    BMI is Body mass index is 28.26 kg/m., she is working on diet, eating fruit, salads, greens, exercise is limited due to busy season at work. Goal weight is 150 lb.  Wt Readings from Last 3 Encounters:  02/05/19 157 lb (71.2 kg)  10/23/18 163 lb (73.9 kg)  07/12/18 166 lb (75.3 kg)   Her blood pressure has been controlled at home, today their BP is BP: 134/82  She does not workout. She denies chest pain, shortness of breath, dizziness.   She is on cholesterol medication Rosuvastatin 40 mg daily and denies myalgias. Her cholesterol is at goal. The cholesterol last visit was:   Lab Results  Component Value Date   CHOL 175 10/23/2018   HDL 53 10/23/2018   LDLCALC 98 10/23/2018   TRIG 139 10/23/2018   CHOLHDL 3.3 10/23/2018    She has been working on diet and exercise for prediabetes, and denies increased appetite, nausea, paresthesia of the feet, polydipsia, polyuria and visual disturbances. Last A1C in the office was:  Lab Results  Component Value Date   HGBA1C 6.0 (H) 10/23/2018   Patient is on Vitamin D supplement.   Lab Results  Component Value Date   VD25OH 56 07/12/2018     Lab Results  Component Value Date   GFRAA 68 10/23/2018     Current Medications:  Current Outpatient Medications on  File Prior to Visit  Medication Sig  . albuterol (VENTOLIN HFA) 108 (90 Base) MCG/ACT inhaler Inhale 2 puffs into the lungs every 4 (four) hours as needed for wheezing or shortness of breath.  Marland Kitchen amLODipine (NORVASC) 5 MG tablet TAKE ONE TABLET BY MOUTH  DAILY  . aspirin 81 MG tablet Take 81 mg by mouth daily.  Marland Kitchen azelastine (ASTELIN) 0.1 % nasal spray PLACE 2 SPRAYS INTO BOTH NOSTRILS 2 (TWO) TIMES DAILY. USE IN EACH NOSTRIL AS DIRECTED  . Biotin 300 MCG TABS Take 1 tablet by mouth at bedtime.   . budesonide-formoterol (SYMBICORT) 80-4.5 MCG/ACT inhaler Inhale 2 puffs into the lungs 2 (two) times daily.  . CHOLECALCIFEROL PO Take 10,000 Units by mouth daily.  . cloNIDine (CATAPRES) 0.1 MG tablet TAKE ONE TABLET BY MOUTH DAILY  . escitalopram (LEXAPRO) 20 MG tablet TAKE ONE TABLET BY MOUTH DAILY  . famotidine (PEPCID) 40 MG tablet Take 1 tablet at bedtime for Acid Reflux  . fluticasone (FLONASE) 50 MCG/ACT nasal spray SPRAY TWO SPRAYS IN EACH NOSTRIL ONCE DAILY  . hydrochlorothiazide (HYDRODIURIL) 25 MG tablet TAKE ONE TABLET BY MOUTH DAILY  . latanoprost (XALATAN) 0.005 % ophthalmic solution Place 1 drop into both eyes at bedtime.  . Magnesium 250 MG TABS Take 1 tablet by mouth 2 (two) times daily.   . meloxicam (MOBIC) 15 MG tablet Take one daily with food for 2 weeks, can take with tylenol, can not take with aleve, iburpofen, then as needed daily for pain  . Omega-3 Fatty Acids (OMEGA 3 PO) Take 1 capsule by mouth 2 (two) times daily.   . potassium chloride (K-DUR) 10 MEQ tablet TAKE ONE TABLET BY MOUTH DAILY  . promethazine-dextromethorphan (PROMETHAZINE-DM) 6.25-15 MG/5ML syrup TAKE 5 MLS BY MOUTH FOUR TIMES A DAY AS NEEDED FOR COUGH  . rosuvastatin (CRESTOR) 40 MG tablet Take 1 tablet (40 mg total) by mouth daily.   No current facility-administered medications on file prior to visit.      Allergies:  Allergies  Allergen Reactions  . 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 Clinoril [Sulindac] Rash     Medical History:  Past Medical History:  Diagnosis Date  . Allergy   . Anxiety   . Depression   . Esophageal stricture   . GERD (gastroesophageal reflux disease)   . Hyperlipemia   .  Hypertension   . Unspecified vitamin D deficiency    Family history- Reviewed and unchanged Social history- Reviewed and unchanged   Review of Systems:  Review of Systems  Constitutional: Negative for malaise/fatigue and weight loss.  HENT: Negative for congestion, hearing loss, sore throat and tinnitus.   Eyes: Negative for blurred vision and double vision.  Respiratory: Negative for cough, sputum production, shortness of breath and wheezing.   Cardiovascular: Negative for chest pain, palpitations, orthopnea, claudication and leg swelling.  Gastrointestinal: Negative for abdominal pain, blood in stool, constipation, diarrhea, heartburn, melena, nausea and vomiting.  Genitourinary: Negative.   Musculoskeletal: Negative for joint pain and myalgias.  Skin: Negative for rash.  Neurological: Negative for dizziness, tingling, sensory change, weakness and headaches.  Endo/Heme/Allergies: Positive for environmental allergies. Negative for polydipsia.  Psychiatric/Behavioral: Negative.   All other systems reviewed and are negative.    Physical Exam: BP 134/82   Pulse 82   Temp (!) 97.3 F (36.3 C)   Ht 5' 2.5" (1.588 m)   Wt 157 lb (71.2 kg)  SpO2 97%   BMI 28.26 kg/m  Wt Readings from Last 3 Encounters:  02/05/19 157 lb (71.2 kg)  10/23/18 163 lb (73.9 kg)  07/12/18 166 lb (75.3 kg)   General Appearance: Well nourished, in no apparent distress. Eyes: PERRLA, EOMs, conjunctiva no swelling or erythema Sinuses: No Frontal/maxillary tenderness ENT/Mouth: Ext aud canals clear, TMs without erythema, bulging. No erythema, swelling, or exudate on post pharynx.  Tonsils not swollen or erythematous. Hearing normal.  Neck: Supple, thyroid normal.  Respiratory: Respiratory effort normal, BS equal bilaterally without rales, rhonchi, wheezing or stridor.  Cardio: RRR with no MRGs. Brisk peripheral pulses without edema.  Abdomen: Soft, + BS.  Non tender, no guarding, rebound, hernias,  masses. Lymphatics: Non tender without lymphadenopathy.  Musculoskeletal: Full ROM, 5/5 strength, Normal gait Skin: Warm, dry without rashes, lesions, ecchymosis.  Neuro: Cranial nerves intact. No cerebellar symptoms.  Psych: Awake and oriented X 3, normal affect, Insight and Judgment appropriate.    Dan Maker, NP 11:20 AM [facility]

## 2019-02-05 ENCOUNTER — Ambulatory Visit: Payer: Managed Care, Other (non HMO) | Admitting: Adult Health

## 2019-02-05 ENCOUNTER — Encounter: Payer: Self-pay | Admitting: Adult Health

## 2019-02-05 VITALS — BP 134/82 | HR 82 | Temp 97.3°F | Ht 62.5 in | Wt 157.0 lb

## 2019-02-05 DIAGNOSIS — E663 Overweight: Secondary | ICD-10-CM | POA: Diagnosis not present

## 2019-02-05 DIAGNOSIS — Z79899 Other long term (current) drug therapy: Secondary | ICD-10-CM

## 2019-02-05 DIAGNOSIS — E782 Mixed hyperlipidemia: Secondary | ICD-10-CM

## 2019-02-05 DIAGNOSIS — I10 Essential (primary) hypertension: Secondary | ICD-10-CM

## 2019-02-05 DIAGNOSIS — R7303 Prediabetes: Secondary | ICD-10-CM | POA: Diagnosis not present

## 2019-02-05 DIAGNOSIS — E559 Vitamin D deficiency, unspecified: Secondary | ICD-10-CM | POA: Diagnosis not present

## 2019-02-05 DIAGNOSIS — F418 Other specified anxiety disorders: Secondary | ICD-10-CM

## 2019-02-05 MED ORDER — HYDROCHLOROTHIAZIDE 25 MG PO TABS: 25.0000 mg | ORAL_TABLET | Freq: Every day | ORAL | 1 refills | Status: DC

## 2019-02-05 NOTE — Patient Instructions (Addendum)
Goals    . HEMOGLOBIN A1C < 5.7    . LDL CALC < 100    . Weight (lb) < 150 lb (68 kg)       Think about getting back on allergy medication -   Check all meds at home with discharge list and make sure everything is correct/up to date  Keep up the excellent work with weight loss!!    Aim for 7+ servings of fruits and vegetables daily  65-80+ fluid ounces of water or unsweet tea for healthy kidneys  Limit to max 1 drink of alcohol per day; avoid smoking/tobacco  Limit animal fats in diet for cholesterol and heart health - choose grass fed whenever available  Avoid highly processed foods, and foods high in saturated/trans fats  Aim for low stress - take time to unwind and care for your mental health  Aim for 150 min of moderate intensity exercise weekly for heart health, and weights twice weekly for bone health  Aim for 7-9 hours of sleep daily       When it comes to diets, agreement about the perfect plan isn't easy to find, even among the experts. Experts at the [facility] [employer] developed an idea known as the Healthy Eating Plate. Just imagine a plate divided into logical, healthy portions.  The emphasis is on diet quality:  Load up on vegetables and fruits - one-half of your plate: Aim for color and variety, and remember that potatoes don't count.  Go for whole grains - one-quarter of your plate: Whole wheat, barley, wheat berries, quinoa, oats, brown rice, and foods made with them. If you want pasta, go with whole wheat pasta.  Protein power - one-quarter of your plate: Fish, chicken, beans, and nuts are all healthy, versatile protein sources. Limit red meat.  The diet, however, does go beyond the plate, offering a few other suggestions.  Use healthy plant oils, such as olive, canola, soy, corn, sunflower and peanut. Check the labels, and avoid partially hydrogenated oil, which have unhealthy trans fats.  If you're thirsty, drink water. Coffee and tea  are good in moderation, but skip sugary drinks and limit milk and dairy products to one or two daily servings.  The type of carbohydrate in the diet is more important than the amount. Some sources of carbohydrates, such as vegetables, fruits, whole grains, and beans-are healthier than others.  Finally, stay active.

## 2019-02-06 ENCOUNTER — Encounter: Payer: Self-pay | Admitting: Adult Health

## 2019-02-06 DIAGNOSIS — R945 Abnormal results of liver function studies: Secondary | ICD-10-CM

## 2019-02-06 LAB — CBC WITH DIFFERENTIAL/PLATELET
Absolute Monocytes: 340 cells/uL (ref 200–950)
Basophils Absolute: 20 cells/uL (ref 0–200)
Basophils Relative: 0.4 %
Eosinophils Absolute: 100 cells/uL (ref 15–500)
Eosinophils Relative: 2 %
HCT: 45.4 % — ABNORMAL HIGH (ref 35.0–45.0)
Hemoglobin: 15.5 g/dL (ref 11.7–15.5)
Lymphs Abs: 2350 cells/uL (ref 850–3900)
MCH: 32.2 pg (ref 27.0–33.0)
MCHC: 34.1 g/dL (ref 32.0–36.0)
MCV: 94.2 fL (ref 80.0–100.0)
MPV: 10.6 fL (ref 7.5–12.5)
Monocytes Relative: 6.8 %
Neutro Abs: 2190 cells/uL (ref 1500–7800)
Neutrophils Relative %: 43.8 %
Platelets: 182 10*3/uL (ref 140–400)
RBC: 4.82 10*6/uL (ref 3.80–5.10)
RDW: 12.3 % (ref 11.0–15.0)
TOTAL LYMPHOCYTE: 47 %
WBC: 5 10*3/uL (ref 3.8–10.8)

## 2019-02-06 LAB — COMPLETE METABOLIC PANEL WITH GFR
AG Ratio: 1.6 (calc) (ref 1.0–2.5)
ALT: 42 U/L — ABNORMAL HIGH (ref 6–29)
AST: 33 U/L (ref 10–35)
Albumin: 4.6 g/dL (ref 3.6–5.1)
Alkaline phosphatase (APISO): 91 U/L (ref 37–153)
BUN/Creatinine Ratio: 11 (calc) (ref 6–22)
BUN: 11 mg/dL (ref 7–25)
CO2: 29 mmol/L (ref 20–32)
CREATININE: 1.01 mg/dL — AB (ref 0.50–0.99)
Calcium: 10.5 mg/dL — ABNORMAL HIGH (ref 8.6–10.4)
Chloride: 104 mmol/L (ref 98–110)
GFR, Est African American: 70 mL/min/{1.73_m2} (ref >=60)
GFR, Est Non African American: 60 mL/min/{1.73_m2} (ref >=60)
Globulin: 2.8 g/dL (calc) (ref 1.9–3.7)
Glucose, Bld: 91 mg/dL (ref 65–99)
Potassium: 4.3 mmol/L (ref 3.5–5.3)
Sodium: 145 mmol/L (ref 135–146)
Total Bilirubin: 0.2 mg/dL (ref 0.2–1.2)
Total Protein: 7.4 g/dL (ref 6.1–8.1)

## 2019-02-06 LAB — LIPID PANEL
CHOL/HDL RATIO: 2.7 (calc) (ref <5.0)
Cholesterol: 162 mg/dL (ref <200)
HDL: 60 mg/dL (ref >=50)
LDL Cholesterol (Calc): 77 mg/dL (calc)
Non-HDL Cholesterol (Calc): 102 mg/dL (calc) (ref <130)
Triglycerides: 157 mg/dL — ABNORMAL HIGH (ref <150)

## 2019-02-06 LAB — HEMOGLOBIN A1C
Hgb A1c MFr Bld: 5.8 % of total Hgb — ABNORMAL HIGH (ref <5.7)
Mean Plasma Glucose: 120 (calc)
eAG (mmol/L): 6.6 (calc)

## 2019-02-06 LAB — TSH: TSH: 1.38 mIU/L (ref 0.40–4.50)

## 2019-02-10 ENCOUNTER — Other Ambulatory Visit: Payer: Self-pay | Admitting: Adult Health

## 2019-02-10 ENCOUNTER — Ambulatory Visit: Payer: Self-pay | Admitting: Adult Health

## 2019-03-28 ENCOUNTER — Other Ambulatory Visit: Payer: Self-pay | Admitting: Adult Health

## 2019-03-28 ENCOUNTER — Other Ambulatory Visit: Payer: Self-pay | Admitting: Internal Medicine

## 2019-03-31 ENCOUNTER — Other Ambulatory Visit: Payer: Self-pay | Admitting: Internal Medicine

## 2019-04-03 ENCOUNTER — Other Ambulatory Visit: Payer: Self-pay | Admitting: Internal Medicine

## 2019-04-30 ENCOUNTER — Other Ambulatory Visit: Payer: Self-pay | Admitting: Adult Health

## 2019-05-07 ENCOUNTER — Other Ambulatory Visit: Payer: Self-pay

## 2019-05-07 DIAGNOSIS — J069 Acute upper respiratory infection, unspecified: Secondary | ICD-10-CM

## 2019-05-07 MED ORDER — PROMETHAZINE-DM 6.25-15 MG/5ML PO SYRP: ORAL_SOLUTION | ORAL | 0 refills | Status: DC

## 2019-05-07 NOTE — Progress Notes (Deleted)
FOLLOW UP  Assessment and Plan:   Hypertension Well controlled with current medications  Monitor blood pressure at home; patient to call if consistently greater than 130/80 Continue DASH diet.   Reminder to go to the ER if any CP, SOB, nausea, dizziness, severe HA, changes vision/speech, left arm numbness and tingling and jaw pain.  Cholesterol Currently at goal; continue rosuvastatin 40 mg daily  Continue low cholesterol diet and exercise.  Check lipid panel.   Prediabetes Continue diet and exercise.  Perform daily foot/skin check, notify office of any concerning changes.  Check A1C  Overweight with comorbidities Commended on weight loss progress thus far Long discussion about weight loss, diet, and exercise Recommended diet heavy in fruits and veggies and low in animal meats, cheeses, and dairy products, appropriate calorie intake Discussed ideal weight for height and initial weight goal (<150lb) Will follow up in 3 months  Vitamin D Def Near goal at last check continue supplementation to maintain goal of 60-100 Defer Vit D level to next CPE  Depression/anxiety Continue medications, lexapro helpful Lifestyle discussed: diet/exerise, sleep hygiene, stress management, hydration  GERD Well controlled by famotidine PRN  Discussed diet, avoiding triggers and other lifestyle changes  LFT elevation Check labs, hepatitis panel as never been screened, avoid tylenol, alcohol, weight loss advised.   Continue diet and meds as discussed. Further disposition pending results of labs. Discussed med's effects and SE's.   Over 30 minutes of exam, counseling, chart review, and critical decision making was performed.   Future Appointments  Date Time Provider Department Center  05/08/2019 10:45 AM Judd Gaudier, NP [facility] None  08/11/2019  9:00 AM Judd Gaudier, NP [facility] None     ----------------------------------------------------------------------------------------------------------------------  HPI 63 y.o. female  presents for 3 month follow up on hypertension, cholesterol, prediabetes, weight and vitamin D deficiency.   she has a diagnosis of depression/anxiety and is currently on lexapro 20 mg daily, reports symptoms are well controlled on current regimen.   She does not have formal diagnosis of asthma but is prescribed symbicort for reports of wheezing with seasonal changes which she takes intermittently as needed and finds very beneficial.   She is on famotidine PRN for gerd, has been on PPI previously, denies current reflux symptoms, burning, hoarseness.    BMI is There is no height or weight on file to calculate BMI., she is working on diet, eating fruit, salads, greens, exercise is limited due to busy season at work. Goal weight is 150 lb.  Wt Readings from Last 3 Encounters:  02/05/19 157 lb (71.2 kg)  10/23/18 163 lb (73.9 kg)  07/12/18 166 lb (75.3 kg)   Her blood pressure has been controlled at home, today their BP is    She does not workout. She denies chest pain, shortness of breath, dizziness.   She is on cholesterol medication Rosuvastatin 40 mg daily and denies myalgias. Her cholesterol is at goal. The cholesterol last visit was:   Lab Results  Component Value Date   CHOL 162 02/05/2019   HDL 60 02/05/2019   LDLCALC 77 02/05/2019   TRIG 157 (H) 02/05/2019   CHOLHDL 2.7 02/05/2019    She has been working on diet and exercise for prediabetes, and denies increased appetite, nausea, paresthesia of the feet, polydipsia, polyuria and visual disturbances. Last A1C in the office was:  Lab Results  Component Value Date   HGBA1C 5.8 (H) 02/05/2019   Patient is on Vitamin D supplement.   Lab Results  Component Value  Date   VD25OH 56 07/12/2018     Lab Results  Component Value Date   GFRAA 70 02/05/2019   She had mild LFT elevation last  visit; has never been screened for hepatitis *** Lab Results  Component Value Date   ALT 42 (H) 02/05/2019   AST 33 02/05/2019   ALKPHOS 81 06/28/2017   BILITOT 0.2 02/05/2019     Current Medications:  Current Outpatient Medications on File Prior to Visit  Medication Sig  . albuterol (VENTOLIN HFA) 108 (90 Base) MCG/ACT inhaler Inhale 2 puffs into the lungs every 4 (four) hours as needed for wheezing or shortness of breath.  Marland Kitchen. amLODipine (NORVASC) 5 MG tablet Take 1 tablet Daily for BP  . aspirin 81 MG tablet Take 81 mg by mouth daily.  Marland Kitchen. azelastine (ASTELIN) 0.1 % nasal spray PLACE 2 SPRAYS INTO BOTH NOSTRILS 2 (TWO) TIMES DAILY. USE IN EACH NOSTRIL AS DIRECTED  . Biotin 300 MCG TABS Take 1 tablet by mouth at bedtime.   . CHOLECALCIFEROL PO Take 10,000 Units by mouth daily.  . cloNIDine (CATAPRES) 0.1 MG tablet TAKE ONE TABLET BY MOUTH DAILY  . escitalopram (LEXAPRO) 20 MG tablet TAKE ONE TABLET BY MOUTH DAILY  . famotidine (PEPCID) 40 MG tablet Take 1 tablet at bedtime for Acid Reflux  . fluticasone (FLONASE) 50 MCG/ACT nasal spray SPRAY TWO SPRAYS IN EACH NOSTRIL ONCE DAILY  . hydrochlorothiazide (HYDRODIURIL) 25 MG tablet Take 1 tablet (25 mg total) by mouth daily.  Marland Kitchen. latanoprost (XALATAN) 0.005 % ophthalmic solution Place 1 drop into both eyes at bedtime.  . Magnesium 250 MG TABS Take 1 tablet by mouth 2 (two) times daily.   . meloxicam (MOBIC) 15 MG tablet Take one daily with food for 2 weeks, can take with tylenol, can not take with aleve, iburpofen, then as needed daily for pain  . potassium chloride (K-DUR) 10 MEQ tablet TAKE ONE TABLET BY MOUTH DAILY  . promethazine-dextromethorphan (PROMETHAZINE-DM) 6.25-15 MG/5ML syrup TAKE 5 MLS BY MOUTH FOUR TIMES A DAY AS NEEDED FOR COUGH  . rosuvastatin (CRESTOR) 40 MG tablet Take 1 tablet Daily for Cholesterol   No current facility-administered medications on file prior to visit.      Allergies:  Allergies  Allergen Reactions  .  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 Clinoril [Sulindac] Rash     Medical History:  Past Medical History:  Diagnosis Date  . Allergy   . Anxiety   . Depression   . Esophageal stricture   . GERD (gastroesophageal reflux disease)   . Hyperlipemia   . Hypertension   . Unspecified vitamin D deficiency    Family history- Reviewed and unchanged Social history- Reviewed and unchanged   Review of Systems:  Review of Systems  Constitutional: Negative for malaise/fatigue and weight loss.  HENT: Negative for congestion, hearing loss, sore throat and tinnitus.   Eyes: Negative for blurred vision and double vision.  Respiratory: Negative for cough, sputum production, shortness of breath and wheezing.   Cardiovascular: Negative for chest pain, palpitations, orthopnea, claudication and leg swelling.  Gastrointestinal: Negative for abdominal pain, blood in stool, constipation, diarrhea, heartburn, melena, nausea and vomiting.  Genitourinary: Negative.   Musculoskeletal: Negative for joint pain and myalgias.  Skin: Negative for rash.  Neurological: Negative for dizziness, tingling, sensory change, weakness and headaches.  Endo/Heme/Allergies: Positive for environmental allergies. Negative for polydipsia.  Psychiatric/Behavioral: Negative.   All other  systems reviewed and are negative.    Physical Exam: There were no vitals taken for this visit. Wt Readings from Last 3 Encounters:  02/05/19 157 lb (71.2 kg)  10/23/18 163 lb (73.9 kg)  07/12/18 166 lb (75.3 kg)   General Appearance: Well nourished, in no apparent distress. Eyes: PERRLA, EOMs, conjunctiva no swelling or erythema Sinuses: No Frontal/maxillary tenderness ENT/Mouth: Ext aud canals clear, TMs without erythema, bulging. No erythema, swelling, or exudate on post pharynx.  Tonsils not swollen or erythematous. Hearing normal.  Neck: Supple, thyroid normal.  Respiratory: Respiratory  effort normal, BS equal bilaterally without rales, rhonchi, wheezing or stridor.  Cardio: RRR with no MRGs. Brisk peripheral pulses without edema.  Abdomen: Soft, + BS.  Non tender, no guarding, rebound, hernias, masses. Lymphatics: Non tender without lymphadenopathy.  Musculoskeletal: Full ROM, 5/5 strength, Normal gait Skin: Warm, dry without rashes, lesions, ecchymosis.  Neuro: Cranial nerves intact. No cerebellar symptoms.  Psych: Awake and oriented X 3, normal affect, Insight and Judgment appropriate.    Dan Maker, NP 12:14 PM [facility]

## 2019-05-08 ENCOUNTER — Ambulatory Visit: Payer: Self-pay | Admitting: Adult Health

## 2019-05-11 ENCOUNTER — Other Ambulatory Visit: Payer: Self-pay | Admitting: Adult Health

## 2019-05-26 NOTE — Progress Notes (Signed)
FOLLOW UP  Assessment and Plan:   Hypertension Well controlled with current medications  Monitor blood pressure at home; patient to call if consistently greater than 130/80 Continue DASH diet.   Reminder to go to the ER if any CP, SOB, nausea, dizziness, severe HA, changes vision/speech, left arm numbness and tingling and jaw pain.  Cholesterol Currently at goal; continue rosuvastatin 40 mg daily  Continue low cholesterol diet and exercise.  Check lipid panel.   Prediabetes Continue diet and exercise.  Perform daily foot/skin check, notify office of any concerning changes.  Check A1C  Overweight with comorbidities Commended on weight loss progress thus far Long discussion about weight loss, diet, and exercise Recommended diet heavy in fruits and veggies and low in animal meats, cheeses, and dairy products, appropriate calorie intake Discussed ideal weight for height and initial weight goal (<150lb) Start weighing weekly, cut down on snacking at work  Will follow up in 3 months  Vitamin D Def Near goal at last check continue supplementation to maintain goal of 60-100 Defer Vit D level to next CPE  Depression/anxiety Continue medications, lexapro helpful Lifestyle discussed: diet/exerise, sleep hygiene, stress management, hydration  GERD Well controlled by famotidine PRN  Discussed diet, avoiding triggers and other lifestyle changes  LFT elevation Check labs, hepatitis panel as never been screened but due to lab costs she requests to defer at this time, avoid tylenol, alcohol, weight loss advised.   Cough Allergies vs breakthrough silent reflux; take allergy meds consistently; if no improvement get OTC PPI and take x 2 weeks - call to report progress  Continue diet and meds as discussed. Further disposition pending results of labs. Discussed med's effects and SE's.   Over 30 minutes of exam, counseling, chart review, and critical decision making was performed.    Future Appointments  Date Time Provider Department Center  08/11/2019  9:00 AM Judd Gaudierorbett, Binyamin Nelis, NP [facility] None    ----------------------------------------------------------------------------------------------------------------------  HPI 63 y.o. female  presents for 3 month follow up on hypertension, cholesterol, prediabetes, weight and vitamin D deficiency.   she has a diagnosis of depression/anxiety and is currently on lexapro 20 mg daily, reports symptoms are well controlled on current regimen.   She does not have formal diagnosis of asthma but is prescribed symbicort for reports of wheezing with seasonal changes which she takes intermittently as needed and finds very beneficial. She does endorse mild intermittent post nasal drip and nonproductive cough since weather changes. She is taking zyrtec daily, has astalin and flonase but admits not using. Advised to restart.    She is on famotidine PRN for gerd, has been on PPI previously, denies current reflux symptoms, burning, hoarseness.     BMI is Body mass index is 29.16 kg/m., she is working on diet though not as good as she'd like, eating fruit, salads, greens, exercise is limited due to busy season at work. Goal weight is 150 lb.  Wt Readings from Last 3 Encounters:  05/28/19 162 lb (73.5 kg)  02/05/19 157 lb (71.2 kg)  10/23/18 163 lb (73.9 kg)   Her blood pressure has been controlled at home, today their BP is BP: 140/86  She does not workout. She denies chest pain, shortness of breath, dizziness.   She is on cholesterol medication Rosuvastatin 40 mg daily and denies myalgias. Her cholesterol is at goal. The cholesterol last visit was:   Lab Results  Component Value Date   CHOL 162 02/05/2019   HDL 60 02/05/2019   LDLCALC  77 02/05/2019   TRIG 157 (H) 02/05/2019   CHOLHDL 2.7 02/05/2019    She has been working on diet and exercise for prediabetes, and denies increased appetite, nausea, paresthesia of the feet,  polydipsia, polyuria and visual disturbances. Last A1C in the office was:  Lab Results  Component Value Date   HGBA1C 5.8 (H) 02/05/2019   Patient is on Vitamin D supplement.   Lab Results  Component Value Date   VD25OH 56 07/12/2018     Lab Results  Component Value Date   GFRAA 70 02/05/2019   She had mild LFT elevation last visit; has never been screened for hepatitis Lab Results  Component Value Date   ALT 42 (H) 02/05/2019   AST 33 02/05/2019   ALKPHOS 81 06/28/2017   BILITOT 0.2 02/05/2019     Current Medications:  Current Outpatient Medications on File Prior to Visit  Medication Sig  . albuterol (VENTOLIN HFA) 108 (90 Base) MCG/ACT inhaler Inhale 2 puffs into the lungs every 4 (four) hours as needed for wheezing or shortness of breath.  Marland Kitchen amLODipine (NORVASC) 5 MG tablet Take 1 tablet Daily for BP  . aspirin 81 MG tablet Take 81 mg by mouth daily.  Marland Kitchen azelastine (ASTELIN) 0.1 % nasal spray PLACE 2 SPRAYS INTO BOTH NOSTRILS 2 (TWO) TIMES DAILY. USE IN EACH NOSTRIL AS DIRECTED  . Biotin 300 MCG TABS Take 1 tablet by mouth at bedtime.   . CHOLECALCIFEROL PO Take 10,000 Units by mouth daily.  . cloNIDine (CATAPRES) 0.1 MG tablet TAKE ONE TABLET BY MOUTH DAILY  . escitalopram (LEXAPRO) 20 MG tablet Take 1 tablet Daily for Mood  . famotidine (PEPCID) 40 MG tablet Take 1 tablet at bedtime for Acid Reflux  . fluticasone (FLONASE) 50 MCG/ACT nasal spray SPRAY TWO SPRAYS IN EACH NOSTRIL ONCE DAILY  . hydrochlorothiazide (HYDRODIURIL) 25 MG tablet Take 1 tablet (25 mg total) by mouth daily.  Marland Kitchen latanoprost (XALATAN) 0.005 % ophthalmic solution Place 1 drop into both eyes at bedtime.  . Magnesium 250 MG TABS Take 1 tablet by mouth 2 (two) times daily.   . meloxicam (MOBIC) 15 MG tablet Take one daily with food for 2 weeks, can take with tylenol, can not take with aleve, iburpofen, then as needed daily for pain  . potassium chloride (K-DUR) 10 MEQ tablet TAKE ONE TABLET BY MOUTH DAILY   . promethazine-dextromethorphan (PROMETHAZINE-DM) 6.25-15 MG/5ML syrup TAKE 5 MLS BY MOUTH FOUR TIMES A DAY AS NEEDED FOR COUGH  . rosuvastatin (CRESTOR) 40 MG tablet Take 1 tablet Daily for Cholesterol   No current facility-administered medications on file prior to visit.      Allergies:  Allergies  Allergen Reactions  . 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 Clinoril [Sulindac] Rash     Medical History:  Past Medical History:  Diagnosis Date  . Allergy   . Anxiety   . Depression   . Esophageal stricture   . GERD (gastroesophageal reflux disease)   . Hyperlipemia   . Hypertension   . Unspecified vitamin D deficiency    Family history- Reviewed and unchanged Social history- Reviewed and unchanged   Review of Systems:  Review of Systems  Constitutional: Negative for malaise/fatigue and weight loss.  HENT: Negative for congestion, hearing loss, sore throat and tinnitus.   Eyes: Negative for blurred vision and double vision.  Respiratory: Negative for cough, sputum production, shortness of breath and wheezing.  Cardiovascular: Negative for chest pain, palpitations, orthopnea, claudication and leg swelling.  Gastrointestinal: Negative for abdominal pain, blood in stool, constipation, diarrhea, heartburn, melena, nausea and vomiting.  Genitourinary: Negative.   Musculoskeletal: Negative for joint pain and myalgias.  Skin: Negative for rash.  Neurological: Negative for dizziness, tingling, sensory change, weakness and headaches.  Endo/Heme/Allergies: Positive for environmental allergies. Negative for polydipsia.  Psychiatric/Behavioral: Negative.   All other systems reviewed and are negative.    Physical Exam: BP 140/86   Pulse 87   Temp (!) 97.3 F (36.3 C)   Ht 5' 2.5" (1.588 m)   Wt 162 lb (73.5 kg)   SpO2 98%   BMI 29.16 kg/m  Wt Readings from Last 3 Encounters:  05/28/19 162 lb (73.5 kg)  02/05/19 157 lb (71.2 kg)   10/23/18 163 lb (73.9 kg)   General Appearance: Well nourished, in no apparent distress. Eyes: PERRLA, EOMs, conjunctiva no swelling or erythema Sinuses: No Frontal/maxillary tenderness ENT/Mouth: Ext aud canals clear, TMs without erythema, bulging. No erythema, swelling, or exudate on post pharynx.  Tonsils not swollen or erythematous. Hearing normal.  Neck: Supple, thyroid normal.  Respiratory: Respiratory effort normal, BS equal bilaterally without rales, rhonchi, wheezing or stridor.  Cardio: RRR with no MRGs. Brisk peripheral pulses without edema.  Abdomen: Soft, + BS.  Non tender, no guarding, rebound, hernias, masses. Lymphatics: Non tender without lymphadenopathy.  Musculoskeletal: Full ROM, 5/5 strength, Normal gait Skin: Warm, dry without rashes, lesions, ecchymosis.  Neuro: Cranial nerves intact. No cerebellar symptoms.  Psych: Awake and oriented X 3, normal affect, Insight and Judgment appropriate.    Dan MakerAshley C Dao Mearns, NP 11:44 AM [facility]

## 2019-05-28 ENCOUNTER — Other Ambulatory Visit: Payer: Self-pay

## 2019-05-28 ENCOUNTER — Ambulatory Visit: Payer: Managed Care, Other (non HMO) | Admitting: Adult Health

## 2019-05-28 ENCOUNTER — Encounter: Payer: Self-pay | Admitting: Adult Health

## 2019-05-28 VITALS — BP 140/86 | HR 87 | Temp 97.3°F | Ht 62.5 in | Wt 162.0 lb

## 2019-05-28 DIAGNOSIS — E782 Mixed hyperlipidemia: Secondary | ICD-10-CM

## 2019-05-28 DIAGNOSIS — E663 Overweight: Secondary | ICD-10-CM

## 2019-05-28 DIAGNOSIS — Z79899 Other long term (current) drug therapy: Secondary | ICD-10-CM

## 2019-05-28 DIAGNOSIS — K219 Gastro-esophageal reflux disease without esophagitis: Secondary | ICD-10-CM

## 2019-05-28 DIAGNOSIS — R7309 Other abnormal glucose: Secondary | ICD-10-CM | POA: Diagnosis not present

## 2019-05-28 DIAGNOSIS — I10 Essential (primary) hypertension: Secondary | ICD-10-CM | POA: Diagnosis not present

## 2019-05-28 DIAGNOSIS — R7989 Other specified abnormal findings of blood chemistry: Secondary | ICD-10-CM

## 2019-05-28 DIAGNOSIS — E559 Vitamin D deficiency, unspecified: Secondary | ICD-10-CM | POA: Diagnosis not present

## 2019-05-28 DIAGNOSIS — R945 Abnormal results of liver function studies: Secondary | ICD-10-CM

## 2019-05-28 DIAGNOSIS — F418 Other specified anxiety disorders: Secondary | ICD-10-CM

## 2019-05-28 MED ORDER — AZELASTINE HCL 0.1 % NA SOLN: NASAL | 1 refills | Status: DC

## 2019-05-28 NOTE — Patient Instructions (Addendum)
Goals    . HEMOGLOBIN A1C < 5.7    . LDL CALC < 100    . Weight (lb) < 150 lb (68 kg)      Weigh weekly and keep log for accountability     Know what a healthy weight is for you (roughly BMI <25) and aim to maintain this  Aim for 7+ servings of fruits and vegetables daily  65-80+ fluid ounces of water or unsweet tea for healthy kidneys  Limit to max 1 drink of alcohol per day; avoid smoking/tobacco  Limit animal fats in diet for cholesterol and heart health - choose grass fed whenever available  Avoid highly processed foods, and foods high in saturated/trans fats  Aim for low stress - take time to unwind and care for your mental health  Aim for 150 min of moderate intensity exercise weekly for heart health, and weights twice weekly for bone health  Aim for 7-9 hours of sleep daily  Pick up nexium or prilosec over the counter and take in the morning daily for 2 weeks and see how symptoms do      Generally a cough is either coming from above or from below- so we will treat this OR it can be from irritation/viral cough  To treat the nasal drip: Get on the chlorphenirmine every 6 hours- This medication can make you sleepy but helps with nasal drip- get from over the counter.   Can do a steroid nasal spary 1-2 sparys at night each nostril. Remember to spray each nostril twice towards the outer part of your eye.  Do not sniff but instead pinch your nose and tilt your head back to help the medicine get into your sinuses.  The best time to do this is at bedtime. Stop if you get blurred vision or nose bleeds.   To treat the reflux Will send in prilosec 40 mg to take once in the morning and take prevacid from over the counter at night for 2 weeks- then stop the prilosec and continue the pravacid or famotadine  To stop irritation: Need to STOP the cough Do sugar free candy Do the tessalon drops VOICE REST is VERY important  If not better in 2 weeks will refer to ENT  Go to  the ER or call the office if you get any chest pain, shortness of breath, severe  headache, leg swelling.    Common causes of cough OR hoarseness OR sore throat:   Allergies, Viral Infections, Acid Reflux and Bacterial Infections.    Allergies and viral infections cause a cough OR sore throat by post nasal drip and are often worse at night, can also have sneezing, lower grade fevers, clear/yellow mucus. This is best treated with allergy medications or nasal sprays.  Please get on allegra for 1-2 weeks The strongest is allegra or fexafinadine  Cheapest at [employer], [employer], [employer]   Bacterial infections are more severe than allergies or viral infections with fever, teeth pain, fatigue. This can be treated with prednisone and the same over the counter medication and after 7 days can be treated with an antibiotic.   Silent reflux/GERD can cause a cough OR sore throat OR hoarseness WITHOUT heart burn because the esophagus that goes to the stomach and trachea that goes to the lungs are very close and when you lay down the acid can irritate your throat and lungs. This can cause hoarseness, cough, and wheezing. Please stop any alcohol or anti-inflammatories like aleve/advil/ibuprofen and start an  over the counter Prilosec or omeprazole 1-2 times daily 75mins before food for 2 weeks, then switch to over the counter zantac/ratinidine or pepcid/famotadine once at night for 2 weeks.    sometimes irritation causes more irritation. Try voice rest, use sugar free cough drops to prevent coughing, and try to stop clearing your throat.   If you ever have a cough that does not go away after trying these things please make a follow up visit for further evaluation or we can refer you to a specialist. Or if you ever have shortness of breath or chest pain go to the ER.    Silent reflux: Not all heartburn burns...Marland KitchenMarland KitchenMarland Kitchen  What is LPR? Laryngopharyngeal reflux (LPR) or silent reflux is a condition in which acid that is made  in the stomach travels up the esophagus (swallowing tube) and gets to the throat. Not everyone with reflux has a lot of heartburn or indigestion. In fact, many people with LPR never have heartburn. This is why LPR is called SILENT REFLUX, and the terms "Silent reflux" and "LPR" are often used interchangeably. Because LPR is silent, it is sometimes difficult to diagnose.  How can you tell if you have LPR?  Marland Kitchen Chronic hoarseness- Some people have hoarseness that comes and goes . throat clearing  . Cough . It can cause shortness of breath and cause asthma like symptoms. Marland Kitchen a feeling of a lump in the throat  . difficulty swallowing . a problem with too much nose and throat drainage.  . Some people will feel their esophagus spasm which feels like their heart beating hard and fast, this will usually be after a meal, at rest, or lying down at night.    How do I treat this? Treatment for LPR should be individualized, and your doctor will suggest the best treatment for you. Generally there are several treatments for LPR: . changing habits and diet to reduce reflux,  . medications to reduce stomach acid, and  . surgery to prevent reflux. Most people with LPR need to modify how and when they eat, as well as take some medication, to get well. Sometimes, nonprescription liquid antacids, such as Maalox, Gelucil and Mylanta are recommended. When used, these antacids should be taken four times each day - one tablespoon one hour after each meal and before bedtime. Dietary and lifestyle changes alone are not often enough to control LPR - medications that reduce stomach acid are also usually needed. These must be prescribed by our doctor.   TIPS FOR REDUCING REFLUX AND LPR Control your LIFE-STYLE and your DIET! Marland Kitchen If you use tobacco, QUIT.  Marland Kitchen Smoking makes you reflux. After every cigarette you have some LPR.  . Don't wear clothing that is too tight, especially around the waist (trousers, corsets, belts).   . Do not lie down just after eating...in fact, do not eat within three hours of bedtime.  . You should be on a low-fat diet.  . Limit your intake of red meat.  . Limit your intake of butter.  Marland Kitchen Avoid fried foods.  . Avoid chocolate  . Avoid cheese.  Marland Kitchen Avoid eggs. Marland Kitchen Specifically avoid caffeine (especially coffee and tea), soda pop (especially cola) and mints.  . Avoid alcoholic beverages, particularly in the evening.

## 2019-05-29 ENCOUNTER — Other Ambulatory Visit: Payer: Self-pay | Admitting: Adult Health

## 2019-05-29 DIAGNOSIS — E876 Hypokalemia: Secondary | ICD-10-CM

## 2019-05-29 LAB — CBC WITH DIFFERENTIAL/PLATELET
Absolute Monocytes: 221 cells/uL (ref 200–950)
Basophils Absolute: 19 cells/uL (ref 0–200)
Basophils Relative: 0.4 %
Eosinophils Absolute: 52 cells/uL (ref 15–500)
Eosinophils Relative: 1.1 %
HCT: 43.5 % (ref 35.0–45.0)
Hemoglobin: 15.1 g/dL (ref 11.7–15.5)
Lymphs Abs: 1744 cells/uL (ref 850–3900)
MCH: 32.5 pg (ref 27.0–33.0)
MCHC: 34.7 g/dL (ref 32.0–36.0)
MCV: 93.5 fL (ref 80.0–100.0)
MPV: 10.5 fL (ref 7.5–12.5)
Monocytes Relative: 4.7 %
Neutro Abs: 2665 cells/uL (ref 1500–7800)
Neutrophils Relative %: 56.7 %
Platelets: 176 10*3/uL (ref 140–400)
RBC: 4.65 10*6/uL (ref 3.80–5.10)
RDW: 12.4 % (ref 11.0–15.0)
Total Lymphocyte: 37.1 %
WBC: 4.7 10*3/uL (ref 3.8–10.8)

## 2019-05-29 LAB — HEMOGLOBIN A1C
Hgb A1c MFr Bld: 6 % of total Hgb — ABNORMAL HIGH (ref <5.7)
Mean Plasma Glucose: 126 (calc)
eAG (mmol/L): 7 (calc)

## 2019-05-29 LAB — LIPID PANEL
Cholesterol: 159 mg/dL (ref <200)
HDL: 54 mg/dL (ref >=50)
LDL Cholesterol (Calc): 80 mg/dL (calc)
Non-HDL Cholesterol (Calc): 105 mg/dL (calc) (ref <130)
Total CHOL/HDL Ratio: 2.9 (calc) (ref <5.0)
Triglycerides: 156 mg/dL — ABNORMAL HIGH (ref <150)

## 2019-05-29 LAB — COMPLETE METABOLIC PANEL WITH GFR
AG Ratio: 1.9 (calc) (ref 1.0–2.5)
ALT: 39 U/L — ABNORMAL HIGH (ref 6–29)
AST: 30 U/L (ref 10–35)
Albumin: 4.5 g/dL (ref 3.6–5.1)
Alkaline phosphatase (APISO): 91 U/L (ref 37–153)
BUN/Creatinine Ratio: 13 (calc) (ref 6–22)
BUN: 13 mg/dL (ref 7–25)
CO2: 29 mmol/L (ref 20–32)
Calcium: 10 mg/dL (ref 8.6–10.4)
Chloride: 104 mmol/L (ref 98–110)
Creat: 1.01 mg/dL — ABNORMAL HIGH (ref 0.50–0.99)
GFR, Est African American: 70 mL/min/{1.73_m2} (ref >=60)
GFR, Est Non African American: 60 mL/min/{1.73_m2} (ref >=60)
Globulin: 2.4 g/dL (calc) (ref 1.9–3.7)
Glucose, Bld: 155 mg/dL — ABNORMAL HIGH (ref 65–99)
Potassium: 3.3 mmol/L — ABNORMAL LOW (ref 3.5–5.3)
Sodium: 141 mmol/L (ref 135–146)
Total Bilirubin: 0.4 mg/dL (ref 0.2–1.2)
Total Protein: 6.9 g/dL (ref 6.1–8.1)

## 2019-05-29 LAB — MAGNESIUM: Magnesium: 1.8 mg/dL (ref 1.5–2.5)

## 2019-05-29 LAB — TSH: TSH: 0.95 mIU/L (ref 0.40–4.50)

## 2019-06-02 ENCOUNTER — Other Ambulatory Visit: Payer: Self-pay | Admitting: Adult Health

## 2019-06-10 ENCOUNTER — Other Ambulatory Visit: Payer: Self-pay

## 2019-06-10 ENCOUNTER — Other Ambulatory Visit: Payer: Managed Care, Other (non HMO)

## 2019-06-10 DIAGNOSIS — I10 Essential (primary) hypertension: Secondary | ICD-10-CM

## 2019-06-10 DIAGNOSIS — E876 Hypokalemia: Secondary | ICD-10-CM

## 2019-06-11 LAB — BASIC METABOLIC PANEL WITH GFR
BUN: 11 mg/dL (ref 7–25)
CO2: 31 mmol/L (ref 20–32)
Calcium: 9.9 mg/dL (ref 8.6–10.4)
Chloride: 106 mmol/L (ref 98–110)
Creat: 0.83 mg/dL (ref 0.50–0.99)
GFR, Est African American: 88 mL/min/{1.73_m2} (ref >=60)
GFR, Est Non African American: 76 mL/min/{1.73_m2} (ref >=60)
Glucose, Bld: 115 mg/dL — ABNORMAL HIGH (ref 65–99)
Potassium: 3.3 mmol/L — ABNORMAL LOW (ref 3.5–5.3)
Sodium: 145 mmol/L (ref 135–146)

## 2019-06-19 ENCOUNTER — Other Ambulatory Visit: Payer: Self-pay | Admitting: Adult Health

## 2019-06-19 DIAGNOSIS — E876 Hypokalemia: Secondary | ICD-10-CM

## 2019-06-19 MED ORDER — POTASSIUM CHLORIDE ER 10 MEQ PO TBCR: EXTENDED_RELEASE_TABLET | ORAL | 1 refills | Status: DC

## 2019-06-26 ENCOUNTER — Encounter: Payer: Self-pay | Admitting: *Deleted

## 2019-07-05 ENCOUNTER — Other Ambulatory Visit: Payer: Self-pay | Admitting: Internal Medicine

## 2019-07-16 ENCOUNTER — Other Ambulatory Visit: Payer: Self-pay

## 2019-07-16 ENCOUNTER — Other Ambulatory Visit (INDEPENDENT_AMBULATORY_CARE_PROVIDER_SITE_OTHER): Payer: Managed Care, Other (non HMO)

## 2019-07-16 DIAGNOSIS — E876 Hypokalemia: Secondary | ICD-10-CM

## 2019-07-16 LAB — BASIC METABOLIC PANEL WITH GFR
BUN/Creatinine Ratio: 16 (calc) (ref 6–22)
BUN: 16 mg/dL (ref 7–25)
CO2: 31 mmol/L (ref 20–32)
Calcium: 9.4 mg/dL (ref 8.6–10.4)
Chloride: 104 mmol/L (ref 98–110)
Creat: 1 mg/dL — ABNORMAL HIGH (ref 0.50–0.99)
GFR, Est African American: 70 mL/min/{1.73_m2} (ref >=60)
GFR, Est Non African American: 61 mL/min/{1.73_m2} (ref >=60)
Glucose, Bld: 108 mg/dL — ABNORMAL HIGH (ref 65–99)
Potassium: 3.4 mmol/L — ABNORMAL LOW (ref 3.5–5.3)
Sodium: 143 mmol/L (ref 135–146)

## 2019-07-18 ENCOUNTER — Encounter: Payer: Self-pay | Admitting: Adult Health

## 2019-07-21 NOTE — Progress Notes (Signed)
Assessment and Plan:  Kaitlyn [name] was seen today for back pain.  Diagnoses and all orders for this visit:  Lordosis Continue to use back brace while working, monitor posture. Apply ice, 60min at a time -     meloxicam (MOBIC) 15 MG tablet; Take one daily with food for 2 weeks, can take with tylenol, can not take with aleve, iburpofen, then as needed daily for pain Discussed stretching for lower back once pain has decreased, education provided with AVS If no improvement consider Ortho/Neurology referall   Chronic pain of both knees Likely osteoarthritis, never had work up. Discussed continuing to use Aspercreme PRN May apply warm moist heat compresses Discussed stretching of quads and hamstrings  Essential Hypertension Change timing of HCTZ to morning See if this improves night frequency symptoms  GERD Chang timing of pepsid to morning instead of bedtime Monitor for and avoid triggers Monitor symptoms     Further disposition pending results of labs. Discussed med's effects and SE's.   Over 30 minutes of exam, counseling, chart review, and critical decision making was performed.   Future Appointments  Date Time Provider [place]  07/22/2019 10:00 AM Garnet Sierras, NP [facility] None  08/20/2019  9:00 AM Liane Comber, NP [facility] None    ------------------------------------------------------------------------------------------------------------------   HPI 63 y.o.female presents for evaluation of lower back pain and right knee.  Also reports bilateral posterior keen pain that increases with rest.  She reports that the pain is a 9/10 with initiation from chair to walking.   Reports after walking the pain decreases to 2/10.  She reports that there is popping bilateral knee when going up and down the basement stairs.   Low back pain has been chronic and increasing over the past three month.  She works in [employer] and is on her feet for the entire shift.  She  reports she has been wearing back brace for the past three days and seems to help some.   When she goes to bed at night she has pain down her left leg, it is shooting in quality that is constant.   She has tried some ibuprofen 400mg  before bed, that helped some.  She is able to sleep through the night.  Most of the pain is when she is getting up out of the bed.  Once she gets moving it improves and also improves with rest and or sitting.     Past Medical History:  Diagnosis Date  . Allergy   . Anxiety   . Depression   . Esophageal stricture   . GERD (gastroesophageal reflux disease)   . Hyperlipemia   . Hypertension   . Unspecified vitamin D deficiency      Allergies  Allergen Reactions  . 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 Clinoril [Sulindac] Rash    Current Outpatient Medications on File Prior to Visit  Medication Sig  . albuterol (VENTOLIN HFA) 108 (90 Base) MCG/ACT inhaler Inhale 2 puffs into the lungs every 4 (four) hours as needed for wheezing or shortness of breath.  Marland Kitchen amLODipine (NORVASC) 5 MG tablet Take 1 tablet Daily for BP  . aspirin 81 MG tablet Take 81 mg by mouth daily.  Marland Kitchen azelastine (ASTELIN) 0.1 % nasal spray PLACE 2 SPRAYS INTO BOTH NOSTRILS 2 (TWO) TIMES DAILY. USE IN EACH NOSTRIL AS DIRECTED  . Biotin 300 MCG TABS Take 1 tablet by mouth at bedtime.   Marland Kitchen  CHOLECALCIFEROL PO Take 10,000 Units by mouth daily.  . cloNIDine (CATAPRES) 0.1 MG tablet Take 1 tablet Daily for BP  . escitalopram (LEXAPRO) 20 MG tablet Take 1 tablet Daily for Mood  . famotidine (PEPCID) 40 MG tablet Take 1 tablet at bedtime for Acid Reflux  . fluticasone (FLONASE) 50 MCG/ACT nasal spray SPRAY TWO SPRAYS IN EACH NOSTRIL ONCE DAILY  . hydrochlorothiazide (HYDRODIURIL) 25 MG tablet Take 1 tablet (25 mg total) by mouth daily.  Marland Kitchen. latanoprost (XALATAN) 0.005 % ophthalmic solution Place 1 drop into both eyes at bedtime.  . Magnesium 250 MG TABS Take 1  tablet by mouth 2 (two) times daily.   . meloxicam (MOBIC) 15 MG tablet Take one daily with food for 2 weeks, can take with tylenol, can not take with aleve, iburpofen, then as needed daily for pain  . potassium chloride (K-DUR) 10 MEQ tablet Take 2 tablets Daily  . promethazine-dextromethorphan (PROMETHAZINE-DM) 6.25-15 MG/5ML syrup TAKE 5 MLS BY MOUTH FOUR TIMES A DAY AS NEEDED FOR COUGH  . rosuvastatin (CRESTOR) 40 MG tablet Take 1 tablet Daily for Cholesterol   No current facility-administered medications on file prior to visit.     ROS: all negative except above.   Physical Exam:  There were no vitals taken for this visit.  General Appearance: Well nourished, in no apparent distress. Eyes: PERRLA, EOMs, conjunctiva no swelling or erythema Sinuses: No Frontal/maxillary tenderness ENT/Mouth: Ext aud canals clear, TMs without erythema, bulging. No erythema, swelling, or exudate on post pharynx.  Tonsils not swollen or erythematous. Hearing normal.  Neck: Supple, thyroid normal.  Respiratory: Respiratory effort normal, BS equal bilaterally without rales, rhonchi, wheezing or stridor.  Cardio: RRR with no MRGs. Brisk peripheral pulses without edema.  Abdomen: Soft, + BS.  Non tender, no guarding, rebound, hernias, masses. Lymphatics: Non tender without lymphadenopathy.  Musculoskeletal: Full ROM, 5/5 strength, normal gait. Positive straight leg test.  Skin: Warm, dry without rashes, lesions, ecchymosis.  Neuro: Cranial nerves intact. Normal muscle tone, no cerebellar symptoms. Sensation intact.  Psych: Awake and oriented X 3, normal affect, Insight and Judgment appropriate.     Elder NegusKyra Kailena Lubas, NP 9:45 AM [facility]

## 2019-07-22 ENCOUNTER — Ambulatory Visit: Payer: Managed Care, Other (non HMO) | Admitting: Adult Health Nurse Practitioner

## 2019-07-22 ENCOUNTER — Other Ambulatory Visit: Payer: Self-pay

## 2019-07-22 ENCOUNTER — Encounter: Payer: Self-pay | Admitting: Adult Health Nurse Practitioner

## 2019-07-22 VITALS — BP 148/90 | HR 87 | Temp 97.3°F | Wt 163.2 lb

## 2019-07-22 DIAGNOSIS — M25561 Pain in right knee: Secondary | ICD-10-CM

## 2019-07-22 DIAGNOSIS — K219 Gastro-esophageal reflux disease without esophagitis: Secondary | ICD-10-CM | POA: Diagnosis not present

## 2019-07-22 DIAGNOSIS — M4050 Lordosis, unspecified, site unspecified: Secondary | ICD-10-CM

## 2019-07-22 DIAGNOSIS — I10 Essential (primary) hypertension: Secondary | ICD-10-CM

## 2019-07-22 DIAGNOSIS — G8929 Other chronic pain: Secondary | ICD-10-CM

## 2019-07-22 DIAGNOSIS — M25562 Pain in left knee: Secondary | ICD-10-CM

## 2019-07-22 MED ORDER — MELOXICAM 15 MG PO TABS: ORAL_TABLET | ORAL | 1 refills | Status: DC

## 2019-07-22 NOTE — Patient Instructions (Addendum)
We will send in Meloxicam 15mg  daily for two week.  Ice your low back for 31min at a time to help reduce inflammation  Continue to wear yoiur back brace and use the topical Aspercreme.  Below is general information and stretches to do for your back.  Do these after the pain has been reduce to help strengthen.  Please let us know if you have any new or worsening symptoms.  The next step could be prednisone or referral to Orthopedics/Nuerology for imaging.  Change the timing the following medicaitons.  Take your Hydrochlorothiazide 25mg  in the morning Also take the Pepsid 40mg  in the morning.  At night time take Gann to find the one that works the best for you.    BACK PAIN  Try the exercises and other information in the back care manual.   You can take meloxicam once during the day as needed (avoid taking other NSAIDS like Alleve or Ibuprofen while taking this)   You can take flexeril if needed at bedtime for muscle spasm. This can be taken up to every 8 hours, but causes sedation, so should not drive or operate heavy machinery while taking this medicine.   Go to the ER if you have any new weakness in your legs, have trouble controlling your urine or bowels, or have worsening pain.   If you are not better in 1-3 month we will refer you to ortho   Back pain Rehab Ask your health care provider which exercises are safe for you. Do exercises exactly as told by your health care provider and adjust them as directed. It is normal to feel mild stretching, pulling, tightness, or discomfort as you do these exercises, but you should stop right away if you feel sudden pain or your pain gets worse. Do not begin these exercises until told by your health care provider. Stretching and range of motion exercises These exercises warm up your muscles and joints and improve the movement and flexibility of your hips and your back. These exercises also help to relieve pain, numbness, and  tingling. Exercise A: Sciatic nerve glide 1. Sit in a chair with your head facing down toward your chest. Place your hands behind your back. Let your shoulders slump forward. 2. Slowly straighten one of your knees while you tilt your head back as if you are looking toward the ceiling. Only straighten your leg as far as you can without making your symptoms worse. 3. Hold for __________ seconds. 4. Slowly return to the starting position. 5. Repeat with your other leg. Repeat __________ times. Complete this exercise __________ times a day. Exercise B: Knee to chest with hip adduction and internal rotation  1. Lie on your back on a firm surface with both legs straight. 2. Bend one of your knees and move it up toward your chest until you feel a gentle stretch in your lower back and buttock. Then, move your knee toward the shoulder that is on the opposite side from your leg. ? Hold your leg in this position by holding onto the front of your knee. 3. Hold for __________ seconds. 4. Slowly return to the starting position. 5. Repeat with your other leg. Repeat __________ times. Complete this exercise __________ times a day. Exercise C: Prone extension on elbows  1. Lie on your abdomen on a firm surface. A bed may be too soft for this exercise. 2. Prop yourself up on your elbows. 3. Use your arms to help lift your chest  up until you feel a gentle stretch in your abdomen and your lower back. ? This will place some of your body weight on your elbows. If this is uncomfortable, try stacking pillows under your chest. ? Your hips should stay down, against the surface that you are lying on. Keep your hip and back muscles relaxed. 4. Hold for __________ seconds. 5. Slowly relax your upper body and return to the starting position. Repeat __________ times. Complete this exercise __________ times a day. Strengthening exercises These exercises build strength and endurance in your back. Endurance is the ability  to use your muscles for a long time, even after they get tired. Exercise D: Pelvic tilt 1. Lie on your back on a firm surface. Bend your knees and keep your feet flat. 2. Tense your abdominal muscles. Tip your pelvis up toward the ceiling and flatten your lower back into the floor. ? To help with this exercise, you may place a small towel under your lower back and try to push your back into the towel. 3. Hold for __________ seconds. 4. Let your muscles relax completely before you repeat this exercise. Repeat __________ times. Complete this exercise __________ times a day. Exercise E: Alternating arm and leg raises  1. Get on your hands and knees on a firm surface. If you are on a hard floor, you may want to use padding to cushion your knees, such as an exercise mat. 2. Line up your arms and legs. Your hands should be below your shoulders, and your knees should be below your hips. 3. Lift your left leg behind you. At the same time, raise your right arm and straighten it in front of you. ? Do not lift your leg higher than your hip. ? Do not lift your arm higher than your shoulder. ? Keep your abdominal and back muscles tight. ? Keep your hips facing the ground. ? Do not arch your back. ? Keep your balance carefully, and do not hold your breath. 4. Hold for __________ seconds. 5. Slowly return to the starting position and repeat with your right leg and your left arm. Repeat __________ times. Complete this exercise __________ times a day. Posture and body mechanics  Body mechanics refers to the movements and positions of your body while you do your daily activities. Posture is part of body mechanics. Good posture and healthy body mechanics can help to relieve stress in your body's tissues and joints. Good posture means that your spine is in its natural S-curve position (your spine is neutral), your shoulders are pulled back slightly, and your head is not tipped forward. The following are general  guidelines for applying improved posture and body mechanics to your everyday activities. Standing   When standing, keep your spine neutral and your feet about hip-width apart. Keep a slight bend in your knees. Your ears, shoulders, and hips should line up.  When you do a task in which you stand in one place for a long time, place one foot up on a stable object that is 2-4 inches (5-10 cm) high, such as a footstool. This helps keep your spine neutral. Sitting   When sitting, keep your spine neutral and keep your feet flat on the floor. Use a footrest, if necessary, and keep your thighs parallel to the floor. Avoid rounding your shoulders, and avoid tilting your head forward.  When working at a desk or a computer, keep your desk at a height where your hands are slightly lower than your  elbows. Slide your chair under your desk so you are close enough to maintain good posture.  When working at a computer, place your monitor at a height where you are looking straight ahead and you do not have to tilt your head forward or downward to look at the screen. Resting   When lying down and resting, avoid positions that are most painful for you.  If you have pain with activities such as sitting, bending, stooping, or squatting (flexion-based activities), lie in a position in which your body does not bend very much. For example, avoid curling up on your side with your arms and knees near your chest (fetal position).  If you have pain with activities such as standing for a long time or reaching with your arms (extension-based activities), lie with your spine in a neutral position and bend your knees slightly. Try the following positions: ? Lying on your side with a pillow between your knees. ? Lying on your back with a pillow under your knees. Lifting   When lifting objects, keep your feet at least shoulder-width apart and tighten your abdominal muscles.  Bend your knees and hips and keep your spine  neutral. It is important to lift using the strength of your legs, not your back. Do not lock your knees straight out.  Always ask for help to lift heavy or awkward objects. This information is not intended to replace advice given to you by your health care provider. Make sure you discuss any questions you have with your health care provider. Document Released: 11/27/2005 Document Revised: 08/03/2016 Document Reviewed: 08/13/2015 [employer] Interactive Patient Education  [employer]2018 [employer]

## 2019-07-27 ENCOUNTER — Encounter: Payer: Self-pay | Admitting: Adult Health Nurse Practitioner

## 2019-08-11 ENCOUNTER — Encounter: Payer: Self-pay | Admitting: Adult Health

## 2019-08-14 ENCOUNTER — Other Ambulatory Visit: Payer: Self-pay | Admitting: Internal Medicine

## 2019-08-19 NOTE — Progress Notes (Signed)
Complete Physical  Assessment and Plan:   Encounter for general adult medical examination with abnormal findings Mammogram ordered to be scheduled due to poor follow through  Essential hypertension Stop clonidine, increase amlodipine to 10 mg daily, monitor for swelling, continue HCTZ Recheck NV in 2-3 weeks - continue medications, DASH diet, exercise and monitor at home. Call if greater than 130/80.  -     CBC with Differential/Platelet -     CMP/GFR -     TSH -     Urinalysis, Routine w reflex microscopic -     Microalbumin / creatinine urine ratio -     EKG 12-Lead  Mixed hyperlipidemia -continue medications, check lipids, decrease fatty foods, increase activity.  -     Lipid panel  Medication management -     Magnesium  Recurrent major depressive disorder, in partial remission (HCC) - continue medications, stress management techniques discussed, increase water, good sleep hygiene discussed, increase exercise, and increase veggies.   Vitamin D deficiency Continue supplement  Anxiety stress management techniques discussed, increase water, good sleep hygiene discussed, increase exercise, and increase veggies.   ESOPHAGEAL STRICTURE Continue PPI/H2 blocker, diet discussed  Gastroesophageal reflux disease, esophagitis presence not specified Continue PPI/H2 blocker, diet discussed  Prediabetes Defer A1C as just had last visit, insurance likely wouldn't cover Checked insulin levels Emphasized weight loss, diet/exercise  Hot flashes Patient is on lexapro, ? Whether helps Has not seen benefit with gabapentin, clonidine Declines other interventions Encouraged weight loss  Low back pain  Negative straight leg raise; Replace mattress; back exercises provided; mobic 7.5 -15 mg once daily PRN at work Discussed PT referral; she declines this today     Future Appointments  Date Time Provider Luis Lopez  08/19/2020  9:00 AM Liane Comber, NP [facility] None      HPI  63 y.o. female  presents for a complete physical. She has Depression with anxiety; ESOPHAGEAL STRICTURE; Esophageal reflux; Vitamin D deficiency; Hypertension; Hyperlipemia; Medication management; Overweight (BMI 25.0-29.9); Other abnormal glucose (prediabetes); and Elevated LFTs on their problem list.   She is divorced, works at [employer], no children.   She does not have any new concerns.  She is on lexapro for mood and feels this is helpful.   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 She has been wearing brace at work which has been helpful. She is taking meloxicam 15 mg some days when working.   BMI is Body mass index is 29.7 kg/m., she has not been working on diet and exercise. 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  She drinks mainly water, drinks 1-2 bottles of 20 oz 1 cup of coffee daily  Wt Readings from Last 3 Encounters:  08/20/19 165 lb (74.8 kg)  07/22/19 163 lb 3.2 oz (74 kg)  05/28/19 162 lb (73.5 kg)   She has not been checking BPs at home, today their BP is BP: 130/84.   She does not workout. She denies chest pain, shortness of breath, dizziness.   She is on cholesterol medication (pravastatin 40 mg daily) and denies myalgias. Her cholesterol is at goal. The cholesterol last visit was:  Lab Results  Component Value Date   CHOL 159 05/28/2019   HDL 54 05/28/2019   LDLCALC 80 05/28/2019   TRIG 156 (H) 05/28/2019   CHOLHDL 2.9  05/28/2019  . She has not been working on diet and exercise for prediabetes, and denies foot ulcerations, hyperglycemia, hypoglycemia , increased appetite, nausea, paresthesia of the feet, polydipsia, polyuria, visual disturbances, vomiting and weight loss. Last A1C in the office was:  Lab Results  Component Value Date   HGBA1C 6.0 (H) 05/28/2019   Lab Results  Component  Value Date   GFRAA 70 07/16/2019   Patient is on Vitamin D supplement, taking 1610910000 IU daily   Lab Results  Component Value Date   VD25OH 56 07/12/2018     Lab Results  Component Value Date   ALT 39 (H) 05/28/2019   AST 30 05/28/2019   ALKPHOS 81 06/28/2017   BILITOT 0.4 05/28/2019     Current Medications:  Current Outpatient Medications on File Prior to Visit  Medication Sig Dispense Refill  . albuterol (VENTOLIN HFA) 108 (90 Base) MCG/ACT inhaler Inhale 2 puffs into the lungs every 4 (four) hours as needed for wheezing or shortness of breath. 1 Inhaler 0  . amLODipine (NORVASC) 5 MG tablet TAKE ONE TABLET BY MOUTH DAILY FOR FOR BLOOD PRESSURE 90 tablet 0  . aspirin 81 MG tablet Take 81 mg by mouth daily.    Marland Kitchen. azelastine (ASTELIN) 0.1 % nasal spray PLACE 2 SPRAYS INTO BOTH NOSTRILS 2 (TWO) TIMES DAILY. USE IN EACH NOSTRIL AS DIRECTED 30 mL 1  . Biotin 300 MCG TABS Take 1 tablet by mouth at bedtime.     . CHOLECALCIFEROL PO Take 10,000 Units by mouth daily.    . cloNIDine (CATAPRES) 0.1 MG tablet Take 1 tablet Daily for BP 90 tablet 3  . escitalopram (LEXAPRO) 20 MG tablet Take 1 tablet Daily for Mood 90 tablet 1  . famotidine (PEPCID) 40 MG tablet Take 1 tablet at bedtime for Acid Reflux 90 tablet 3  . hydrochlorothiazide (HYDRODIURIL) 25 MG tablet Take 1 tablet (25 mg total) by mouth daily. 90 tablet 1  . latanoprost (XALATAN) 0.005 % ophthalmic solution Place 1 drop into both eyes at bedtime.    . Magnesium 250 MG TABS Take 1 tablet by mouth 2 (two) times daily.     . meloxicam (MOBIC) 15 MG tablet Take one daily with food for 2 weeks, can take with tylenol, can not take with aleve, iburpofen, then as needed daily for pain 30 tablet 1  . potassium chloride (K-DUR) 10 MEQ tablet Take 2 tablets Daily 180 tablet 1  . rosuvastatin (CRESTOR) 40 MG tablet Take 1 tablet Daily for Cholesterol 90 tablet 0  . fluticasone (FLONASE) 50 MCG/ACT nasal spray SPRAY TWO SPRAYS IN EACH NOSTRIL  ONCE DAILY 16 g 2  . promethazine-dextromethorphan (PROMETHAZINE-DM) 6.25-15 MG/5ML syrup TAKE 5 MLS BY MOUTH FOUR TIMES A DAY AS NEEDED FOR COUGH (Patient not taking: Reported on 08/20/2019) 240 mL 0   No current facility-administered medications on file prior to visit.     Health Maintenance:   Immunization History  Administered Date(s) Administered  . Influenza Split 08/30/2015  . Influenza-Unspecified 10/09/2017  . Pneumococcal-Unspecified 12/11/2008  . Td 12/11/2005  . Tdap 12/30/2015  . Zoster Recombinat (Shingrix) 04/26/2017, 07/25/2017   Tetanus:2017 Pneumovax: 2010, due age 63 Flu vaccine: 2017, 2020 Shingles 2018  LMP: postmenopausal Pap: 2018 neg, HPV neg, due 2021-2023 MGM:11/2014 DUE - poor follow through, will schedule   DEXA: get at age 63  Colonoscopy: 2011, Dr Arlyce DiceKaplan EGD: 2015  Last Dental Exam: 2020, goes q621m Last Eye Exam: 2020, goes annually, glasses  Patient Care  Team: Lucky Cowboy, MD as PCP - General (Internal Medicine) Louis Meckel, MD (Inactive) as Consulting Physician (Gastroenterology) Shea Evans, MD as Consulting Physician (Obstetrics and Gynecology) Nadara Mustard, MD as Consulting Physician (Orthopedic Surgery)  Medical History:  Past Medical History:  Diagnosis Date  . Allergy   . Anxiety   . Depression   . Esophageal stricture   . GERD (gastroesophageal reflux disease)   . Hyperlipemia   . Hypertension   . Unspecified vitamin D deficiency    Allergies Allergies  Allergen Reactions  . 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 Clinoril [Sulindac] Rash    SURGICAL HISTORY She  has a past surgical history that includes Cholecystectomy; Esophageal dilation (2009); and LEEP (2004). FAMILY HISTORY Her family history includes Breast cancer (age of onset: 69) in her sister; Diabetes in her sister; Glaucoma in her mother and sister; Hyperlipidemia in her mother; Hypertension in her  brother, mother, and sister; Stroke (age of onset: 55) in her mother. SOCIAL HISTORY She  reports that she quit smoking about 17 years ago. Her smoking use included cigarettes. She has a 1.50 pack-year smoking history. She has never used smokeless tobacco. She reports that she does not drink alcohol or use drugs.  Review of Systems: Review of Systems  Constitutional: Negative for chills, diaphoresis, fever and malaise/fatigue.  HENT: Negative for congestion, ear pain and sore throat.   Eyes: Negative.   Respiratory: Negative for cough, shortness of breath and wheezing.   Cardiovascular: Negative for chest pain, palpitations and leg swelling.  Gastrointestinal: Negative for abdominal pain, blood in stool, constipation, diarrhea, heartburn and melena.  Genitourinary: Negative.        Urge incontinence, wears 1-2 pads per day  Musculoskeletal: Positive for back pain. Negative for falls, joint pain, myalgias and neck pain.  Skin: Negative.   Neurological: Negative for dizziness, sensory change, loss of consciousness and headaches.  Psychiatric/Behavioral: Negative for depression. The patient is not nervous/anxious and does not have insomnia.     Physical Exam: Estimated body mass index is 29.7 kg/m as calculated from the following:   Height as of this encounter: 5' 2.5" (1.588 m).   Weight as of this encounter: 165 lb (74.8 kg). BP 130/84   Pulse 82   Temp (!) 97.2 F (36.2 C)   Ht 5' 2.5" (1.588 m)   Wt 165 lb (74.8 kg)   SpO2 99%   BMI 29.70 kg/m   General Appearance: Well nourished well developed, in no apparent distress.  Eyes: PERRLA, EOMs, conjunctiva no swelling or erythema ENT/Mouth: Ear canals normal without obstruction, swelling, erythema, or discharge.  TMs normal bilaterally with no erythema, bulging, retraction, or loss of landmark.  Oropharynx moist and clear with no exudate, erythema, or swelling.   Neck: Supple, thyroid normal. No bruits.  No cervical  adenopathy Respiratory: Respiratory effort normal, Breath sounds clear A&P without wheeze, rhonchi, rales.   Cardio: RRR without murmurs, rubs or gallops. Brisk peripheral pulses without edema.  Chest: symmetric, with normal excursions Breasts: Symmetric, without lumps, nipple discharge, retractions.  Abdomen: Soft, nontender, no guarding, rebound, hernias, masses, or organomegaly.  Lymphatics: Non tender without lymphadenopathy.  Genitourinary: defer Musculoskeletal: Full ROM all peripheral extremities,5/5 strength, and normal gait.  Skin: Warm, dry without rashes, lesions, ecchymosis. Neuro: Awake and oriented X 3, Cranial nerves intact, reflexes equal bilaterally. Normal muscle tone, no cerebellar symptoms. Sensation intact.  Psych:  normal affect,  Insight and Judgment appropriate.   EKG: NSR, inverted T waves in leads III and aVR, no notable changes from previous AORTA SCAN: defer  Over 40 minutes of exam, counseling, chart review and critical decision making was performed  Dan MakerAshley C Lila Lufkin 9:26 AM [facility]

## 2019-08-20 ENCOUNTER — Encounter: Payer: Self-pay | Admitting: Adult Health

## 2019-08-20 ENCOUNTER — Ambulatory Visit (INDEPENDENT_AMBULATORY_CARE_PROVIDER_SITE_OTHER): Payer: Managed Care, Other (non HMO) | Admitting: Adult Health

## 2019-08-20 ENCOUNTER — Other Ambulatory Visit: Payer: Self-pay

## 2019-08-20 VITALS — BP 130/84 | HR 82 | Temp 97.2°F | Ht 62.5 in | Wt 165.0 lb

## 2019-08-20 DIAGNOSIS — Z1329 Encounter for screening for other suspected endocrine disorder: Secondary | ICD-10-CM

## 2019-08-20 DIAGNOSIS — R7309 Other abnormal glucose: Secondary | ICD-10-CM

## 2019-08-20 DIAGNOSIS — I10 Essential (primary) hypertension: Secondary | ICD-10-CM

## 2019-08-20 DIAGNOSIS — Z1389 Encounter for screening for other disorder: Secondary | ICD-10-CM

## 2019-08-20 DIAGNOSIS — E559 Vitamin D deficiency, unspecified: Secondary | ICD-10-CM

## 2019-08-20 DIAGNOSIS — Z0000 Encounter for general adult medical examination without abnormal findings: Secondary | ICD-10-CM | POA: Diagnosis not present

## 2019-08-20 DIAGNOSIS — N3941 Urge incontinence: Secondary | ICD-10-CM

## 2019-08-20 DIAGNOSIS — Z136 Encounter for screening for cardiovascular disorders: Secondary | ICD-10-CM | POA: Diagnosis not present

## 2019-08-20 DIAGNOSIS — R7989 Other specified abnormal findings of blood chemistry: Secondary | ICD-10-CM

## 2019-08-20 DIAGNOSIS — R8290 Unspecified abnormal findings in urine: Secondary | ICD-10-CM

## 2019-08-20 DIAGNOSIS — E782 Mixed hyperlipidemia: Secondary | ICD-10-CM

## 2019-08-20 DIAGNOSIS — Z1159 Encounter for screening for other viral diseases: Secondary | ICD-10-CM

## 2019-08-20 DIAGNOSIS — E663 Overweight: Secondary | ICD-10-CM

## 2019-08-20 DIAGNOSIS — M4050 Lordosis, unspecified, site unspecified: Secondary | ICD-10-CM

## 2019-08-20 DIAGNOSIS — Z13220 Encounter for screening for lipoid disorders: Secondary | ICD-10-CM

## 2019-08-20 DIAGNOSIS — K222 Esophageal obstruction: Secondary | ICD-10-CM

## 2019-08-20 DIAGNOSIS — F418 Other specified anxiety disorders: Secondary | ICD-10-CM

## 2019-08-20 DIAGNOSIS — K219 Gastro-esophageal reflux disease without esophagitis: Secondary | ICD-10-CM

## 2019-08-20 DIAGNOSIS — Z0001 Encounter for general adult medical examination with abnormal findings: Secondary | ICD-10-CM

## 2019-08-20 DIAGNOSIS — Z1239 Encounter for other screening for malignant neoplasm of breast: Secondary | ICD-10-CM

## 2019-08-20 DIAGNOSIS — Z79899 Other long term (current) drug therapy: Secondary | ICD-10-CM | POA: Diagnosis not present

## 2019-08-20 MED ORDER — MELOXICAM 15 MG PO TABS: ORAL_TABLET | ORAL | 0 refills | Status: DC

## 2019-08-20 MED ORDER — POTASSIUM CHLORIDE ER 10 MEQ PO TBCR: EXTENDED_RELEASE_TABLET | ORAL | 1 refills | Status: DC

## 2019-08-20 MED ORDER — ESCITALOPRAM OXALATE 20 MG PO TABS: ORAL_TABLET | ORAL | 1 refills | Status: DC

## 2019-08-20 MED ORDER — AMLODIPINE BESYLATE 10 MG PO TABS: ORAL_TABLET | ORAL | 1 refills | Status: DC

## 2019-08-20 NOTE — Patient Instructions (Addendum)
Ms. Brookshire , Thank you for taking time to come for your Annual Wellness Visit. I appreciate your ongoing commitment to your health goals. Please review the following plan we discussed and let me know if I can assist you in the future.   These are the goals we discussed: Goals    . HEMOGLOBIN A1C < 5.7    . LDL CALC < 100    . Weight (lb) < 150 lb (68 kg)       This is a list of the screening recommended for you and due dates:  Health Maintenance  Topic Date Due  . Mammogram  11/17/2016  . Flu Shot  07/12/2019  . Colon Cancer Screening  02/09/2020  . Pap Smear  06/28/2020  . Tetanus Vaccine  12/29/2025  .  Hepatitis C: One time screening is recommended by Center for Disease Control  (CDC) for  adults born from [birth year] through 1963.   Completed  . HIV Screening  Completed    Know what a healthy weight is for you (roughly BMI <25) and aim to maintain this  Aim for 7+ servings of fruits and vegetables daily  65-80+ fluid ounces of water or unsweet tea for healthy kidneys  Limit to max 1 drink of alcohol per day; avoid smoking/tobacco  Limit animal fats in diet for cholesterol and heart health - choose grass fed whenever available  Avoid highly processed foods, and foods high in saturated/trans fats  Aim for low stress - take time to unwind and care for your mental health  Aim for 150 min of moderate intensity exercise weekly for heart health, and weights twice weekly for bone health  Aim for 7-9 hours of sleep daily    Try working on [employer] exercises that we discussed   Urinary Incontinence  Urinary incontinence refers to a condition in which a person is unable to control where and when to pass urine. A person with this condition will urinate when he or she does not mean to (involuntarily). What are the causes? This condition may be caused by:  Medicines.  Infections.  Constipation.  Overactive bladder muscles.  Weak bladder muscles.  Weak pelvic floor muscles.  These muscles provide support for the bladder, intestine, and, in women, the uterus.  Enlarged prostate in men. The prostate is a gland near the bladder. When it gets too big, it can pinch the urethra. With the urethra blocked, the bladder can weaken and lose the ability to empty properly.  Surgery.  Emotional factors, such as anxiety, stress, or post-traumatic stress disorder (PTSD).  Pelvic organ prolapse. This happens in women when organs shift out of place and into the vagina. This shift can prevent the bladder and urethra from working properly. What increases the risk? The following factors may make you more likely to develop this condition:  Older age.  Obesity and physical inactivity.  Pregnancy and childbirth.  Menopause.  Diseases that affect the nerves or spinal cord (neurological diseases).  Long-term (chronic) coughing. This can increase pressure on the bladder and pelvic floor muscles. What are the signs or symptoms? Symptoms may vary depending on the type of urinary incontinence you have. They include:  A sudden urge to urinate, but passing urine involuntarily before you can get to a bathroom (urge incontinence).  Suddenly passing urine with any activity that forces urine to pass, such as coughing, laughing, exercise, or sneezing (stress incontinence).  Needing to urinate often, but urinating only a small amount, or constantly dribbling  urine (overflow incontinence).  Urinating because you cannot get to the bathroom in time due to a physical disability, such as arthritis or injury, or communication and thinking problems, such as Alzheimer disease (functional incontinence). How is this diagnosed? This condition may be diagnosed based on:  Your medical history.  A physical exam.  Tests, such as: ? Urine tests. ? X-rays of your kidney and bladder. ? Ultrasound. ? CT scan. ? Cystoscopy. In this procedure, a health care provider inserts a tube with a light and  camera (cystoscope) through the urethra and into the bladder in order to check for problems. ? Urodynamic testing. These tests assess how well the bladder, urethra, and sphincter can store and release urine. There are different types of urodynamic tests, and they vary depending on what the test is measuring. To help diagnose your condition, your health care provider may recommend that you keep a log of when you urinate and how much you urinate. How is this treated? Treatment for this condition depends on the type of incontinence that you have and its cause. Treatment may include:  Lifestyle changes, such as: ? Quitting smoking. ? Maintaining a healthy weight. ? Staying active. Try to get 150 minutes of moderate-intensity exercise every week. Ask your health care provider which activities are safe for you. ? Eating a healthy diet.  Avoid high-fat foods, like fried foods.  Avoid refined carbohydrates like white bread and white rice.  Limit how much alcohol and caffeine you drink.  Increase your fiber intake. Foods such as fresh fruits, vegetables, beans, and whole grains are healthy sources of fiber.  Pelvic floor muscle exercises.  Bladder training, such as lengthening the amount of time between bathroom breaks, or using the bathroom at regular intervals.  Using techniques to suppress bladder urges. This can include distraction techniques or controlled breathing exercises.  Medicines to relax the bladder muscles and prevent bladder spasms.  Medicines to help slow or prevent the growth of a man's prostate.  Botox injections. These can help relax the bladder muscles.  Using pulses of electricity to help change bladder reflexes (electrical nerve stimulation).  For women, using a medical device to prevent urine leaks. This is a small, tampon-like, disposable device that is inserted into the urethra.  Injecting collagen or carbon beads (bulking agents) into the urinary sphincter. These  can help thicken tissue and close the bladder opening.  Surgery. Follow these instructions at home: Lifestyle  Limit alcohol and caffeine. These can fill your bladder quickly and irritate it.  Keep yourself clean to help prevent odors and skin damage. Ask your doctor about special skin creams and cleansers that can protect the skin from urine.  Consider wearing pads or adult diapers. Make sure to change them regularly, and always change them right after experiencing incontinence. General instructions  Take over-the-counter and prescription medicines only as told by your health care provider.  Use the bathroom about every 3-4 hours, even if you do not feel the need to urinate. Try to empty your bladder completely every time. After urinating, wait a minute. Then try to urinate again.  Make sure you are in a relaxed position while urinating.  If your incontinence is caused by nerve problems, keep a log of the medicines you take and the times you go to the bathroom.  Keep all follow-up visits as told by your health care provider. This is important. Contact a health care provider if:  You have pain that gets worse.  Your incontinence  gets worse. Get help right away if:  You have a fever or chills.  You are unable to urinate.  You have redness in your groin area or down your legs. Summary  Urinary incontinence refers to a condition in which a person is unable to control where and when to pass urine.  This condition may be caused by medicines, infection, weak bladder muscles, weak pelvic floor muscles, enlargement of the prostate (in men), or surgery.  The following factors increase your risk for developing this condition: older age, obesity, pregnancy and childbirth, menopause, neurological diseases, and chronic coughing.  There are several types of urinary incontinence. They include urge incontinence, stress incontinence, overflow incontinence, and functional  incontinence.  This condition is usually treated first with lifestyle and behavioral changes, such as quitting smoking, eating a healthier diet, and doing regular pelvic floor exercises. Other treatment options include medicines, bulking agents, medical devices, electrical nerve stimulation, or surgery. This information is not intended to replace advice given to you by your health care provider. Make sure you discuss any questions you have with your health care provider. Document Released: 01/04/2005 Document Revised: 12/07/2017 Document Reviewed: 03/08/2017 Elsevier Patient Education  2020 [employer].

## 2019-08-21 ENCOUNTER — Other Ambulatory Visit: Payer: Self-pay | Admitting: Adult Health

## 2019-08-21 DIAGNOSIS — R8290 Unspecified abnormal findings in urine: Secondary | ICD-10-CM

## 2019-08-21 DIAGNOSIS — R7989 Other specified abnormal findings of blood chemistry: Secondary | ICD-10-CM

## 2019-08-21 LAB — CBC WITH DIFFERENTIAL/PLATELET
Absolute Monocytes: 340 cells/uL (ref 200–950)
Basophils Absolute: 30 cells/uL (ref 0–200)
Basophils Relative: 0.7 %
Eosinophils Absolute: 189 cells/uL (ref 15–500)
Eosinophils Relative: 4.4 %
HCT: 42 % (ref 35.0–45.0)
Hemoglobin: 14.4 g/dL (ref 11.7–15.5)
Lymphs Abs: 2111 cells/uL (ref 850–3900)
MCH: 32.6 pg (ref 27.0–33.0)
MCHC: 34.3 g/dL (ref 32.0–36.0)
MCV: 95 fL (ref 80.0–100.0)
MPV: 10.8 fL (ref 7.5–12.5)
Monocytes Relative: 7.9 %
Neutro Abs: 1630 cells/uL (ref 1500–7800)
Neutrophils Relative %: 37.9 %
Platelets: 197 10*3/uL (ref 140–400)
RBC: 4.42 10*6/uL (ref 3.80–5.10)
RDW: 12.5 % (ref 11.0–15.0)
Total Lymphocyte: 49.1 %
WBC: 4.3 10*3/uL (ref 3.8–10.8)

## 2019-08-21 LAB — COMPLETE METABOLIC PANEL WITH GFR
AG Ratio: 2 (calc) (ref 1.0–2.5)
ALT: 53 U/L — ABNORMAL HIGH (ref 6–29)
AST: 50 U/L — ABNORMAL HIGH (ref 10–35)
Albumin: 4.5 g/dL (ref 3.6–5.1)
Alkaline phosphatase (APISO): 90 U/L (ref 37–153)
BUN: 14 mg/dL (ref 7–25)
CO2: 31 mmol/L (ref 20–32)
Calcium: 10 mg/dL (ref 8.6–10.4)
Chloride: 107 mmol/L (ref 98–110)
Creat: 0.86 mg/dL (ref 0.50–0.99)
GFR, Est African American: 85 mL/min/{1.73_m2} (ref >=60)
GFR, Est Non African American: 73 mL/min/{1.73_m2} (ref >=60)
Globulin: 2.3 g/dL (calc) (ref 1.9–3.7)
Glucose, Bld: 109 mg/dL — ABNORMAL HIGH (ref 65–99)
Potassium: 4.1 mmol/L (ref 3.5–5.3)
Sodium: 145 mmol/L (ref 135–146)
Total Bilirubin: 0.2 mg/dL (ref 0.2–1.2)
Total Protein: 6.8 g/dL (ref 6.1–8.1)

## 2019-08-21 LAB — LIPID PANEL
Cholesterol: 160 mg/dL (ref <200)
HDL: 54 mg/dL (ref >=50)
LDL Cholesterol (Calc): 75 mg/dL (calc)
Non-HDL Cholesterol (Calc): 106 mg/dL (calc) (ref <130)
Total CHOL/HDL Ratio: 3 (calc) (ref <5.0)
Triglycerides: 225 mg/dL — ABNORMAL HIGH (ref <150)

## 2019-08-21 LAB — URINALYSIS, ROUTINE W REFLEX MICROSCOPIC
Bilirubin Urine: NEGATIVE
Glucose, UA: NEGATIVE
Nitrite: POSITIVE — AB
Specific Gravity, Urine: 1.023 (ref 1.001–1.03)
pH: 8 (ref 5.0–8.0)

## 2019-08-21 LAB — MICROALBUMIN / CREATININE URINE RATIO
Creatinine, Urine: 238 mg/dL (ref 20–275)
Microalb Creat Ratio: 43 mcg/mg creat — ABNORMAL HIGH (ref <30)
Microalb, Ur: 10.3 mg/dL

## 2019-08-21 LAB — HEPATITIS PANEL, ACUTE
Hep A IgM: NONREACTIVE
Hep B C IgM: NONREACTIVE
Hepatitis B Surface Ag: NONREACTIVE
Hepatitis C Ab: NONREACTIVE
SIGNAL TO CUT-OFF: 0.01 (ref <1.00)

## 2019-08-21 LAB — TSH: TSH: 0.78 mIU/L (ref 0.40–4.50)

## 2019-08-21 LAB — MAGNESIUM: Magnesium: 2 mg/dL (ref 1.5–2.5)

## 2019-08-21 LAB — VITAMIN D 25 HYDROXY (VIT D DEFICIENCY, FRACTURES): Vit D, 25-Hydroxy: 73 ng/mL (ref 30–100)

## 2019-08-21 LAB — INSULIN, RANDOM: Insulin: 11 u[IU]/mL

## 2019-08-21 NOTE — Addendum Note (Signed)
Addended by: Lekendrick Alpern S on: 08/21/2019 08:48 AM   Modules accepted: Orders  

## 2019-08-23 ENCOUNTER — Other Ambulatory Visit: Payer: Self-pay | Admitting: Adult Health

## 2019-08-23 DIAGNOSIS — N390 Urinary tract infection, site not specified: Secondary | ICD-10-CM

## 2019-08-23 LAB — URINE CULTURE
MICRO NUMBER:: 867271
SPECIMEN QUALITY:: ADEQUATE

## 2019-08-23 MED ORDER — SULFAMETHOXAZOLE-TRIMETHOPRIM 800-160 MG PO TABS: 1.0000 | ORAL_TABLET | Freq: Two times a day (BID) | ORAL | 0 refills | Status: DC

## 2019-09-10 ENCOUNTER — Ambulatory Visit: Payer: Managed Care, Other (non HMO)

## 2019-09-29 ENCOUNTER — Other Ambulatory Visit: Payer: Self-pay

## 2019-09-29 ENCOUNTER — Ambulatory Visit: Payer: Managed Care, Other (non HMO)

## 2019-09-29 DIAGNOSIS — Z79899 Other long term (current) drug therapy: Secondary | ICD-10-CM

## 2019-09-29 DIAGNOSIS — N390 Urinary tract infection, site not specified: Secondary | ICD-10-CM

## 2019-09-29 DIAGNOSIS — N3090 Cystitis, unspecified without hematuria: Secondary | ICD-10-CM | POA: Diagnosis not present

## 2019-09-29 NOTE — Progress Notes (Signed)
Patient presents to the office for a nurse visit to provide a urine specimen for previous treatment of a UTI. Patient states that did complete the antibiotics even though she never experienced and signs or symptoms. Vitals taken and recorded.

## 2019-09-29 NOTE — Addendum Note (Signed)
Addended by: Eulis Canner on: 09/29/2019 10:04 AM   Modules accepted: Orders

## 2019-09-30 LAB — URINALYSIS W MICROSCOPIC + REFLEX CULTURE
Bacteria, UA: NONE SEEN /HPF
Bilirubin Urine: NEGATIVE
Glucose, UA: NEGATIVE
Hgb urine dipstick: NEGATIVE
Hyaline Cast: NONE SEEN /LPF
Ketones, ur: NEGATIVE
Leukocyte Esterase: NEGATIVE
Nitrites, Initial: NEGATIVE
Specific Gravity, Urine: 1.017 (ref 1.001–1.03)
Squamous Epithelial / HPF: NONE SEEN /HPF (ref <=5)
WBC, UA: NONE SEEN /HPF (ref 0–5)
pH: 6 (ref 5.0–8.0)

## 2019-09-30 LAB — NO CULTURE INDICATED

## 2019-10-03 ENCOUNTER — Ambulatory Visit: Payer: Managed Care, Other (non HMO)

## 2019-11-08 ENCOUNTER — Other Ambulatory Visit: Payer: Self-pay | Admitting: Internal Medicine

## 2019-11-19 ENCOUNTER — Other Ambulatory Visit: Payer: Self-pay | Admitting: Adult Health

## 2019-11-19 MED ORDER — AMLODIPINE BESYLATE 10 MG PO TABS: ORAL_TABLET | ORAL | 1 refills | Status: DC

## 2019-11-19 NOTE — Progress Notes (Signed)
FOLLOW UP  Assessment and Plan:   Hypertension Well controlled - currently off of amlodipine; monitor with just HCTZ; if remains at goal will d/c amlodipine  Monitor blood pressure at home; patient to call if consistently greater than 130/80 Continue DASH diet.   Reminder to go to the ER if any CP, SOB, nausea, dizziness, severe HA, changes vision/speech, left arm numbness and tingling and jaw pain.  Cholesterol Currently at goal; continue rosuvastatin 40 mg daily  Continue low cholesterol diet and exercise.  Check lipid panel.   Prediabetes Continue diet and exercise.  Perform daily foot/skin check, notify office of any concerning changes.  Check A1C  Overweight with comorbidities Commended on weight loss progress thus far Long discussion about weight loss, diet, and exercise Recommended diet heavy in fruits and veggies and low in animal meats, cheeses, and dairy products, appropriate calorie intake Discussed ideal weight for height and initial weight goal (<150lb) Start weighing weekly, cut down on snacking at work  Will follow up in 3 months  Vitamin D Def Near goal at last check continue supplementation to maintain goal of 60-100 Defer Vit D level to next CPE  Depression/anxiety Continue medications, lexapro helpful Lifestyle discussed: diet/exerise, sleep hygiene, stress management, hydration  GERD/ hx of esophageal stricture ? Some coughing at night r/t breakthrough; continue famotidine; adding omeprazole 20 mg at night x 2 weeks and evaluate cough; follow up if improves Discussed diet, avoiding triggers and other lifestyle changes  Cough GERD vs allergic rhinitis post nasal drip; trial addition of PPI If no improvement in 2 weeks restart nasal sprays  LFT elevation New, mild; hepatitis was negative 08/2019 Pending [place]   Continue diet and meds as discussed. Further disposition pending results of labs. Discussed med's effects and SE's.   Over 30 minutes of  exam, counseling, chart review, and critical decision making was performed.   Future Appointments  Date Time Provider [place]  11/21/2019  1:40 PM [facility] 2 [facility]  02/23/2020 11:00 AM Liane Comber, NP [facility] None  08/19/2020  9:00 AM Liane Comber, NP [facility] None    ----------------------------------------------------------------------------------------------------------------------  HPI 63 y.o. female  presents for 3 month follow up on hypertension, cholesterol, prediabetes, weight, GERD, anxiety and vitamin D deficiency.   she has a diagnosis of recurrent depression/anxiety and is currently on lexapro 20 mg daily, reports symptoms are well controlled on current regimen. She reports can tell when she misses her medication.   She does not have formal diagnosis of asthma but is albuterol for reports of wheezing with seasonal changes which she takes intermittently as needed and finds very beneficial though doing well at this time. She is taking zyrtec daily. She does endorse some post nasal drip and cough when she lies down at night. Hasn't been taking nasal sprays.    She has esophageal reflux and stricture (dilation in 2015), on famotidine 40 mg daily, has been on PPI previously, denies current reflux symptoms, burning, hoarseness. Does have cough at night.   BMI is Body mass index is 30.42 kg/m., she is working on diet though not as good as she'd like, eating fruit, salads, greens, exercise is limited due to busy season at work.  Goal weight is 140 lb. She fill start after the holiday.  Wt Readings from Last 3 Encounters:  11/20/19 169 lb (76.7 kg)  09/29/19 166 lb (75.3 kg)  08/20/19 165 lb (74.8 kg)   She admits hasn't been checking BP at home, does have  a cuff, today their BP is BP: 124/68  She does not workout. She denies chest pain, shortness of breath, dizziness.   She is on cholesterol medication Rosuvastatin 40 mg daily and denies  myalgias. Her cholesterol is at goal. The cholesterol last visit was:   Lab Results  Component Value Date   CHOL 160 08/20/2019   HDL 54 08/20/2019   LDLCALC 75 08/20/2019   TRIG 225 (H) 08/20/2019   CHOLHDL 3.0 08/20/2019    She has been working on diet and exercise for prediabetes, and denies increased appetite, nausea, paresthesia of the feet, polydipsia, polyuria and visual disturbances. Last A1C in the office was:  Lab Results  Component Value Date   HGBA1C 6.0 (H) 05/28/2019   Patient is on Vitamin D supplement.   Lab Results  Component Value Date   VD25OH 73 08/20/2019     Lab Results  Component Value Date   GFRAA 85 08/20/2019   She has new mild LFT elevations this year; hepatitis panel was negative 08/20/2019 Lab Results  Component Value Date   ALT 53 (H) 08/20/2019   AST 50 (H) 08/20/2019   ALKPHOS 81 06/28/2017   BILITOT 0.2 08/20/2019     Current Medications:  Current Outpatient Medications on File Prior to Visit  Medication Sig  . albuterol (VENTOLIN HFA) 108 (90 Base) MCG/ACT inhaler Inhale 2 puffs into the lungs every 4 (four) hours as needed for wheezing or shortness of breath.  Marland Kitchen amLODipine (NORVASC) 10 MG tablet TAKE ONE TABLET BY MOUTH DAILY FOR FOR BLOOD PRESSURE  . aspirin 81 MG tablet Take 81 mg by mouth daily.  Marland Kitchen azelastine (ASTELIN) 0.1 % nasal spray PLACE 2 SPRAYS INTO BOTH NOSTRILS 2 (TWO) TIMES DAILY. USE IN EACH NOSTRIL AS DIRECTED  . Biotin 300 MCG TABS Take 1 tablet by mouth at bedtime.   . CHOLECALCIFEROL PO Take 10,000 Units by mouth daily.  Marland Kitchen escitalopram (LEXAPRO) 20 MG tablet Take 1 tablet Daily for Mood  . famotidine (PEPCID) 40 MG tablet Take 1 tablet at bedtime for Acid Reflux  . fluticasone (FLONASE) 50 MCG/ACT nasal spray SPRAY TWO SPRAYS IN EACH NOSTRIL ONCE DAILY  . hydrochlorothiazide (HYDRODIURIL) 25 MG tablet Take 1 tablet (25 mg total) by mouth daily.  Marland Kitchen latanoprost (XALATAN) 0.005 % ophthalmic solution Place 1 drop into both  eyes at bedtime.  . Magnesium 250 MG TABS Take 1 tablet by mouth 2 (two) times daily.   . meloxicam (MOBIC) 15 MG tablet Take 1/2- 1 tab daily with food as needed for back pain; can take with tylenol, can not take with aleve, iburpofen.  . potassium chloride (K-DUR) 10 MEQ tablet Take 2 tablets Daily  . rosuvastatin (CRESTOR) 40 MG tablet Take 1 tablet Daily for Cholesterol  . promethazine-dextromethorphan (PROMETHAZINE-DM) 6.25-15 MG/5ML syrup TAKE 5 MLS BY MOUTH FOUR TIMES A DAY AS NEEDED FOR COUGH (Patient not taking: Reported on 08/20/2019)  . sulfamethoxazole-trimethoprim (BACTRIM DS) 800-160 MG tablet Take 1 tablet by mouth 2 (two) times daily.   No current facility-administered medications on file prior to visit.     Allergies:  Allergies  Allergen Reactions  . 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 Clinoril [Sulindac] Rash     Medical History:  Past Medical History:  Diagnosis Date  . Allergy   . Anxiety   . Depression   . Esophageal stricture   . GERD (gastroesophageal reflux disease)   .  Hyperlipemia   . Hypertension   . Unspecified vitamin D deficiency    Family history- Reviewed and unchanged Social history- Reviewed and unchanged   Review of Systems:  Review of Systems  Constitutional: Negative for malaise/fatigue and weight loss.  HENT: Negative for congestion, hearing loss, sore throat and tinnitus.   Eyes: Negative for blurred vision and double vision.  Respiratory: Negative for cough (dry cough at night), sputum production, shortness of breath and wheezing.   Cardiovascular: Negative for chest pain, palpitations, orthopnea, claudication and leg swelling.  Gastrointestinal: Negative for abdominal pain, blood in stool, constipation, diarrhea, heartburn, melena, nausea and vomiting.  Genitourinary: Negative.   Musculoskeletal: Negative for joint pain and myalgias.  Skin: Negative for rash.  Neurological: Negative for  dizziness, tingling, sensory change, weakness and headaches.  Endo/Heme/Allergies: Positive for environmental allergies. Negative for polydipsia.  Psychiatric/Behavioral: Negative for depression, substance abuse and suicidal ideas. The patient is not nervous/anxious and does not have insomnia.   All other systems reviewed and are negative.    Physical Exam: BP 124/68   Pulse 75   Temp (!) 97.3 F (36.3 C)   Wt 169 lb (76.7 kg)   SpO2 96%   BMI 30.42 kg/m  Wt Readings from Last 3 Encounters:  11/20/19 169 lb (76.7 kg)  09/29/19 166 lb (75.3 kg)  08/20/19 165 lb (74.8 kg)   General Appearance: Well nourished, in no apparent distress. Eyes: PERRLA, EOMs, conjunctiva no swelling or erythema Sinuses: No Frontal/maxillary tenderness ENT/Mouth: Ext aud canals clear, TMs without erythema, bulging. No erythema, swelling, or exudate on post pharynx.  Tonsils not swollen or erythematous. Hearing normal.  Neck: Supple, thyroid normal.  Respiratory: Respiratory effort normal, BS equal bilaterally without rales, rhonchi, wheezing or stridor.  Cardio: RRR with no MRGs. Brisk peripheral pulses without edema.  Abdomen: Soft, + BS.  Non tender, no guarding, rebound, hernias, masses. Lymphatics: Non tender without lymphadenopathy.  Musculoskeletal: Full ROM, 5/5 strength, Normal gait Skin: Warm, dry without rashes, lesions, ecchymosis.  Neuro: Cranial nerves intact. No cerebellar symptoms.  Psych: Awake and oriented X 3, normal affect, Insight and Judgment appropriate.    Dan MakerAshley C Prestina Raigoza, NP 12:18 PM [facility]

## 2019-11-20 ENCOUNTER — Ambulatory Visit (INDEPENDENT_AMBULATORY_CARE_PROVIDER_SITE_OTHER): Payer: Managed Care, Other (non HMO) | Admitting: Adult Health

## 2019-11-20 ENCOUNTER — Other Ambulatory Visit: Payer: Self-pay

## 2019-11-20 ENCOUNTER — Encounter: Payer: Self-pay | Admitting: Adult Health

## 2019-11-20 VITALS — BP 124/68 | HR 75 | Temp 97.3°F | Wt 169.0 lb

## 2019-11-20 DIAGNOSIS — K219 Gastro-esophageal reflux disease without esophagitis: Secondary | ICD-10-CM

## 2019-11-20 DIAGNOSIS — I10 Essential (primary) hypertension: Secondary | ICD-10-CM

## 2019-11-20 DIAGNOSIS — F3341 Major depressive disorder, recurrent, in partial remission: Secondary | ICD-10-CM

## 2019-11-20 DIAGNOSIS — Z79899 Other long term (current) drug therapy: Secondary | ICD-10-CM | POA: Diagnosis not present

## 2019-11-20 DIAGNOSIS — E782 Mixed hyperlipidemia: Secondary | ICD-10-CM

## 2019-11-20 DIAGNOSIS — R7989 Other specified abnormal findings of blood chemistry: Secondary | ICD-10-CM

## 2019-11-20 DIAGNOSIS — E559 Vitamin D deficiency, unspecified: Secondary | ICD-10-CM

## 2019-11-20 DIAGNOSIS — R7309 Other abnormal glucose: Secondary | ICD-10-CM | POA: Diagnosis not present

## 2019-11-20 DIAGNOSIS — E663 Overweight: Secondary | ICD-10-CM

## 2019-11-20 DIAGNOSIS — K222 Esophageal obstruction: Secondary | ICD-10-CM | POA: Diagnosis not present

## 2019-11-20 MED ORDER — OMEPRAZOLE 20 MG PO CPDR: DELAYED_RELEASE_CAPSULE | ORAL | 0 refills | Status: DC

## 2019-11-20 NOTE — Patient Instructions (Addendum)
Goals    . Blood Pressure < 130/80    . HEMOGLOBIN A1C < 5.7    . LDL CALC < 100    . Weight (lb) < 155 lb (70.3 kg)      Check blood pressure daily - if at goal, can stay off of amlodipine and just do hydrochlorothiazide   Try omeprazole 20 mg daily at night for 2 weeks and see if this improves your cough significantly - let me know if it does   Generally a cough is either coming from above or from below- so we will treat this OR it can be from irritation/viral cough  To treat the nasal drip: Get on the chlorphenirmine every 6 hours- This medication can make you sleepy but helps with nasal drip- get from over the counter.   Can do a steroid nasal spary 1-2 sparys at night each nostril. Remember to spray each nostril twice towards the outer part of your eye.  Do not sniff but instead pinch your nose and tilt your head back to help the medicine get into your sinuses.  The best time to do this is at bedtime. Stop if you get blurred vision or nose bleeds.   To treat the reflux Will send in omeprazole 20 mg to take in the evening and take prevacid from over the counter - let me know if this improves your cough significantly  To stop irritation: Need to STOP the cough Do sugar free candy Do the tessalon drops VOICE REST is VERY important  If not better in 2 weeks will refer to ENT  Go to the ER or call the office if you get any chest pain, shortness of breath, severe  headache, leg swelling.    Common causes of cough OR hoarseness OR sore throat:   Allergies, Viral Infections, Acid Reflux and Bacterial Infections.    Allergies and viral infections cause a cough OR sore throat by post nasal drip and are often worse at night, can also have sneezing, lower grade fevers, clear/yellow mucus. This is best treated with allergy medications or nasal sprays.  Please get on allegra for 1-2 weeks The strongest is allegra or fexafinadine  Cheapest at [employer], [employer], [employer]   Bacterial  infections are more severe than allergies or viral infections with fever, teeth pain, fatigue. This can be treated with prednisone and the same over the counter medication and after 7 days can be treated with an antibiotic.   Silent reflux/GERD can cause a cough OR sore throat OR hoarseness WITHOUT heart burn because the esophagus that goes to the stomach and trachea that goes to the lungs are very close and when you lay down the acid can irritate your throat and lungs. This can cause hoarseness, cough, and wheezing. Please stop any alcohol or anti-inflammatories like aleve/advil/ibuprofen and start an over the counter Prilosec or omeprazole 1-2 times daily 5mins before food for 2 weeks, then switch to over the counter zantac/ratinidine or pepcid/famotadine once at night for 2 weeks.    sometimes irritation causes more irritation. Try voice rest, use sugar free cough drops to prevent coughing, and try to stop clearing your throat.   If you ever have a cough that does not go away after trying these things please make a follow up visit for further evaluation or we can refer you to a specialist. Or if you ever have shortness of breath or chest pain go to the ER.    Silent reflux: Not all heartburn  burns...Marland KitchenMarland KitchenMarland Kitchen  What is LPR? Laryngopharyngeal reflux (LPR) or silent reflux is a condition in which acid that is made in the stomach travels up the esophagus (swallowing tube) and gets to the throat. Not everyone with reflux has a lot of heartburn or indigestion. In fact, many people with LPR never have heartburn. This is why LPR is called SILENT REFLUX, and the terms "Silent reflux" and "LPR" are often used interchangeably. Because LPR is silent, it is sometimes difficult to diagnose.  How can you tell if you have LPR?  Marland Kitchen Chronic hoarseness- Some people have hoarseness that comes and goes . throat clearing  . Cough . It can cause shortness of breath and cause asthma like symptoms. Marland Kitchen a feeling of a lump in  the throat  . difficulty swallowing . a problem with too much nose and throat drainage.  . Some people will feel their esophagus spasm which feels like their heart beating hard and fast, this will usually be after a meal, at rest, or lying down at night.    How do I treat this? Treatment for LPR should be individualized, and your doctor will suggest the best treatment for you. Generally there are several treatments for LPR: . changing habits and diet to reduce reflux,  . medications to reduce stomach acid, and  . surgery to prevent reflux. Most people with LPR need to modify how and when they eat, as well as take some medication, to get well. Sometimes, nonprescription liquid antacids, such as Maalox, Gelucil and Mylanta are recommended. When used, these antacids should be taken four times each day - one tablespoon one hour after each meal and before bedtime. Dietary and lifestyle changes alone are not often enough to control LPR - medications that reduce stomach acid are also usually needed. These must be prescribed by our doctor.   TIPS FOR REDUCING REFLUX AND LPR Control your LIFE-STYLE and your DIET! Marland Kitchen If you use tobacco, QUIT.  Marland Kitchen Smoking makes you reflux. After every cigarette you have some LPR.  . Don't wear clothing that is too tight, especially around the waist (trousers, corsets, belts).  . Do not lie down just after eating...in fact, do not eat within three hours of bedtime.  . You should be on a low-fat diet.  . Limit your intake of red meat.  . Limit your intake of butter.  Marland Kitchen Avoid fried foods.  . Avoid chocolate  . Avoid cheese.  Marland Kitchen Avoid eggs. Marland Kitchen Specifically avoid caffeine (especially coffee and tea), soda pop (especially cola) and mints.  . Avoid alcoholic beverages, particularly in the evening.  \

## 2019-11-21 ENCOUNTER — Ambulatory Visit
Admission: RE | Admit: 2019-11-21 | Discharge: 2019-11-21 | Disposition: A | Discharge status: Home / Self Care | Payer: Managed Care, Other (non HMO) | Source: Ambulatory Visit | Attending: Adult Health | Admitting: Adult Health

## 2019-11-21 ENCOUNTER — Other Ambulatory Visit: Payer: Self-pay | Admitting: Adult Health

## 2019-11-21 DIAGNOSIS — N6489 Other specified disorders of breast: Secondary | ICD-10-CM

## 2019-11-21 DIAGNOSIS — R7989 Other specified abnormal findings of blood chemistry: Secondary | ICD-10-CM

## 2019-11-21 DIAGNOSIS — Z1239 Encounter for other screening for malignant neoplasm of breast: Secondary | ICD-10-CM

## 2019-11-21 LAB — CBC WITH DIFFERENTIAL/PLATELET
Absolute Monocytes: 288 cells/uL (ref 200–950)
Basophils Absolute: 18 cells/uL (ref 0–200)
Basophils Relative: 0.4 %
Eosinophils Absolute: 239 cells/uL (ref 15–500)
Eosinophils Relative: 5.3 %
HCT: 41 % (ref 35.0–45.0)
Hemoglobin: 14.1 g/dL (ref 11.7–15.5)
Lymphs Abs: 2444 cells/uL (ref 850–3900)
MCH: 32.8 pg (ref 27.0–33.0)
MCHC: 34.4 g/dL (ref 32.0–36.0)
MCV: 95.3 fL (ref 80.0–100.0)
MPV: 10.4 fL (ref 7.5–12.5)
Monocytes Relative: 6.4 %
Neutro Abs: 1512 cells/uL (ref 1500–7800)
Neutrophils Relative %: 33.6 %
Platelets: 177 10*3/uL (ref 140–400)
RBC: 4.3 10*6/uL (ref 3.80–5.10)
RDW: 12.5 % (ref 11.0–15.0)
Total Lymphocyte: 54.3 %
WBC: 4.5 10*3/uL (ref 3.8–10.8)

## 2019-11-21 LAB — COMPLETE METABOLIC PANEL WITH GFR
AG Ratio: 1.9 (calc) (ref 1.0–2.5)
ALT: 43 U/L — ABNORMAL HIGH (ref 6–29)
AST: 36 U/L — ABNORMAL HIGH (ref 10–35)
Albumin: 4.4 g/dL (ref 3.6–5.1)
Alkaline phosphatase (APISO): 91 U/L (ref 37–153)
BUN: 16 mg/dL (ref 7–25)
CO2: 28 mmol/L (ref 20–32)
Calcium: 9.6 mg/dL (ref 8.6–10.4)
Chloride: 106 mmol/L (ref 98–110)
Creat: 0.88 mg/dL (ref 0.50–0.99)
GFR, Est African American: 82 mL/min/{1.73_m2} (ref >=60)
GFR, Est Non African American: 71 mL/min/{1.73_m2} (ref >=60)
Globulin: 2.3 g/dL (calc) (ref 1.9–3.7)
Glucose, Bld: 119 mg/dL — ABNORMAL HIGH (ref 65–99)
Potassium: 3.8 mmol/L (ref 3.5–5.3)
Sodium: 142 mmol/L (ref 135–146)
Total Bilirubin: 0.3 mg/dL (ref 0.2–1.2)
Total Protein: 6.7 g/dL (ref 6.1–8.1)

## 2019-11-21 LAB — HEMOGLOBIN A1C
Hgb A1c MFr Bld: 6.2 % of total Hgb — ABNORMAL HIGH (ref <5.7)
Mean Plasma Glucose: 131 (calc)
eAG (mmol/L): 7.3 (calc)

## 2019-11-21 LAB — LIPID PANEL
Cholesterol: 132 mg/dL (ref <200)
HDL: 49 mg/dL — ABNORMAL LOW (ref >=50)
LDL Cholesterol (Calc): 66 mg/dL (calc)
Non-HDL Cholesterol (Calc): 83 mg/dL (calc) (ref <130)
Total CHOL/HDL Ratio: 2.7 (calc) (ref <5.0)
Triglycerides: 90 mg/dL (ref <150)

## 2019-11-21 LAB — MAGNESIUM: Magnesium: 1.9 mg/dL (ref 1.5–2.5)

## 2019-11-21 LAB — TSH: TSH: 0.57 mIU/L (ref 0.40–4.50)

## 2019-12-15 ENCOUNTER — Encounter: Payer: Self-pay | Admitting: Adult Health

## 2019-12-15 ENCOUNTER — Ambulatory Visit
Admission: RE | Admit: 2019-12-15 | Discharge: 2019-12-15 | Disposition: A | Discharge status: Home / Self Care | Payer: Managed Care, Other (non HMO) | Source: Ambulatory Visit | Attending: Adult Health | Admitting: Adult Health

## 2019-12-15 DIAGNOSIS — K760 Fatty (change of) liver, not elsewhere classified: Secondary | ICD-10-CM | POA: Insufficient documentation

## 2019-12-15 DIAGNOSIS — R7989 Other specified abnormal findings of blood chemistry: Secondary | ICD-10-CM

## 2019-12-18 ENCOUNTER — Other Ambulatory Visit: Payer: Self-pay | Admitting: Adult Health

## 2020-02-20 NOTE — Progress Notes (Signed)
FOLLOW UP  Assessment and Plan:   Hypertension Well controlled - discussed addition of ARB due to prediabetes/borderline diabetes with CKD and proteinuria; stop HCTZ, start losartan/HCTZ, reduce amlodipine in 1/2 if BPs running low, will taper off if possible Monitor blood pressure at home; patient to call if consistently greater than 130/80 Continue DASH diet.   Reminder to go to the ER if any CP, SOB, nausea, dizziness, severe HA, changes vision/speech, left arm numbness and tingling and jaw pain.  Cholesterol Currently at goal; continue rosuvastatin 40 mg daily  Continue low cholesterol diet and exercise.  Check lipid panel.   Prediabetes Continue diet and exercise.  Perform daily foot/skin check, notify office of any concerning changes.  Check A1C q49m; monitor weight, serum glucose  Overweight with comorbidities Commended on weight loss progress thus far Long discussion about weight loss, diet, and exercise Recommended diet heavy in fruits and veggies and low in animal meats, cheeses, and dairy products, appropriate calorie intake Discussed ideal weight for height and initial weight goal (<150lb) Start weighing weekly, cut down on snacking at work  Will follow up in 3 months  Vitamin D Def Near goal at last check continue supplementation to maintain goal of 60-100 Defer Vit D level to next CPE  Depression/anxiety Continue medications, lexapro helpful Lifestyle discussed: diet/exerise, sleep hygiene, stress management, hydration  GERD/ hx of esophageal stricture Continue ompeprazole; had breakthrough sx with famotidine  Discussed diet, avoiding triggers and other lifestyle changes  Allergic rhinitis Hygiene discussed, continue zyrtec, add PM PRN nasal sprays astalin/fluticasone   Fatty liver Weight loss advised, avoid alcohol/tylenol, will monitor LFTs   Continue diet and meds as discussed. Further disposition pending results of labs. Discussed med's effects and  SE's.   Over 30 minutes of exam, counseling, chart review, and critical decision making was performed.   Future Appointments  Date Time Provider Department Center  08/19/2020  9:00 AM Judd Gaudier, NP [facility] None    ----------------------------------------------------------------------------------------------------------------------  HPI 64 y.o. female  presents for 3 month follow up on hypertension, cholesterol, prediabetes, weight, GERD, anxiety and vitamin D deficiency.   She had covid 19 in Feb 2021, reports has recovered without sequela.   she has a diagnosis of recurrent depression/anxiety and is currently on lexapro 20 mg daily, reports symptoms are well controlled on current regimen. She reports can tell when she misses her medication.   She does not have formal diagnosis of asthma but is albuterol for reports of wheezing with seasonal changes which she takes intermittently as needed and finds very beneficial though doing well at this time. She is taking zyrtec daily. Hasn't been doing nasal sprays, does report recent nocturnal postnasal drip.    She has esophageal reflux and stricture (dilation in 2015), was on famotidine 40 mg daily, was having cough at night, omprazole 20 mg daily was added and reports improved sx.  Denies current reflux symptoms, burning, hoarseness.   BMI is Body mass index is 29.27 kg/m., she is working on diet though, had poor appetite though covid 19, still eating less. Goal weight is 140 lb.  Wt Readings from Last 3 Encounters:  02/23/20 162 lb 9.6 oz (73.8 kg)  11/20/19 169 lb (76.7 kg)  09/29/19 166 lb (75.3 kg)   She admits hasn't been checking BP at home, does have a cuff, today their BP is BP: 124/78  proteinuria/microalbuminuria with prediabetes, has not been on ACE/ARB, currently taking hctz and amlodipine   She does not workout. She denies  chest pain, shortness of breath, dizziness.   She is on cholesterol medication Rosuvastatin 40 mg  daily and denies myalgias. Her cholesterol is at goal. The cholesterol last visit was:   Lab Results  Component Value Date   CHOL 132 11/20/2019   HDL 49 (L) 11/20/2019   LDLCALC 66 11/20/2019   TRIG 90 11/20/2019   CHOLHDL 2.7 11/20/2019    She has been working on diet and exercise for prediabetes, and denies increased appetite, nausea, paresthesia of the feet, polydipsia, polyuria and visual disturbances. Last A1C in the office was:  Lab Results  Component Value Date   HGBA1C 6.2 (H) 11/20/2019   Patient is on Vitamin D supplement.   Lab Results  Component Value Date   VD25OH 73 08/20/2019     Lab Results  Component Value Date   GFRAA 82 11/20/2019   She has new mild LFT elevations last year; hepatitis panel was negative 08/20/2019, [place] 12/2019 showed diffuse hepatic steatosis;  Lab Results  Component Value Date   ALT 43 (H) 11/20/2019   AST 36 (H) 11/20/2019   ALKPHOS 81 06/28/2017   BILITOT 0.3 11/20/2019     Current Medications:  Current Outpatient Medications on File Prior to Visit  Medication Sig  . albuterol (VENTOLIN HFA) 108 (90 Base) MCG/ACT inhaler Inhale 2 puffs into the lungs every 4 (four) hours as needed for wheezing or shortness of breath.  Marland Kitchen amLODipine (NORVASC) 10 MG tablet TAKE ONE TABLET BY MOUTH DAILY FOR FOR BLOOD PRESSURE  . aspirin 81 MG tablet Take 81 mg by mouth daily.  Marland Kitchen azelastine (ASTELIN) 0.1 % nasal spray PLACE 2 SPRAYS INTO BOTH NOSTRILS 2 (TWO) TIMES DAILY. USE IN EACH NOSTRIL AS DIRECTED  . Biotin 300 MCG TABS Take 1 tablet by mouth at bedtime.   . CHOLECALCIFEROL PO Take 10,000 Units by mouth daily.  Marland Kitchen escitalopram (LEXAPRO) 20 MG tablet Take 1 tablet Daily for Mood  . famotidine (PEPCID) 40 MG tablet Take 1 tablet at bedtime for Acid Reflux  . fluticasone (FLONASE) 50 MCG/ACT nasal spray SPRAY TWO SPRAYS IN EACH NOSTRIL ONCE DAILY  . hydrochlorothiazide (HYDRODIURIL) 25 MG tablet Take 1 tablet (25 mg total) by mouth daily.  Marland Kitchen latanoprost  (XALATAN) 0.005 % ophthalmic solution Place 1 drop into both eyes at bedtime.  . Magnesium 250 MG TABS Take 1 tablet by mouth 2 (two) times daily.   . meloxicam (MOBIC) 15 MG tablet Take 1/2- 1 tab daily with food as needed for back pain; can take with tylenol, can not take with aleve, iburpofen.  Marland Kitchen omeprazole (PRILOSEC) 20 MG capsule TAKE ONE CAPSULE BY MOUTH ONE HOUR PRIOR TO BEDTIME FOR 2 WEEKS, THEN AS NEEDED  . potassium chloride (K-DUR) 10 MEQ tablet Take 2 tablets Daily  . promethazine-dextromethorphan (PROMETHAZINE-DM) 6.25-15 MG/5ML syrup TAKE 5 MLS BY MOUTH FOUR TIMES A DAY AS NEEDED FOR COUGH  . rosuvastatin (CRESTOR) 40 MG tablet Take 1 tablet Daily for Cholesterol   No current facility-administered medications on file prior to visit.     Allergies:  Allergies  Allergen Reactions  . 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 Clinoril [Sulindac] Rash     Medical History:  Past Medical History:  Diagnosis Date  . Allergy   . Anxiety   . Depression   . Esophageal stricture   . GERD (gastroesophageal reflux disease)   . Hyperlipemia   . Hypertension   .  Unspecified vitamin D deficiency    Family history- Reviewed and unchanged Social history- Reviewed and unchanged   Review of Systems:  Review of Systems  Constitutional: Negative for malaise/fatigue and weight loss.  HENT: Negative for congestion, hearing loss, sore throat and tinnitus.   Eyes: Negative for blurred vision and double vision.  Respiratory: Positive for cough (dry cough at night). Negative for sputum production, shortness of breath and wheezing.   Cardiovascular: Negative for chest pain, palpitations, orthopnea, claudication and leg swelling.  Gastrointestinal: Negative for abdominal pain, blood in stool, constipation, diarrhea, heartburn, melena, nausea and vomiting.  Genitourinary: Negative.   Musculoskeletal: Negative for joint pain and myalgias.  Skin:  Negative for rash.  Neurological: Negative for dizziness, tingling, sensory change, weakness and headaches.  Endo/Heme/Allergies: Positive for environmental allergies. Negative for polydipsia.  Psychiatric/Behavioral: Negative for depression, substance abuse and suicidal ideas. The patient is not nervous/anxious and does not have insomnia.   All other systems reviewed and are negative.    Physical Exam: BP 124/78   Pulse 76   Temp (!) 97.3 F (36.3 C)   Wt 162 lb 9.6 oz (73.8 kg)   SpO2 97%   BMI 29.27 kg/m  Wt Readings from Last 3 Encounters:  02/23/20 162 lb 9.6 oz (73.8 kg)  11/20/19 169 lb (76.7 kg)  09/29/19 166 lb (75.3 kg)   General Appearance: Well nourished, in no apparent distress. Eyes: PERRLA, EOMs, conjunctiva no swelling or erythema Sinuses: No Frontal/maxillary tenderness ENT/Mouth: Ext aud canals clear, TMs without erythema, bulging. No erythema, swelling, or exudate on post pharynx.  Tonsils not swollen or erythematous. Hearing normal.  Neck: Supple, thyroid normal.  Respiratory: Respiratory effort normal, BS equal bilaterally without rales, rhonchi, wheezing or stridor.  Cardio: RRR with no MRGs. Brisk peripheral pulses without edema.  Abdomen: Soft, + BS.  Non tender, no guarding, rebound, hernias, masses. Lymphatics: Non tender without lymphadenopathy.  Musculoskeletal: Full ROM, 5/5 strength, Normal gait Skin: Warm, dry without rashes, lesions, ecchymosis.  Neuro: Cranial nerves intact. No cerebellar symptoms.  Psych: Awake and oriented X 3, normal affect, Insight and Judgment appropriate.    Izora Ribas, NP 11:36 AM [facility]

## 2020-02-23 ENCOUNTER — Other Ambulatory Visit: Payer: Self-pay

## 2020-02-23 ENCOUNTER — Encounter: Payer: Self-pay | Admitting: Adult Health

## 2020-02-23 ENCOUNTER — Ambulatory Visit (INDEPENDENT_AMBULATORY_CARE_PROVIDER_SITE_OTHER): Payer: Managed Care, Other (non HMO) | Admitting: Adult Health

## 2020-02-23 VITALS — BP 124/78 | HR 76 | Temp 97.3°F | Wt 162.6 lb

## 2020-02-23 DIAGNOSIS — F3341 Major depressive disorder, recurrent, in partial remission: Secondary | ICD-10-CM

## 2020-02-23 DIAGNOSIS — K760 Fatty (change of) liver, not elsewhere classified: Secondary | ICD-10-CM | POA: Diagnosis not present

## 2020-02-23 DIAGNOSIS — Z79899 Other long term (current) drug therapy: Secondary | ICD-10-CM | POA: Diagnosis not present

## 2020-02-23 DIAGNOSIS — E782 Mixed hyperlipidemia: Secondary | ICD-10-CM

## 2020-02-23 DIAGNOSIS — E559 Vitamin D deficiency, unspecified: Secondary | ICD-10-CM

## 2020-02-23 DIAGNOSIS — K219 Gastro-esophageal reflux disease without esophagitis: Secondary | ICD-10-CM | POA: Diagnosis not present

## 2020-02-23 DIAGNOSIS — R7309 Other abnormal glucose: Secondary | ICD-10-CM

## 2020-02-23 DIAGNOSIS — I10 Essential (primary) hypertension: Secondary | ICD-10-CM

## 2020-02-23 DIAGNOSIS — M4050 Lordosis, unspecified, site unspecified: Secondary | ICD-10-CM

## 2020-02-23 DIAGNOSIS — E663 Overweight: Secondary | ICD-10-CM

## 2020-02-23 DIAGNOSIS — Z8616 Personal history of COVID-19: Secondary | ICD-10-CM

## 2020-02-23 HISTORY — DX: Personal history of COVID-19: Z86.16

## 2020-02-23 MED ORDER — LOSARTAN POTASSIUM-HCTZ 50-12.5 MG PO TABS: 1.0000 | ORAL_TABLET | Freq: Every day | ORAL | 1 refills | Status: DC

## 2020-02-23 MED ORDER — MELOXICAM 15 MG PO TABS: ORAL_TABLET | ORAL | 0 refills | Status: DC

## 2020-02-23 NOTE — Patient Instructions (Addendum)
Goals    . Blood Pressure < 130/80    . HEMOGLOBIN A1C < 5.7    . LDL CALC < 100    . Weight (lb) < 155 lb (70.3 kg)      Cough  I think your cough is either from acid reflux or allergies  If cough persists, try doing nose spray at night for allergies - astalin and/or fluticasone/flonase, Then try cutting back on omeprazole (added to see if would help - if stopping this makes cough worse, then acid reflux may be causing cough - cut back gradually, every other day for a few weeks, etc rather than stopping abruptly). Please keep a log of what medications you are doing and if you can tell what is helping with cough more.    Blood pressure  We are adding on a medication - losartan - to help protect your kidneys from prediabetes and also due to protein in your urine  STOP hydrochlorothiazide - start combo pill losartan/hydrochlorothiazide instead  Helps protect kidneys  IF blood pressure is running low (<110/70) then cut amlodipine in 1/2 - we will get off of this medication if possible  Please check blood pressures regularly at home    Proteinuria Proteinuria is when there is too much protein in the urine. Proteins are important for building muscles and bones. Proteins are also needed to fight infections, help the blood to clot, and keep body fluids in balance. Proteinuria may be mild and temporary, or it may be an early sign of kidney disease. The kidneys make urine. Healthy kidneys also keep substances like proteins from leaving the blood and ending up in the urine. What are the causes? This condition may be caused by damage to the kidneys or by temporary causes such as fever or stress. Proteinuria may happen when the kidneys are not working well. Healthy kidneys have filters (glomeruli) that keep proteins out of the urine. Proteinuria may mean that the glomeruli are damaged. The main causes of this type of damage are:  Diabetes.  High blood pressure. Other causes of kidney damage  can also cause proteinuria, such as:  Diseases of the immune system, such as lupus, rheumatoid arthritis, sarcoidosis, and Goodpasture syndrome.  Heart disease or heart failure.  Kidney infection.  Certain cancers, including kidney cancer, lymphoma, leukemia, and multiple myeloma.  Amyloidosis. This is a disease that causes abnormal proteins to build up in body tissues.  Reactions to certain medicines, such as NSAIDs.  Injuries or poisons (toxins).  High blood pressure that occurs during pregnancy (preeclampsia and eclampsia). Temporary proteinuria may result from conditions that put stress on the kidneys. These conditions usually do not cause kidney damage. They include:  Fever.  Exposure to cold or heat.  Emotional or physical stress.  Extreme exercise.  Standing for long periods of time. What increases the risk? You are more likely to develop this condition if you:  Have diabetes.  Have high blood pressure.  Have heart disease or heart failure.  Have an immune disease, cancer, or other disease that affects the kidneys.  Have a family history of kidney disease.  Are 63 years of age or older.  Are overweight.  Are of African American, American [place], Hispanic/Latino, or [place] descent.  Are pregnant.  Have an infection. What are the signs or symptoms? Mild proteinuria may not cause symptoms. As more proteins enter the urine, symptoms of kidney disease may develop, such as:  Foamy urine.  Swelling of the face, abdomen,  hands, legs, or feet (edema).  Needing to urinate frequently.  Fatigue.  Difficulty sleeping.  Dry and itchy skin.  Nausea and vomiting.  Muscle cramps.  Shortness of breath. How is this diagnosed? This condition may be diagnosed with a urine test. You may have this test as part of a routine physical exam or because you have symptoms of kidney disease or risk factors for kidney disease. You may also have:  Blood tests  to measure the level of a certain substance (creatinine) that increases with kidney disease.  Imaging tests of your kidney, such as a CT scan or an ultrasound, to look for signs of kidney damage. How is this treated? If your proteinuria is mild or temporary, treatment may not be needed for this condition. Your health care provider may show you how to monitor the level of protein in your urine at home. Identifying proteinuria early is important so that the cause of the condition can be treated. Treatment for this condition depends on the cause of your proteinuria. Treatment may include:  Making diet and lifestyle changes.  Getting blood pressure under control.  Getting blood sugar under control, if you have diabetes.  Managing any other medical conditions you have that affect your kidneys.  Giving birth, if you are pregnant.  Avoiding medicines that damage your kidneys. In severe cases, kidney disease may need to be treated with medicines or dialysis. Follow these instructions at home: Activity  Return to your normal activities as told by your health care provider. Ask your health care provider what activities are safe for you.  Ask your health care provider to recommend an exercise program. General instructions  Check your protein levels at home if directed by your health care provider.  Follow instructions from your health care provider about eating or drinking restrictions.  If you are overweight, ask your health care provider about diets that can help you get to a healthy weight.  Take over-the-counter and prescription medicines only as told by your health care provider.  Keep all follow-up visits as told by your health care provider. This is important. Contact a health care provider if:  You have new symptoms.  Your symptoms get worse or do not improve. Get help right away if you:  Have back pain.  Have diarrhea.  Vomit.  Have a fever.  Have a  rash. Summary  Proteinuria is when there is too much protein in the urine.  Proteinuria may be mild and temporary, or it may be an early sign of kidney disease.  This condition may be diagnosed with a urine test.  Treatment for this condition depends on the cause of your proteinuria.  Treatment may include diet and lifestyle changes, blood pressure and blood sugar management, and avoiding medicines that may damage the kidneys. If the proteinuria is severe, it may need to be treated with medicines or dialysis. This information is not intended to replace advice given to you by your health care provider. Make sure you discuss any questions you have with your health care provider. Document Revised: 07/15/2018 Document Reviewed: 07/15/2018 Elsevier Patient Education  [place].

## 2020-02-24 ENCOUNTER — Other Ambulatory Visit: Payer: Self-pay | Admitting: Adult Health

## 2020-02-24 DIAGNOSIS — N289 Disorder of kidney and ureter, unspecified: Secondary | ICD-10-CM

## 2020-02-24 DIAGNOSIS — R7989 Other specified abnormal findings of blood chemistry: Secondary | ICD-10-CM

## 2020-02-24 DIAGNOSIS — R7889 Finding of other specified substances, not normally found in blood: Secondary | ICD-10-CM | POA: Diagnosis not present

## 2020-02-25 LAB — LIPID PANEL
Cholesterol: 139 mg/dL (ref <200)
HDL: 43 mg/dL — ABNORMAL LOW (ref >=50)
LDL Cholesterol (Calc): 69 mg/dL (calc)
Non-HDL Cholesterol (Calc): 96 mg/dL (calc) (ref <130)
Total CHOL/HDL Ratio: 3.2 (calc) (ref <5.0)
Triglycerides: 196 mg/dL — ABNORMAL HIGH (ref <150)

## 2020-02-25 LAB — COMPLETE METABOLIC PANEL WITH GFR
AG Ratio: 2 (calc) (ref 1.0–2.5)
ALT: 102 U/L — ABNORMAL HIGH (ref 6–29)
AST: 53 U/L — ABNORMAL HIGH (ref 10–35)
Albumin: 4.3 g/dL (ref 3.6–5.1)
Alkaline phosphatase (APISO): 89 U/L (ref 37–153)
BUN/Creatinine Ratio: 15 (calc) (ref 6–22)
BUN: 16 mg/dL (ref 7–25)
CO2: 30 mmol/L (ref 20–32)
Calcium: 9.4 mg/dL (ref 8.6–10.4)
Chloride: 106 mmol/L (ref 98–110)
Creat: 1.09 mg/dL — ABNORMAL HIGH (ref 0.50–0.99)
GFR, Est African American: 63 mL/min/{1.73_m2} (ref >=60)
GFR, Est Non African American: 54 mL/min/{1.73_m2} — ABNORMAL LOW (ref >=60)
Globulin: 2.2 g/dL (calc) (ref 1.9–3.7)
Glucose, Bld: 98 mg/dL (ref 65–99)
Potassium: 3.7 mmol/L (ref 3.5–5.3)
Sodium: 143 mmol/L (ref 135–146)
Total Bilirubin: 0.3 mg/dL (ref 0.2–1.2)
Total Protein: 6.5 g/dL (ref 6.1–8.1)

## 2020-02-25 LAB — CBC WITH DIFFERENTIAL/PLATELET
Absolute Monocytes: 308 cells/uL (ref 200–950)
Basophils Absolute: 9 cells/uL (ref 0–200)
Basophils Relative: 0.2 %
Eosinophils Absolute: 129 cells/uL (ref 15–500)
Eosinophils Relative: 2.8 %
HCT: 44.6 % (ref 35.0–45.0)
Hemoglobin: 15.4 g/dL (ref 11.7–15.5)
Lymphs Abs: 2075 cells/uL (ref 850–3900)
MCH: 32.8 pg (ref 27.0–33.0)
MCHC: 34.5 g/dL (ref 32.0–36.0)
MCV: 95.1 fL (ref 80.0–100.0)
MPV: 11 fL (ref 7.5–12.5)
Monocytes Relative: 6.7 %
Neutro Abs: 2079 cells/uL (ref 1500–7800)
Neutrophils Relative %: 45.2 %
Platelets: 189 10*3/uL (ref 140–400)
RBC: 4.69 10*6/uL (ref 3.80–5.10)
RDW: 12.1 % (ref 11.0–15.0)
Total Lymphocyte: 45.1 %
WBC: 4.6 10*3/uL (ref 3.8–10.8)

## 2020-02-25 LAB — IRON,TIBC AND FERRITIN PANEL
%SAT: 26 % (calc) (ref 16–45)
Ferritin: 50 ng/mL (ref 16–288)
Iron: 73 ug/dL (ref 45–160)
TIBC: 283 mcg/dL (calc) (ref 250–450)

## 2020-02-25 LAB — TEST AUTHORIZATION

## 2020-02-25 LAB — HEMOGLOBIN A1C
Hgb A1c MFr Bld: 6 % of total Hgb — ABNORMAL HIGH (ref <5.7)
Mean Plasma Glucose: 126 (calc)
eAG (mmol/L): 7 (calc)

## 2020-02-25 LAB — MAGNESIUM: Magnesium: 2.1 mg/dL (ref 1.5–2.5)

## 2020-02-25 LAB — TSH: TSH: 1.62 mIU/L (ref 0.40–4.50)

## 2020-03-18 ENCOUNTER — Other Ambulatory Visit: Payer: Self-pay | Admitting: Adult Health

## 2020-03-21 ENCOUNTER — Other Ambulatory Visit: Payer: Self-pay | Admitting: Adult Health

## 2020-03-23 ENCOUNTER — Other Ambulatory Visit: Payer: Self-pay

## 2020-03-29 ENCOUNTER — Other Ambulatory Visit: Payer: Self-pay

## 2020-03-29 ENCOUNTER — Other Ambulatory Visit: Payer: Managed Care, Other (non HMO)

## 2020-03-29 ENCOUNTER — Telehealth: Payer: Self-pay

## 2020-03-29 ENCOUNTER — Other Ambulatory Visit: Payer: Self-pay | Admitting: Adult Health

## 2020-03-29 DIAGNOSIS — R7989 Other specified abnormal findings of blood chemistry: Secondary | ICD-10-CM | POA: Diagnosis not present

## 2020-03-29 DIAGNOSIS — N289 Disorder of kidney and ureter, unspecified: Secondary | ICD-10-CM | POA: Diagnosis not present

## 2020-03-29 LAB — COMPLETE METABOLIC PANEL WITH GFR
AG Ratio: 1.8 (calc) (ref 1.0–2.5)
ALT: 41 U/L — ABNORMAL HIGH (ref 6–29)
AST: 32 U/L (ref 10–35)
Albumin: 4.2 g/dL (ref 3.6–5.1)
Alkaline phosphatase (APISO): 93 U/L (ref 37–153)
BUN/Creatinine Ratio: 17 (calc) (ref 6–22)
BUN: 17 mg/dL (ref 7–25)
CO2: 28 mmol/L (ref 20–32)
Calcium: 9.7 mg/dL (ref 8.6–10.4)
Chloride: 106 mmol/L (ref 98–110)
Creat: 1.02 mg/dL — ABNORMAL HIGH (ref 0.50–0.99)
GFR, Est African American: 68 mL/min/{1.73_m2} (ref >=60)
GFR, Est Non African American: 59 mL/min/{1.73_m2} — ABNORMAL LOW (ref >=60)
Globulin: 2.4 g/dL (calc) (ref 1.9–3.7)
Glucose, Bld: 94 mg/dL (ref 65–99)
Potassium: 3.6 mmol/L (ref 3.5–5.3)
Sodium: 142 mmol/L (ref 135–146)
Total Bilirubin: 0.4 mg/dL (ref 0.2–1.2)
Total Protein: 6.6 g/dL (ref 6.1–8.1)

## 2020-03-29 MED ORDER — GABAPENTIN 100 MG PO CAPS: 100.0000 mg | ORAL_CAPSULE | Freq: Three times a day (TID) | ORAL | 0 refills | Status: DC | PRN

## 2020-03-29 NOTE — Telephone Encounter (Signed)
Reminder message for a prescription for Gabapentin instead of the Meloxicam and BC powder.

## 2020-03-30 NOTE — Telephone Encounter (Signed)
Left patient a detailed message on voicemail. 

## 2020-04-10 ENCOUNTER — Other Ambulatory Visit: Payer: Self-pay | Admitting: Adult Health

## 2020-04-27 ENCOUNTER — Other Ambulatory Visit: Payer: Self-pay | Admitting: Internal Medicine

## 2020-04-29 ENCOUNTER — Other Ambulatory Visit: Payer: Self-pay | Admitting: Physician Assistant

## 2020-05-04 ENCOUNTER — Ambulatory Visit (INDEPENDENT_AMBULATORY_CARE_PROVIDER_SITE_OTHER): Payer: Managed Care, Other (non HMO) | Admitting: Adult Health Nurse Practitioner

## 2020-05-04 ENCOUNTER — Other Ambulatory Visit: Payer: Self-pay

## 2020-05-04 ENCOUNTER — Encounter: Payer: Self-pay | Admitting: Adult Health Nurse Practitioner

## 2020-05-04 VITALS — BP 110/76 | HR 74 | Temp 97.3°F | Wt 152.2 lb

## 2020-05-04 DIAGNOSIS — R140 Abdominal distension (gaseous): Secondary | ICD-10-CM | POA: Diagnosis not present

## 2020-05-04 DIAGNOSIS — R1031 Right lower quadrant pain: Secondary | ICD-10-CM | POA: Diagnosis not present

## 2020-05-04 LAB — CBC WITH DIFFERENTIAL/PLATELET
Absolute Monocytes: 686 cells/uL (ref 200–950)
Basophils Absolute: 29 cells/uL (ref 0–200)
Basophils Relative: 0.4 %
Eosinophils Absolute: 168 cells/uL (ref 15–500)
Eosinophils Relative: 2.3 %
HCT: 41.2 % (ref 35.0–45.0)
Hemoglobin: 13.9 g/dL (ref 11.7–15.5)
Lymphs Abs: 3657 cells/uL (ref 850–3900)
MCH: 32.6 pg (ref 27.0–33.0)
MCHC: 33.7 g/dL (ref 32.0–36.0)
MCV: 96.7 fL (ref 80.0–100.0)
MPV: 10.6 fL (ref 7.5–12.5)
Monocytes Relative: 9.4 %
Neutro Abs: 2759 cells/uL (ref 1500–7800)
Neutrophils Relative %: 37.8 %
Platelets: 161 10*3/uL (ref 140–400)
RBC: 4.26 10*6/uL (ref 3.80–5.10)
RDW: 13 % (ref 11.0–15.0)
Total Lymphocyte: 50.1 %
WBC: 7.3 10*3/uL (ref 3.8–10.8)

## 2020-05-04 LAB — COMPLETE METABOLIC PANEL WITH GFR
AG Ratio: 2 (calc) (ref 1.0–2.5)
ALT: 28 U/L (ref 6–29)
AST: 24 U/L (ref 10–35)
Albumin: 4.5 g/dL (ref 3.6–5.1)
Alkaline phosphatase (APISO): 87 U/L (ref 37–153)
BUN/Creatinine Ratio: 16 (calc) (ref 6–22)
BUN: 20 mg/dL (ref 7–25)
CO2: 30 mmol/L (ref 20–32)
Calcium: 9.7 mg/dL (ref 8.6–10.4)
Chloride: 105 mmol/L (ref 98–110)
Creat: 1.23 mg/dL — ABNORMAL HIGH (ref 0.50–0.99)
GFR, Est African American: 54 mL/min/{1.73_m2} — ABNORMAL LOW (ref >=60)
GFR, Est Non African American: 47 mL/min/{1.73_m2} — ABNORMAL LOW (ref >=60)
Globulin: 2.2 g/dL (calc) (ref 1.9–3.7)
Glucose, Bld: 78 mg/dL (ref 65–99)
Potassium: 4.2 mmol/L (ref 3.5–5.3)
Sodium: 143 mmol/L (ref 135–146)
Total Bilirubin: 0.4 mg/dL (ref 0.2–1.2)
Total Protein: 6.7 g/dL (ref 6.1–8.1)

## 2020-05-04 MED ORDER — DICYCLOMINE HCL 10 MG PO CAPS: 10.0000 mg | ORAL_CAPSULE | Freq: Four times a day (QID) | ORAL | 0 refills | Status: DC

## 2020-05-04 NOTE — Progress Notes (Signed)
Assessment and Plan:  Kaitlyn [name] was seen today for abdominal pain.  Diagnoses and all orders for this visit:  Right lower quadrant abdominal pain Concern for appendicitis related to physical findings, discussed with patient, discussed hospital precautions, does not want to go to hospital at this time. Rule out appedicitis, CT w/o contrast ordered -     CBC with Differential/Platelet -     COMPLETE METABOLIC PANEL WITH GFR -     Cancel: CT Abdomen Pelvis Wo Contrast; Future -     CT Abdomen Pelvis Wo Contrast; Future  Abdominal bloating Discussed OTC Gas-X -     dicyclomine (BENTYL) 10 MG capsule; Take 1 capsule (10 mg total) by mouth 4 (four) times daily for 14 days.    Further disposition pending results of labs. Discussed med's effects and SE's.   Over 30 minutes of face to face interview exam, counseling, chart review, and critical decision making was performed.   Future Appointments  Date Time Provider [place]  05/27/2020  4:15 PM Liane Comber, NP [facility] None  08/19/2020  9:00 AM Liane Comber, NP [facility] None    ------------------------------------------------------------------------------------------------------------------   HPI 64 y.o.female presents for abdominal pain that started yesterday evening. She reports that it has been constant pain she rates it a 10/10 pain.  She reports when she is sitting it feels the best, 8/10.  Walking causes increase in the pain and discomfort.  It is constant and she does report a change in quality of this.  He denies excessive burping or flatulence.  Her LBM this am that were both type 6/7.  She reports typically she has formed stools with bowel movements daily.  She has not had diarrhea before in the past.  She is taking PPI, famotadine daily for acid and reports this is well controlled.  Reports decrease in appetite but her pain is not altered by food or drink intake.  She denies any nausea, vomiting, hematochezia, melana,  dysuria, abnormal uterine bleeding.  Colonoscopy in 2011, no abnormalities or diverticula noted, due this year.    Past Medical History:  Diagnosis Date  . Allergy   . Anxiety   . Depression   . Esophageal stricture   . GERD (gastroesophageal reflux disease)   . Hyperlipemia   . Hypertension   . Unspecified vitamin D deficiency      Allergies  Allergen Reactions  . 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 Clinoril [Sulindac] Rash    Current Outpatient Medications on File Prior to Visit  Medication Sig  . albuterol (VENTOLIN HFA) 108 (90 Base) MCG/ACT inhaler Inhale 2 puffs into the lungs every 4 (four) hours as needed for wheezing or shortness of breath.  Marland Kitchen aspirin 81 MG tablet Take 81 mg by mouth daily.  Marland Kitchen azelastine (ASTELIN) 0.1 % nasal spray PLACE 2 SPRAYS INTO BOTH NOSTRILS 2 (TWO) TIMES DAILY. USE IN EACH NOSTRIL AS DIRECTED  . Biotin 300 MCG TABS Take 1 tablet by mouth at bedtime.   . CHOLECALCIFEROL PO Take 10,000 Units by mouth daily.  Marland Kitchen escitalopram (LEXAPRO) 20 MG tablet Take 1 tablet Daily for Mood  . fluticasone (FLONASE) 50 MCG/ACT nasal spray SPRAY TWO SPRAYS IN EACH NOSTRIL ONCE DAILY  . gabapentin (NEURONTIN) 100 MG capsule TAKE ONE TO THREE CAPSULES BY MOUTH THREE TIMES DAILY AS NEEDED FOR PAIN  . latanoprost (XALATAN) 0.005 % ophthalmic solution Place 1 drop into both eyes at bedtime.  Marland Kitchen  losartan-hydrochlorothiazide (HYZAAR) 50-12.5 MG tablet Take 1 tablet by mouth daily.  . Magnesium 250 MG TABS Take 1 tablet by mouth 2 (two) times daily.   . potassium chloride (KLOR-CON) 10 MEQ tablet TAKE TWO TABLETS BY MOUTH DAILY FOR POTASSIUM  . promethazine-dextromethorphan (PROMETHAZINE-DM) 6.25-15 MG/5ML syrup TAKE 5 MLS BY MOUTH FOUR TIMES A DAY AS NEEDED FOR COUGH  . rosuvastatin (CRESTOR) 40 MG tablet Take 1 tablet Daily for Cholesterol  . amLODipine (NORVASC) 10 MG tablet TAKE ONE TABLET BY MOUTH DAILY FOR FOR BLOOD PRESSURE  (Patient not taking: Reported on 05/04/2020)  . famotidine (PEPCID) 40 MG tablet Take 1 tablet at bedtime for Acid Reflux (Patient not taking: Reported on 05/04/2020)  . meloxicam (MOBIC) 15 MG tablet Take 1/2- 1 tab daily with food as needed for back pain; can take with tylenol, can not take with aleve, iburpofen. (Patient not taking: Reported on 05/04/2020)  . omeprazole (PRILOSEC) 20 MG capsule TAKE ONE CAPSULE BY MOUTH ONE HOUR PRIOR TO BEDTIME FOR 2 WEEKS, THEN TAKE AS NEEDED (Patient not taking: Reported on 05/04/2020)   No current facility-administered medications on file prior to visit.    ROS: all negative except above.   Physical Exam:  BP 110/76   Pulse 74   Temp (!) 97.3 F (36.3 C)   Wt 152 lb 3.2 oz (69 kg)   SpO2 98%   BMI 27.39 kg/m   General Appearance: Well nourished, in no apparent distress. Eyes: PERRLA, EOMs, conjunctiva no swelling or erythema Sinuses: No Frontal/maxillary tenderness ENT/Mouth: Ext aud canals clear, TMs without erythema, bulging. No erythema, swelling, or exudate on post pharynx.  Tonsils not swollen or erythematous. Hearing normal.  Respiratory: Respiratory effort normal, BS equal bilaterally without rales, rhonchi, wheezing or stridor.  Cardio: RRR with no MRGs. Brisk peripheral pulses without edema.  Abdomen: Soft, distended, + hyperactive BS.  Generalized abdominal tenderness.  Positive rebound tenderness on right and positive Mcburney's point.  No hernias, masses. Lymphatics: Non tender without lymphadenopathy.  Musculoskeletal: Full ROM, 5/5 strength, normal gait.  Skin: Warm, dry without rashes, lesions, ecchymosis.  Neuro: Cranial nerves intact. Normal muscle tone, no cerebellar symptoms. Sensation intact.  Psych: Awake and oriented X 3, normal affect, Insight and Judgment appropriate.     Elder Negus, NP 4:00 PM [facility]

## 2020-05-04 NOTE — Patient Instructions (Signed)
   We are placing order for CT of the abdomin, we will contact you to schedule after approval from insurance company.   Try over the counter Gas-X.  You can get this at any pharmacy.  Take before meals and as needed for gas, bloating.  Sent in a prescription for dicyclomine.  This helps with gas bloating and cramping.  Take this right before you eat a meal.   Please seek immediate attention if you have a fever, severe pain that you are not able to stand or walk, or nausea vomiting.   Get MyChart code at check out.

## 2020-05-05 ENCOUNTER — Telehealth: Payer: Self-pay | Admitting: Adult Health Nurse Practitioner

## 2020-05-05 NOTE — Telephone Encounter (Signed)
call patient and advised Stat CT AB/[place] w/o cm has been authorized, please proceed to [facility]. Patient states she can't go till Tuesday 05/11/2020. Gave patient # to call and schedule. Asked patient how she is doing and pain level. Patient states she took a pill Kyra gave her last night, this morning at 6am she took a [facility] Powder Pill, now pain is 4/10. She does want to get the CT, so she will call to schedule. The patient was advised to call immediately if she has any concerning symptoms in the interval. The patient voices understanding of current treatment options and is in agreement with the current care plan.The patient knows to call the clinic with any problems, questions or concerns or go to the ER if any further progression of symptoms.

## 2020-05-14 ENCOUNTER — Other Ambulatory Visit: Payer: Self-pay | Admitting: Adult Health

## 2020-05-20 ENCOUNTER — Other Ambulatory Visit: Payer: Self-pay | Admitting: Adult Health

## 2020-05-20 ENCOUNTER — Other Ambulatory Visit: Payer: Self-pay | Admitting: Internal Medicine

## 2020-05-27 ENCOUNTER — Ambulatory Visit: Payer: Managed Care, Other (non HMO) | Admitting: Adult Health

## 2020-05-31 ENCOUNTER — Other Ambulatory Visit: Payer: Self-pay

## 2020-05-31 ENCOUNTER — Ambulatory Visit
Admission: RE | Admit: 2020-05-31 | Discharge: 2020-05-31 | Disposition: A | Discharge status: Home / Self Care | Payer: Managed Care, Other (non HMO) | Source: Ambulatory Visit | Attending: Adult Health Nurse Practitioner | Admitting: Adult Health Nurse Practitioner

## 2020-05-31 DIAGNOSIS — R1031 Right lower quadrant pain: Secondary | ICD-10-CM

## 2020-06-01 ENCOUNTER — Other Ambulatory Visit: Payer: Self-pay | Admitting: Adult Health Nurse Practitioner

## 2020-06-01 DIAGNOSIS — N838 Other noninflammatory disorders of ovary, fallopian tube and broad ligament: Secondary | ICD-10-CM

## 2020-07-29 ENCOUNTER — Other Ambulatory Visit: Payer: Self-pay | Admitting: Internal Medicine

## 2020-08-17 DIAGNOSIS — I700 Atherosclerosis of aorta: Secondary | ICD-10-CM | POA: Insufficient documentation

## 2020-08-17 NOTE — Progress Notes (Deleted)
Complete Physical  Assessment and Plan:   Encounter for general adult medical examination with abnormal findings Mammogram ***  Atherosclerosis of aorta Control blood pressure, cholesterol, glucose, increase exercise.   Essential hypertension Continue medications *** - continue medications, DASH diet, exercise and monitor at home. Call if greater than 130/80.  -     CBC with Differential/Platelet -     CMP/GFR -     TSH -     Urinalysis, Routine w reflex microscopic -     Microalbumin / creatinine urine ratio -     EKG 12-Lead  Mixed hyperlipidemia -continue medications, check lipids, decrease fatty foods, increase activity.  -     Lipid panel  Medication management -     Magnesium  Recurrent major depressive disorder, in partial remission (HCC) - continue medications, stress management techniques discussed, increase water, good sleep hygiene discussed, increase exercise, and increase veggies.   Vitamin D deficiency Continue supplement  Anxiety stress management techniques discussed, increase water, good sleep hygiene discussed, increase exercise, and increase veggies.   ESOPHAGEAL STRICTURE Continue PPI/H2 blocker, diet discussed  Gastroesophageal reflux disease, esophagitis presence not specified Continue PPI/H2 blocker, diet discussed - Check magnesium, B12   Prediabetes Check A1C Emphasized weight loss, diet/exercise  Low back pain *** Negative straight leg raise; Replace mattress; back exercises provided; mobic 7.5 -15 mg once daily PRN at work Discussed PT referral; she declines this today   Pelvic lesion/abnormal CT ***   Future Appointments  Date Time Provider Department Center  08/19/2020  9:00 AM Judd Gaudierorbett, Jesson Foskey, NP [facility] None     HPI  64 y.o. female  presents for a complete physical. She has Major depression in partial remission (HCC); ESOPHAGEAL STRICTURE; Esophageal reflux; Vitamin D deficiency; Hypertension; Hyperlipemia; Medication  management; Overweight (BMI 25.0-29.9); Other abnormal glucose (prediabetes); Urge incontinence; Hepatic steatosis; and History of COVID-19 on their problem list.   She is divorced, works at [employer], no children.   She does not have any new concerns.  She is on lexapro for recurrent depression and feels this is helpful.   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 She has been wearing brace at work which has been helpful. She is taking meloxicam 15 mg some days when working. ***  *** why on potassium *** should we stop HCTZ and try spironol  Hx of GERD/stricture/dysphagia ***  She had abd/pelvis CT in 05/2020 for abdominal pain; incidentally showed aortic atherosclerosis and 1.6 cm intermediate attenuation lesions in region of L adnexa recommended for pelvic ultrasound which hasn't been completed ***  BMI is There is no height or weight on file to calculate BMI., she has not been working on diet and exercise. 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  She drinks mainly water, drinks 1-2 bottles of 20 oz 1 cup of coffee daily  She had US 12/2019 which showed diffuse hepatosteatosis *** Wt Readings from Last 3 Encounters:  05/04/20 152 lb 3.2 oz (69 kg)  02/23/20 162 lb 9.6 oz (73.8 kg)  11/20/19 169 lb (76.7 kg)   She has not been checking BPs at home, today their BP is  .   She does not workout. She denies chest pain, shortness of breath, dizziness.   She is on cholesterol medication (pravastatin 40 mg daily) and denies myalgias. Her  cholesterol is at goal. The cholesterol last visit was:  Lab Results  Component Value Date   CHOL 139 02/23/2020   HDL 43 (L) 02/23/2020   LDLCALC 69 02/23/2020   TRIG 196 (H) 02/23/2020   CHOLHDL 3.2 02/23/2020  . She has not been working on diet and exercise for prediabetes, and denies foot  ulcerations, hyperglycemia, hypoglycemia , increased appetite, nausea, paresthesia of the feet, polydipsia, polyuria, visual disturbances, vomiting and weight loss. Last A1C in the office was:  Lab Results  Component Value Date   HGBA1C 6.0 (H) 02/23/2020   She has CKD IIIa associated with htn; on ARB; last GFR:  Lab Results  Component Value Date   GFRAA 54 (L) 05/04/2020   Patient is on Vitamin D supplement, taking 86761 IU daily   Lab Results  Component Value Date   VD25OH 73 08/20/2019      Current Medications:  Current Outpatient Medications on File Prior to Visit  Medication Sig Dispense Refill  . albuterol (VENTOLIN HFA) 108 (90 Base) MCG/ACT inhaler Inhale 2 puffs into the lungs every 4 (four) hours as needed for wheezing or shortness of breath. 1 Inhaler 0  . amLODipine (NORVASC) 10 MG tablet Take 1 tablet Daily for BP 90 tablet 0  . aspirin 81 MG tablet Take 81 mg by mouth daily.    Marland Kitchen azelastine (ASTELIN) 0.1 % nasal spray PLACE 2 SPRAYS INTO BOTH NOSTRILS 2 (TWO) TIMES DAILY. USE IN EACH NOSTRIL AS DIRECTED 30 mL 1  . Biotin 300 MCG TABS Take 1 tablet by mouth at bedtime.     . CHOLECALCIFEROL PO Take 10,000 Units by mouth daily.    Marland Kitchen dicyclomine (BENTYL) 10 MG capsule Take 1 capsule (10 mg total) by mouth 4 (four) times daily for 14 days. 56 capsule 0  . escitalopram (LEXAPRO) 20 MG tablet Take 1 tablet Daily for Mood 90 tablet 0  . famotidine (PEPCID) 40 MG tablet Take 1 tablet at bedtime for Acid Reflux (Patient not taking: Reported on 05/04/2020) 90 tablet 3  . fluticasone (FLONASE) 50 MCG/ACT nasal spray SPRAY TWO SPRAYS IN EACH NOSTRIL ONCE DAILY 16 g 2  . gabapentin (NEURONTIN) 300 MG capsule Take 1 capsule 3 x /day for Pain 90 capsule 2  . latanoprost (XALATAN) 0.005 % ophthalmic solution Place 1 drop into both eyes at bedtime.    Marland Kitchen losartan-hydrochlorothiazide (HYZAAR) 50-12.5 MG tablet Take 1 tablet by mouth daily. 90 tablet 1  . Magnesium 250 MG TABS Take 1 tablet  by mouth 2 (two) times daily.     . meloxicam (MOBIC) 15 MG tablet Take 1/2- 1 tab daily with food as needed for back pain; can take with tylenol, can not take with aleve, iburpofen. (Patient not taking: Reported on 05/04/2020) 90 tablet 0  . omeprazole (PRILOSEC) 20 MG capsule TAKE ONE CAPSULE BY MOUTH ONE HOUR PRIOR TO BEDTIME FOR 2 WEEKS, THEN TAKE AS NEEDED (Patient not taking: Reported on 05/04/2020) 90 capsule 1  . potassium chloride (KLOR-CON) 10 MEQ tablet TAKE TWO TABLETS BY MOUTH DAILY FOR POTASSIUM 60 tablet 2  . promethazine-dextromethorphan (PROMETHAZINE-DM) 6.25-15 MG/5ML syrup TAKE 5 MLS BY MOUTH FOUR TIMES A DAY AS NEEDED FOR COUGH 240 mL 0  . rosuvastatin (CRESTOR) 40 MG tablet Take 1 tablet Daily for Cholesterol 90 tablet 1   No current facility-administered medications on file prior to visit.    Health Maintenance:   Immunization History  Administered Date(s) Administered  . Influenza Split 08/30/2015  .  Influenza,inj,Quad PF,6+ Mos 08/09/2019  . Influenza-Unspecified 10/09/2017  . Pneumococcal-Unspecified 12/11/2008  . Td 12/11/2005  . Tdap 12/30/2015  . Zoster Recombinat (Shingrix) 04/26/2017, 07/25/2017   Tetanus: 2017 Pneumovax: 2010, due age 78 Flu vaccine: 2020 Shingrix: 2018 Covid 19: ***  LMP: postmenopausal Pap: 2018 neg, HPV neg, due 2021-2023 *** MGM:11/2019, *** historically poor follow through DEXA: get at age 63  Colonoscopy: 02/2010, Dr Arlyce Dice ***DUE EGD: 2015  Last Dental Exam: 2020, goes q93m Last Eye Exam: 2020, goes annually, glasses  Patient Care Team: Lucky Cowboy, MD as PCP - General (Internal Medicine) Louis Meckel, MD (Inactive) as Consulting Physician (Gastroenterology) Shea Evans, MD as Consulting Physician (Obstetrics and Gynecology) Nadara Mustard, MD as Consulting Physician (Orthopedic Surgery)  Medical History:  Past Medical History:  Diagnosis Date  . Allergy   . Anxiety   . Depression   . Esophageal stricture    . GERD (gastroesophageal reflux disease)   . Hyperlipemia   . Hypertension   . Unspecified vitamin D deficiency    Allergies Allergies  Allergen Reactions  . 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 Clinoril [Sulindac] Rash    SURGICAL HISTORY She  has a past surgical history that includes Cholecystectomy; Esophageal dilation (2009); and LEEP (2004). FAMILY HISTORY Her family history includes Breast cancer (age of onset: 23) in her sister; Diabetes in her sister; Glaucoma in her mother and sister; Hyperlipidemia in her mother; Hypertension in her brother, mother, and sister; Stroke (age of onset: 37) in her mother. SOCIAL HISTORY She  reports that she quit smoking about 18 years ago. Her smoking use included cigarettes. She has a 1.50 pack-year smoking history. She has never used smokeless tobacco. She reports that she does not drink alcohol and does not use drugs.  Review of Systems: *** Review of Systems  Constitutional: Negative for chills, diaphoresis, fever and malaise/fatigue.  HENT: Negative for congestion, ear pain and sore throat.   Eyes: Negative.   Respiratory: Negative for cough, shortness of breath and wheezing.   Cardiovascular: Negative for chest pain, palpitations and leg swelling.  Gastrointestinal: Negative for abdominal pain, blood in stool, constipation, diarrhea, heartburn and melena.  Genitourinary: Negative.        Urge incontinence, wears 1-2 pads per day  Musculoskeletal: Positive for back pain. Negative for falls, joint pain, myalgias and neck pain.  Skin: Negative.   Neurological: Negative for dizziness, sensory change, loss of consciousness and headaches.  Psychiatric/Behavioral: Negative for depression. The patient is not nervous/anxious and does not have insomnia.     Physical Exam: Estimated body mass index is 27.39 kg/m as calculated from the following:   Height as of 08/20/19: 5' 2.5" (1.588 m).   Weight  as of 05/04/20: 152 lb 3.2 oz (69 kg). There were no vitals taken for this visit.  General Appearance: Well nourished well developed, in no apparent distress.  Eyes: PERRLA, EOMs, conjunctiva no swelling or erythema ENT/Mouth: Ear canals normal without obstruction, swelling, erythema, or discharge.  TMs normal bilaterally with no erythema, bulging, retraction, or loss of landmark.  Oropharynx moist and clear with no exudate, erythema, or swelling.   Neck: Supple, thyroid normal. No bruits.  No cervical adenopathy Respiratory: Respiratory effort normal, Breath sounds clear A&P without wheeze, rhonchi, rales.   Cardio: RRR without murmurs, rubs or gallops. Brisk peripheral pulses without edema.  Chest: symmetric, with normal excursions Breasts: Symmetric, without lumps, nipple discharge,  retractions. *** Abdomen: Soft, nontender, no guarding, rebound, hernias, masses, or organomegaly.  Lymphatics: Non tender without lymphadenopathy.  Genitourinary: defer *** Musculoskeletal: Full ROM all peripheral extremities,5/5 strength, and normal gait.  Skin: Warm, dry without rashes, lesions, ecchymosis. Neuro: Awake and oriented X 3, Cranial nerves intact, reflexes equal bilaterally. Normal muscle tone, no cerebellar symptoms. Sensation intact.  Psych:  normal affect, Insight and Judgment appropriate.   EKG: NSR, inverted T waves in leads III and aVR, no notable changes from previous ***  Over 40 minutes of exam, counseling, chart review and critical decision making was performed  Dan Maker 1:42 PM [facility]

## 2020-08-19 ENCOUNTER — Encounter: Payer: Managed Care, Other (non HMO) | Admitting: Adult Health

## 2020-08-19 ENCOUNTER — Other Ambulatory Visit: Payer: Self-pay | Admitting: Adult Health

## 2020-08-19 ENCOUNTER — Other Ambulatory Visit: Payer: Self-pay | Admitting: Internal Medicine

## 2020-08-19 DIAGNOSIS — I10 Essential (primary) hypertension: Secondary | ICD-10-CM

## 2020-08-29 ENCOUNTER — Other Ambulatory Visit: Payer: Self-pay | Admitting: Internal Medicine

## 2020-08-29 DIAGNOSIS — M25562 Pain in left knee: Secondary | ICD-10-CM

## 2020-08-29 DIAGNOSIS — E782 Mixed hyperlipidemia: Secondary | ICD-10-CM

## 2020-09-01 ENCOUNTER — Encounter: Payer: Self-pay | Admitting: Emergency Medicine

## 2020-09-01 ENCOUNTER — Emergency Department
Admission: EM | Admit: 2020-09-01 | Discharge: 2020-09-01 | Disposition: A | Discharge status: Home / Self Care | Payer: No Typology Code available for payment source | Attending: Emergency Medicine | Admitting: Emergency Medicine

## 2020-09-01 ENCOUNTER — Other Ambulatory Visit: Payer: Self-pay

## 2020-09-01 ENCOUNTER — Emergency Department: Payer: No Typology Code available for payment source

## 2020-09-01 DIAGNOSIS — Z87891 Personal history of nicotine dependence: Secondary | ICD-10-CM | POA: Diagnosis not present

## 2020-09-01 DIAGNOSIS — W01198A Fall on same level from slipping, tripping and stumbling with subsequent striking against other object, initial encounter: Secondary | ICD-10-CM | POA: Insufficient documentation

## 2020-09-01 DIAGNOSIS — S0003XA Contusion of scalp, initial encounter: Secondary | ICD-10-CM | POA: Insufficient documentation

## 2020-09-01 DIAGNOSIS — Z7982 Long term (current) use of aspirin: Secondary | ICD-10-CM | POA: Diagnosis not present

## 2020-09-01 DIAGNOSIS — Y9389 Activity, other specified: Secondary | ICD-10-CM | POA: Insufficient documentation

## 2020-09-01 DIAGNOSIS — Z79899 Other long term (current) drug therapy: Secondary | ICD-10-CM | POA: Diagnosis not present

## 2020-09-01 DIAGNOSIS — S0990XA Unspecified injury of head, initial encounter: Secondary | ICD-10-CM | POA: Diagnosis present

## 2020-09-01 DIAGNOSIS — W19XXXA Unspecified fall, initial encounter: Secondary | ICD-10-CM

## 2020-09-01 DIAGNOSIS — Y9289 Other specified places as the place of occurrence of the external cause: Secondary | ICD-10-CM | POA: Diagnosis not present

## 2020-09-01 DIAGNOSIS — I10 Essential (primary) hypertension: Secondary | ICD-10-CM | POA: Diagnosis not present

## 2020-09-01 DIAGNOSIS — Y990 Civilian activity done for income or pay: Secondary | ICD-10-CM | POA: Diagnosis not present

## 2020-09-01 DIAGNOSIS — G44319 Acute post-traumatic headache, not intractable: Secondary | ICD-10-CM

## 2020-09-01 MED ORDER — CLONIDINE HCL 0.1 MG PO TABS
0.1000 mg | ORAL_TABLET | Freq: Once | ORAL | Status: AC
  Administered 2020-09-01: 0.1 mg via ORAL
  Filled 2020-09-01: qty 1

## 2020-09-01 MED ORDER — BUTALBITAL-APAP-CAFFEINE 50-325-40 MG PO TABS
1.0000 | ORAL_TABLET | Freq: Four times a day (QID) | ORAL | 0 refills | Status: AC | PRN
Start: 2020-09-01 — End: 2021-09-01

## 2020-09-01 MED ORDER — BUTALBITAL-APAP-CAFFEINE 50-325-40 MG PO TABS
1.0000 | ORAL_TABLET | Freq: Once | ORAL | Status: AC
  Administered 2020-09-01: 1 via ORAL
  Filled 2020-09-01: qty 1

## 2020-09-01 NOTE — ED Notes (Signed)
Pt's repeat BP is 228/87. PA Christiane Ha notified. See [facility]

## 2020-09-01 NOTE — ED Provider Notes (Signed)
[facility] Emergency Department Provider Note  ____________________________________________  Time seen: Approximately 5:30 PM  I have reviewed the triage vital signs and the nursing notes.   HISTORY  Chief Complaint Fall    HPI Kaitlyn [name] is a 64 y.o. female who presents emergency department for evaluation of head injury.  Patient states that she was at work, slipped, fell backwards striking her head on the floor.  Patient states that she works at [employer], slipped on the wet floor.  She does have a hematoma to the left occipital skull region.  She initially had a presevere headache but states that the headache seems to be improving at this time.  No loss of consciousness at any time.  No neck pain, back pain, extremity pain.  No medicines prior to arrival.  Medical history as described below with no complaints with chronic medical problems.         Past Medical History:  Diagnosis Date  . Allergy   . Anxiety   . Depression   . Esophageal stricture   . GERD (gastroesophageal reflux disease)   . Hyperlipemia   . Hypertension   . Unspecified vitamin D deficiency     Patient Active Problem List   Diagnosis Date Noted  . Aortic atherosclerosis (HCC) 08/17/2020  . History of COVID-19 02/23/2020  . Hepatic steatosis 12/15/2019  . Urge incontinence 08/20/2019  . Other abnormal glucose (prediabetes) 10/22/2018  . Overweight (BMI 25.0-29.9) 07/10/2018  . Medication management 05/13/2015  . Vitamin D deficiency   . Hypertension   . Hyperlipemia   . Major depression in partial remission (HCC) 01/09/2008  . ESOPHAGEAL STRICTURE 01/09/2008  . Esophageal reflux 01/09/2008    Past Surgical History:  Procedure Laterality Date  . CHOLECYSTECTOMY    . ESOPHAGEAL DILATION  2009  . LEEP  2004    Prior to Admission medications   Medication Sig Start Date End Date Taking? Authorizing Provider  albuterol (VENTOLIN HFA) 108 (90 Base) MCG/ACT  inhaler Inhale 2 puffs into the lungs every 4 (four) hours as needed for wheezing or shortness of breath. 12/07/16   Quentin Mulling, PA-C  amLODipine (NORVASC) 10 MG tablet TAKE ONE TABLET BY MOUTH DAILY FOR BLOOD PRESSURE 08/19/20   Judd Gaudier, NP  aspirin 81 MG tablet Take 81 mg by mouth daily.    [provider]  azelastine (ASTELIN) 0.1 % nasal spray PLACE 2 SPRAYS INTO BOTH NOSTRILS 2 (TWO) TIMES DAILY. USE IN EACH NOSTRIL AS DIRECTED 05/28/19   Judd Gaudier, NP  Biotin 300 MCG TABS Take 1 tablet by mouth at bedtime.     [provider]  butalbital-acetaminophen-caffeine (FIORICET) [phone] MG tablet Take 1 tablet by mouth every 6 (six) hours as needed for headache. 09/01/20 09/01/21  Tashayla Therien, Delorise Royals, PA-C  CHOLECALCIFEROL PO Take 10,000 Units by mouth daily.    [provider]  dicyclomine (BENTYL) 10 MG capsule Take 1 capsule (10 mg total) by mouth 4 (four) times daily for 14 days. 05/04/20 05/18/20  Elder Negus, NP  escitalopram (LEXAPRO) 20 MG tablet TAKE ONE TABLET BY MOUTH DAILY FOR MOOD 08/19/20   Judd Gaudier, NP  famotidine (PEPCID) 40 MG tablet Take 1 tablet at bedtime for Acid Reflux Patient not taking: Reported on 05/04/2020 09/25/18   Lucky Cowboy, MD  fluticasone [facility]) 50 MCG/ACT nasal spray SPRAY TWO SPRAYS IN [facility] NOSTRIL ONCE DAILY 04/11/18   Quentin Mulling, PA-C  gabapentin (NEURONTIN) 300 MG capsule Take 1 capsule 3  x /day for Chronic Pain 08/29/20   Lucky Cowboy, MD  latanoprost (XALATAN) 0.005 % ophthalmic solution Place 1 drop into both eyes at bedtime.    [provider]  losartan-hydrochlorothiazide [facility]) 50-12.5 MG tablet TAKE ONE TABLET BY MOUTH DAILY 08/19/20   Lucky Cowboy, MD  Magnesium 250 MG TABS Take 1 tablet by mouth 2 (two) times daily.     [provider]  meloxicam (MOBIC) 15 MG tablet Take 1/2- 1 tab daily with food as needed for back pain; can take with tylenol, can not take with aleve,  iburpofen. Patient not taking: Reported on 05/04/2020 02/23/20   Judd Gaudier, NP  omeprazole (PRILOSEC) 20 MG capsule TAKE ONE CAPSULE BY MOUTH ONE HOUR PRIOR TO BEDTIME FOR 2 WEEKS, THEN TAKE AS NEEDED Patient not taking: Reported on 05/04/2020 03/18/20   Judd Gaudier, NP  potassium chloride (KLOR-CON) 10 MEQ tablet TAKE TWO TABLETS BY MOUTH DAILY FOR POTASSIUM 04/27/20   Judd Gaudier, NP  promethazine-dextromethorphan (PROMETHAZINE-DM) 6.25-15 MG/5ML syrup TAKE 5 MLS BY MOUTH FOUR TIMES A DAY AS NEEDED FOR COUGH 05/07/19   Judd Gaudier, NP  rosuvastatin (CRESTOR) 40 MG tablet TAKE 1 TABLET DAILY FOR CHOLESTEROL 08/29/20   Lucky Cowboy, MD    Allergies Ppd [tuberculin purified protein derivative] and Clinoril [sulindac]  Family History  Problem Relation Age of Onset  . Diabetes Sister   . Hypertension Sister   . Glaucoma Sister   . Hypertension Brother   . Breast cancer Sister 75       breast  . Hypertension Mother   . Stroke Mother 52  . Hyperlipidemia Mother   . Glaucoma Mother   . Colon cancer Neg Hx     Social History Social History   Tobacco Use  . Smoking status: Former Smoker    Packs/day: 0.50    Years: 3.00    Pack years: 1.50    Types: Cigarettes    Quit date: 03/08/2002    Years since quitting: 18.4  . Smokeless tobacco: Never Used  Vaping Use  . Vaping Use: Never used  Substance Use Topics  . Alcohol use: No  . Drug use: No     Review of Systems  Constitutional: No fever/chills Eyes: No visual changes. No discharge ENT: No upper respiratory complaints. Cardiovascular: no chest pain. Respiratory: no cough. No SOB. Gastrointestinal: No abdominal pain.  No nausea, no vomiting.  No diarrhea.  No constipation. Musculoskeletal: Negative for musculoskeletal pain. Skin: Negative for rash, abrasions, lacerations, ecchymosis. Neurological: Positive for posttraumatic headache.  Denies focal weakness or numbness. 10-point ROS otherwise  negative.  ____________________________________________   PHYSICAL EXAM:  VITAL SIGNS: ED Triage Vitals  Enc Vitals Group     BP 09/01/20 1413 (!) 168/87     Pulse Rate 09/01/20 1413 65     Resp 09/01/20 1413 16     Temp 09/01/20 1413 98.6 F (37 C)     Temp Source 09/01/20 1413 Oral     SpO2 09/01/20 1413 100 %     Weight 09/01/20 1409 159 lb (72.1 kg)     Height 09/01/20 1409  (1.6 m)     Head Circumference --      Peak Flow --      Pain Score 09/01/20 1409 10     Pain Loc --      Pain Edu? --      Excl. in GC? --      Constitutional: Alert and oriented. Well appearing and  in no acute distress. Eyes: Conjunctivae are normal. PERRL. EOMI. Head: Hematoma noted to the left occipital skull.  No abrasions or lacerations.  Patient is very tender to palpation over the hematoma but no palpable underlying abnormality.  No crepitus.  No battle signs, raccoon eyes, serosanguineous fluid drainage from the ears or nares. ENT:      Ears:       Nose: No congestion/rhinnorhea.      Mouth/Throat: Mucous membranes are moist.  Neck: No stridor.  No cervical spine tenderness to palpation.  Cardiovascular: Normal rate, regular rhythm. Normal S1 and S2.  Good peripheral circulation. Respiratory: Normal respiratory effort without tachypnea or retractions. Lungs CTAB. Good air entry to the bases with no decreased or absent breath sounds. Musculoskeletal: Full range of motion to all extremities. No gross deformities appreciated. Neurologic:  Normal speech and language. No gross focal neurologic deficits are appreciated.  Cranial nerves II through XII grossly intact.  Negative Romberg's and pronator drift. Skin:  Skin is warm, dry and intact. No rash noted. Psychiatric: Mood and affect are normal. Speech and behavior are normal. Patient exhibits appropriate insight and judgement.   ____________________________________________   LABS (all labs ordered are listed, but only abnormal results  are displayed)  Labs Reviewed - No data to display ____________________________________________  EKG   ____________________________________________  RADIOLOGY I personally viewed and evaluated these images as part of my medical decision making, as well as reviewing the written report by the radiologist.  CT Head Wo Contrast  Result Date: 09/01/2020 CLINICAL DATA:  Poly trauma, critical, head/cervical spine injury suspected; fall, hit head on floor, hematoma left occipital skull. EXAM: CT HEAD WITHOUT CONTRAST CT CERVICAL SPINE WITHOUT CONTRAST TECHNIQUE: Multidetector CT imaging of the head and cervical spine was performed following the standard protocol without intravenous contrast. Multiplanar CT image reconstructions of the cervical spine were also generated. COMPARISON:  No pertinent prior exams are available for comparison. FINDINGS: CT HEAD FINDINGS Brain: Cerebral volume is normal. There is no acute intracranial hemorrhage. No demarcated cortical infarct. No extra-axial fluid collection. No evidence of intracranial mass. No midline shift. Vascular: No hyperdense vessel.  Atherosclerotic calcifications. Skull: Normal. Negative for fracture or focal lesion. Sinuses/Orbits: Visualized orbits show no acute finding. No significant paranasal sinus disease at the imaged levels. Right mastoid effusion. Other: Left parietooccipital scalp hematoma. CT CERVICAL SPINE FINDINGS Alignment: A mild cervical levocurvature may be positional. Reversal of the expected cervical lordosis. Trace C2-C3 grade 1 retrolisthesis. 2 mm C5-C6 grade 1 retrolisthesis. Trace C6-C7 grade 1 retrolisthesis. Skull base and vertebrae: The basion-dental and atlanto-dental intervals are maintained.No evidence of acute fracture to the cervical spine. Soft tissues and spinal canal: No prevertebral fluid or swelling. No visible canal hematoma. Disc levels: Cervical spondylosis with multilevel disc space narrowing and uncovertebral  hypertrophy. Central disc protrusion at C3-C4. Disc bulges at C4-C5, C5-C6 and C6-C7. Suspected mild/moderate spinal canal stenosis at C3-C4, C4-C5 and C5-C6. Multilevel bony neural foraminal narrowing greatest on the left at C4-C5, C5-C6, C6-C7 and C7-T1 and on the right at C6-C7 and C7-T1. Upper chest: No consolidation within the imaged lung apices. No visible pneumothorax. IMPRESSION: CT head: 1. No evidence of acute intracranial abnormality. 2. Left parietooccipital scalp hematoma. 3. Small right mastoid effusion. CT cervical spine: 1. No evidence of acute fracture to the cervical spine. 2. Nonspecific reversal of the expected cervical lordosis. 3. Mild multilevel grade 1 spondylolisthesis as detailed. 4. Cervical spondylosis as outlined. Multifactorial mild/moderate spinal canal stenosis at C3-C4,  C4-C5 and C5-C6. Multilevel bony neural foraminal narrowing. Electronically Signed   By: Jackey Loge DO   On: 09/01/2020 18:49   CT Cervical Spine Wo Contrast  Result Date: 09/01/2020 CLINICAL DATA:  Poly trauma, critical, head/cervical spine injury suspected; fall, hit head on floor, hematoma left occipital skull. EXAM: CT HEAD WITHOUT CONTRAST CT CERVICAL SPINE WITHOUT CONTRAST TECHNIQUE: Multidetector CT imaging of the head and cervical spine was performed following the standard protocol without intravenous contrast. Multiplanar CT image reconstructions of the cervical spine were also generated. COMPARISON:  No pertinent prior exams are available for comparison. FINDINGS: CT HEAD FINDINGS Brain: Cerebral volume is normal. There is no acute intracranial hemorrhage. No demarcated cortical infarct. No extra-axial fluid collection. No evidence of intracranial mass. No midline shift. Vascular: No hyperdense vessel.  Atherosclerotic calcifications. Skull: Normal. Negative for fracture or focal lesion. Sinuses/Orbits: Visualized orbits show no acute finding. No significant paranasal sinus disease at the imaged  levels. Right mastoid effusion. Other: Left parietooccipital scalp hematoma. CT CERVICAL SPINE FINDINGS Alignment: A mild cervical levocurvature may be positional. Reversal of the expected cervical lordosis. Trace C2-C3 grade 1 retrolisthesis. 2 mm C5-C6 grade 1 retrolisthesis. Trace C6-C7 grade 1 retrolisthesis. Skull base and vertebrae: The basion-dental and atlanto-dental intervals are maintained.No evidence of acute fracture to the cervical spine. Soft tissues and spinal canal: No prevertebral fluid or swelling. No visible canal hematoma. Disc levels: Cervical spondylosis with multilevel disc space narrowing and uncovertebral hypertrophy. Central disc protrusion at C3-C4. Disc bulges at C4-C5, C5-C6 and C6-C7. Suspected mild/moderate spinal canal stenosis at C3-C4, C4-C5 and C5-C6. Multilevel bony neural foraminal narrowing greatest on the left at C4-C5, C5-C6, C6-C7 and C7-T1 and on the right at C6-C7 and C7-T1. Upper chest: No consolidation within the imaged lung apices. No visible pneumothorax. IMPRESSION: CT head: 1. No evidence of acute intracranial abnormality. 2. Left parietooccipital scalp hematoma. 3. Small right mastoid effusion. CT cervical spine: 1. No evidence of acute fracture to the cervical spine. 2. Nonspecific reversal of the expected cervical lordosis. 3. Mild multilevel grade 1 spondylolisthesis as detailed. 4. Cervical spondylosis as outlined. Multifactorial mild/moderate spinal canal stenosis at C3-C4, C4-C5 and C5-C6. Multilevel bony neural foraminal narrowing. Electronically Signed   By: Jackey Loge DO   On: 09/01/2020 18:49    ____________________________________________    PROCEDURES  Procedure(s) performed:    Procedures    Medications  butalbital-acetaminophen-caffeine (FIORICET) 50-325-40 MG per tablet 1 tablet (has no administration in time range)     ____________________________________________   INITIAL IMPRESSION / ASSESSMENT AND PLAN / ED  COURSE  Pertinent labs & imaging results that were available during my care of the patient were reviewed by me and considered in my medical decision making (see chart for details).  Review of the [place] CSRS was performed in accordance of the [facility] prior to dispensing any controlled drugs.           Patient's diagnosis is consistent with fall, minor head injury, posttraumatic headache.  Patient presented to the emergency department after slipping on a wet floor, falling striking her head.  Patient did not lose consciousness but had an immediate hematoma formed to the occipital skull.  Patient reported a headache at this time.  No visual changes.  Patient's neuro exam is reassuring with no deficits.  Given the mechanism of injury patient had imaging which revealed no acute traumatic findings.  Patient remains neurologically intact.  Patient will be given symptom control medications at home.  Follow-up primary  care as needed.. Patient is given ED precautions to return to the ED for any worsening or new symptoms.     ____________________________________________  FINAL CLINICAL IMPRESSION(S) / ED DIAGNOSES  Final diagnoses:  Fall, initial encounter  Minor head injury, initial encounter  Acute post-traumatic headache, not intractable      NEW MEDICATIONS STARTED DURING THIS VISIT:  ED Discharge Orders         Ordered    butalbital-acetaminophen-caffeine (FIORICET) 50-325-40 MG tablet  Every 6 hours PRN        09/01/20 1920              This chart was dictated using voice recognition software/Dragon. Despite best efforts to proofread, errors can occur which can change the meaning. Any change was purely unintentional.    Lanette Hampshire 09/01/20 Penni Homans, MD 09/01/20 2326

## 2020-09-01 NOTE — ED Notes (Signed)
Pt unable to sign E-signature due to signature pad malfunction. Pt verbalized understanding of d/c instructions and had no additional questions or concerns for this RN or provider. Pt left with d/c instructions and gathered all personal belongings from room and removed them prior to ED departure.   

## 2020-09-01 NOTE — ED Notes (Signed)
Per WC profile no urine drug screen required per employer profile. Pt informed of said information

## 2020-09-01 NOTE — ED Triage Notes (Signed)
Pt in via [facility] from [employer] where she works.  Pt slipped on a wet floor, falling backwards hitting head on tile floor.  Pt denies LOC at time of incident, denies blood thinners, denies any neurological changes.  Pt A/Ox4, NAD noted at this time.

## 2020-09-09 ENCOUNTER — Other Ambulatory Visit: Payer: Self-pay

## 2020-09-09 ENCOUNTER — Ambulatory Visit (INDEPENDENT_AMBULATORY_CARE_PROVIDER_SITE_OTHER): Payer: Managed Care, Other (non HMO) | Admitting: Adult Health

## 2020-09-09 ENCOUNTER — Encounter: Payer: Self-pay | Admitting: Adult Health

## 2020-09-09 VITALS — BP 146/90 | HR 60 | Temp 97.3°F | Ht 63.0 in | Wt 163.0 lb

## 2020-09-09 DIAGNOSIS — Z131 Encounter for screening for diabetes mellitus: Secondary | ICD-10-CM | POA: Diagnosis not present

## 2020-09-09 DIAGNOSIS — Z1329 Encounter for screening for other suspected endocrine disorder: Secondary | ICD-10-CM

## 2020-09-09 DIAGNOSIS — E663 Overweight: Secondary | ICD-10-CM

## 2020-09-09 DIAGNOSIS — I10 Essential (primary) hypertension: Secondary | ICD-10-CM | POA: Diagnosis not present

## 2020-09-09 DIAGNOSIS — Z0000 Encounter for general adult medical examination without abnormal findings: Secondary | ICD-10-CM

## 2020-09-09 DIAGNOSIS — E782 Mixed hyperlipidemia: Secondary | ICD-10-CM

## 2020-09-09 DIAGNOSIS — Z1389 Encounter for screening for other disorder: Secondary | ICD-10-CM

## 2020-09-09 DIAGNOSIS — Z136 Encounter for screening for cardiovascular disorders: Secondary | ICD-10-CM

## 2020-09-09 DIAGNOSIS — F3341 Major depressive disorder, recurrent, in partial remission: Secondary | ICD-10-CM

## 2020-09-09 DIAGNOSIS — Z13220 Encounter for screening for lipoid disorders: Secondary | ICD-10-CM

## 2020-09-09 DIAGNOSIS — R7309 Other abnormal glucose: Secondary | ICD-10-CM

## 2020-09-09 DIAGNOSIS — Z1211 Encounter for screening for malignant neoplasm of colon: Secondary | ICD-10-CM

## 2020-09-09 DIAGNOSIS — Z79899 Other long term (current) drug therapy: Secondary | ICD-10-CM

## 2020-09-09 DIAGNOSIS — K760 Fatty (change of) liver, not elsewhere classified: Secondary | ICD-10-CM

## 2020-09-09 DIAGNOSIS — E559 Vitamin D deficiency, unspecified: Secondary | ICD-10-CM

## 2020-09-09 DIAGNOSIS — Z23 Encounter for immunization: Secondary | ICD-10-CM

## 2020-09-09 DIAGNOSIS — M799 Soft tissue disorder, unspecified: Secondary | ICD-10-CM | POA: Insufficient documentation

## 2020-09-09 DIAGNOSIS — R935 Abnormal findings on diagnostic imaging of other abdominal regions, including retroperitoneum: Secondary | ICD-10-CM

## 2020-09-09 DIAGNOSIS — N3941 Urge incontinence: Secondary | ICD-10-CM

## 2020-09-09 DIAGNOSIS — K219 Gastro-esophageal reflux disease without esophagitis: Secondary | ICD-10-CM

## 2020-09-09 DIAGNOSIS — I700 Atherosclerosis of aorta: Secondary | ICD-10-CM

## 2020-09-09 MED ORDER — LOSARTAN POTASSIUM-HCTZ 100-25 MG PO TABS: 1.0000 | ORAL_TABLET | Freq: Every day | ORAL | 1 refills | Status: DC

## 2020-09-09 NOTE — Progress Notes (Signed)
Complete Physical  Assessment and Plan:   Encounter for general adult medical examination with abnormal findings Mammogram due 11/2020, phone number given to schedule Flu vaccine given today without complication   Atherosclerosis of aorta Control blood pressure, cholesterol, glucose, increase exercise.   Essential hypertension Has been off of amlodipine - try hyzaar 100/25 mg to reduce pill burden, continue toprol Recheck 4 weeks with BMP; if persists above goal restart amlodipine - continue medications, DASH diet, exercise and monitor at home. Call if greater than 130/80.  -     CBC with Differential/Platelet -     CMP/GFR -     TSH -     Urinalysis, Routine w reflex microscopic -     Microalbumin / creatinine urine ratio -     EKG 12-Lead  Mixed hyperlipidemia -continue medications, check lipids, decrease fatty foods, increase activity.  -     Lipid panel  Medication management -     Magnesium  Recurrent major depressive disorder, in partial remission (HCC) - continue medications, stress management techniques discussed, increase water, good sleep hygiene discussed, increase exercise, and increase veggies.   Vitamin D deficiency Continue supplement  Anxiety stress management techniques discussed, increase water, good sleep hygiene discussed, increase exercise, and increase veggies.   ESOPHAGEAL STRICTURE Continue PPI, diet discussed  Gastroesophageal reflux disease, esophagitis presence not specified Continue PPIr, diet discussed  Hepatic steatosis Weight loss advised, avoid alcohol/tylenol, will monitor LFTs  Prediabetes Check A1C Emphasized weight loss, diet/exercise  Pelvic lesion/abnormal CT Discussed [place], should be low cost, urged to schedule ASAP, given phone number to schedule  Orders Placed This Encounter  Procedures  . FLU VACCINE MDCK QUAD W/Preservative  . CBC with Differential/Platelet  . COMPLETE METABOLIC PANEL WITH GFR  . Magnesium  . Lipid  panel  . TSH  . Hemoglobin A1c  . VITAMIN D 25 Hydroxy (Vit-D Deficiency, Fractures)  . Microalbumin / creatinine urine ratio  . Urinalysis, Routine w reflex microscopic  . Ambulatory referral to Gastroenterology  . EKG 12-Lead    Future Appointments  Date Time Provider Department Center  10/07/2020  4:00 PM [facility] NURSE [facility] None  01/10/2021  4:00 PM Judd Gaudier, NP [facility] None  09/12/2021  2:00 PM Judd Gaudier, NP [facility] None     HPI  64 y.o. female  presents for a complete physical. She has Major depression in partial remission (HCC); ESOPHAGEAL STRICTURE; Esophageal reflux; Vitamin D deficiency; Hypertension; Hyperlipemia; Medication management; Overweight (BMI 25.0-29.9); Other abnormal glucose (prediabetes); Urge incontinence; Hepatic steatosis; Aortic atherosclerosis (HCC); Soft tissue lesion of pelvic region; and Abnormal CT scan, pelvis on their problem list.   She is divorced, works at [employer], no children.   She does not have any new concerns.   She was evaluated at ED 09/01/2020 after slipped, fell at work and hit head; CT head showed no concerning acute findings. CT cervical spine showed spondylosis, mild/mod stenosis at C3-C6 . Multilevel bony neural foraminal narrowing. She reports achy pain, taking tizanidine.   She is on lexapro for recurrent depression and feels this is helpful.   Hx of GERD/stricture/dysphagia, taking omeprazole daily without breakthrough.   She had abd/pelvis CT in 05/2020 for abdominal pain; incidentally showed aortic atherosclerosis and 1.6 cm intermediate attenuation lesions in region of L adnexa recommended for pelvic ultrasound which hasn't been completed, states trying to pay off previous bill.   BMI is Body mass index is 28.87 kg/m., she has not been working on diet and exercise.  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  She drinks mainly water, drinks 2 bottles of 20 oz 1 cup of coffee daily   She had [place] 12/2019 which showed diffuse hepatosteatosis   Wt Readings from Last 3 Encounters:  09/09/20 163 lb (73.9 kg)  09/01/20 159 lb (72.1 kg)  05/04/20 152 lb 3.2 oz (69 kg)   She has not been checking BPs at home, today their BP is BP: (!) 146/90. Taking hyzaar 50/12.5, toprol 25 mg, hasn't been taking amlodipine  She does not workout. She denies chest pain, shortness of breath, dizziness.   She is on cholesterol medication (rosuvastatin 40 mg daily) and denies myalgias. Her cholesterol is at goal. The cholesterol last visit was:  Lab Results  Component Value Date   CHOL 139 02/23/2020   HDL 43 (L) 02/23/2020   LDLCALC 69 02/23/2020   TRIG 196 (H) 02/23/2020   CHOLHDL 3.2 02/23/2020  . She has not been working on diet and exercise for prediabetes, and denies foot ulcerations, hyperglycemia, hypoglycemia , increased appetite, nausea, paresthesia of the feet, polydipsia, polyuria, visual disturbances, vomiting and weight loss. Last A1C in the office was:  Lab Results  Component Value Date   HGBA1C 6.0 (H) 02/23/2020   She has CKD IIIa associated with htn; on ARB; last GFR:  Lab Results  Component Value Date   GFRAA 54 (L) 05/04/2020   Patient is on Vitamin D supplement, taking 30160 IU daily   Lab Results  Component Value Date   VD25OH 73 08/20/2019      Current Medications:  Current Outpatient Medications on File Prior to Visit  Medication Sig Dispense Refill  . albuterol (VENTOLIN HFA) 108 (90 Base) MCG/ACT inhaler Inhale 2 puffs into the lungs every 4 (four) hours as needed for wheezing or shortness of breath. 1 Inhaler 0  . aspirin 81 MG tablet Take 81 mg by mouth daily.    . Biotin 300 MCG TABS Take 1 tablet by mouth at bedtime.     . butalbital-acetaminophen-caffeine (FIORICET) 50-325-40 MG tablet Take 1 tablet by mouth every 6 (six) hours as needed for headache. 20 tablet 0  . CHOLECALCIFEROL PO Take 10,000 Units by mouth daily.    Marland Kitchen escitalopram (LEXAPRO) 20  MG tablet TAKE ONE TABLET BY MOUTH DAILY FOR MOOD 90 tablet 0  . gabapentin (NEURONTIN) 300 MG capsule Take 1 capsule 3 x /day for Chronic Pain 270 capsule 0  . latanoprost (XALATAN) 0.005 % ophthalmic solution Place 1 drop into both eyes at bedtime.    . Magnesium 250 MG TABS Take 1 tablet by mouth 2 (two) times daily.     . metoprolol succinate (TOPROL-XL) 25 MG 24 hr tablet Take 25 mg by mouth daily.    . potassium chloride (KLOR-CON) 10 MEQ tablet TAKE TWO TABLETS BY MOUTH DAILY FOR POTASSIUM 60 tablet 2  . rosuvastatin (CRESTOR) 40 MG tablet TAKE 1 TABLET DAILY FOR CHOLESTEROL 90 tablet 3  . tiZANidine (ZANAFLEX) 4 MG tablet Take 4 mg by mouth at bedtime.    Marland Kitchen azelastine (ASTELIN) 0.1 % nasal spray PLACE 2 SPRAYS INTO BOTH NOSTRILS 2 (TWO) TIMES DAILY. USE IN EACH NOSTRIL AS DIRECTED (Patient not taking: Reported on 09/09/2020) 30 mL 1  . fluticasone (FLONASE) 50 MCG/ACT nasal spray SPRAY TWO SPRAYS IN EACH NOSTRIL ONCE DAILY (Patient not taking: Reported on 09/09/2020) 16 g 2  . meloxicam (MOBIC) 15 MG tablet Take 1/2- 1 tab daily  with food as needed for back pain; can take with tylenol, can not take with aleve, iburpofen. (Patient not taking: Reported on 05/04/2020) 90 tablet 0  . omeprazole (PRILOSEC) 20 MG capsule TAKE ONE CAPSULE BY MOUTH ONE HOUR PRIOR TO BEDTIME FOR 2 WEEKS, THEN TAKE AS NEEDED (Patient not taking: Reported on 05/04/2020) 90 capsule 1   No current facility-administered medications on file prior to visit.    Health Maintenance:   Immunization History  Administered Date(s) Administered  . Influenza Inj Mdck Quad With Preservative 09/09/2020  . Influenza Split 08/30/2015  . Influenza,inj,Quad PF,6+ Mos 08/09/2019  . Influenza-Unspecified 10/09/2017  . PFIZER SARS-COV-2 Vaccination 03/12/2020, 04/02/2020  . Pneumococcal-Unspecified 12/11/2008  . Td 12/11/2005  . Tdap 12/30/2015  . Zoster Recombinat (Shingrix) 04/26/2017, 07/25/2017   Tetanus: 2017 Pneumovax: 2010,  due age 64 Flu vaccine: 2020, TODAY  Shingrix: 2018 Covid 19: 2/2, pfizer   LMP: postmenopausal Pap: 2018 neg, HPV neg, due 2021-2023 - defer today, insufficient time  MGM:11/2019 DEXA: get at age 64  Colonoscopy: 02/2010, Dr Arlyce DiceKaplan EGD: 2015  Last Dental Exam: 2021, goes q756m Last Eye Exam: 2020, goes annually, glasses  Patient Care Team: Lucky CowboyMcKeown, William, MD as PCP - General (Internal Medicine) Louis MeckelKaplan, Robert D, MD (Inactive) as Consulting Physician (Gastroenterology) Shea EvansMody, Vaishali, MD as Consulting Physician (Obstetrics and Gynecology) Nadara Mustarduda, Marcus V, MD as Consulting Physician (Orthopedic Surgery)  Medical History:  Past Medical History:  Diagnosis Date  . Allergy   . Anxiety   . Depression   . Esophageal stricture   . GERD (gastroesophageal reflux disease)   . History of COVID-19 02/23/2020  . Hyperlipemia   . Hypertension   . Unspecified vitamin D deficiency    Allergies Allergies  Allergen Reactions  . 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 Clinoril [Sulindac] Rash    SURGICAL HISTORY She  has a past surgical history that includes Cholecystectomy; Esophageal dilation (2009); and LEEP (2004). FAMILY HISTORY Her family history includes Breast cancer (age of onset: 4249) in her sister; Diabetes in her sister; Glaucoma in her mother and sister; Hyperlipidemia in her mother; Hypertension in her brother, mother, and sister; Stroke (age of onset: 5274) in her mother. SOCIAL HISTORY She  reports that she quit smoking about 18 years ago. Her smoking use included cigarettes. She has a 1.50 pack-year smoking history. She has never used smokeless tobacco. She reports that she does not drink alcohol and does not use drugs.  Review of Systems: Review of Systems  Constitutional: Negative for chills, diaphoresis, fever and malaise/fatigue.  HENT: Negative for congestion, ear pain and sore throat.   Eyes: Negative.   Respiratory: Negative  for cough, shortness of breath and wheezing.   Cardiovascular: Negative for chest pain, palpitations and leg swelling.  Gastrointestinal: Negative for abdominal pain, blood in stool, constipation, diarrhea, heartburn and melena.  Genitourinary: Negative.        Urge incontinence, wears 1-2 pads per day  Musculoskeletal: Positive for back pain, falls and neck pain (denies midline; trap/muscle pain since falling). Negative for joint pain and myalgias.  Skin: Negative.   Neurological: Negative for dizziness, sensory change, loss of consciousness and headaches.  Psychiatric/Behavioral: Negative for depression, substance abuse and suicidal ideas. The patient is not nervous/anxious and does not have insomnia.     Physical Exam: Estimated body mass index is 28.87 kg/m as calculated from the following:   Height as of this encounter: 5'  3" (1.6 m).   Weight as of this encounter: 163 lb (73.9 kg). BP (!) 146/90   Pulse 60   Temp (!) 97.3 F (36.3 C)   Ht 5\' 3"  (1.6 m)   Wt 163 lb (73.9 kg)   SpO2 93%   BMI 28.87 kg/m   General Appearance: Well nourished well developed, in no apparent distress.  Eyes: PERRLA, EOMs, conjunctiva no swelling or erythema ENT/Mouth: Ear canals normal without obstruction, swelling, erythema, or discharge.  TMs normal bilaterally with no erythema, bulging, retraction, or loss of landmark.  Oropharynx moist and clear with no exudate, erythema, or swelling.   Neck: Supple, thyroid normal. No bruits.  No cervical adenopathy Respiratory: Respiratory effort normal, Breath sounds clear A&P without wheeze, rhonchi, rales.   Cardio: RRR without murmurs, rubs or gallops. Brisk peripheral pulses without edema.  Chest: symmetric, with normal excursions Breasts: Symmetric, without lumps, nipple discharge, retractions.  Abdomen: Soft, nontender, no guarding, rebound, hernias, masses, or organomegaly.  Lymphatics: Non tender without lymphadenopathy.  Genitourinary: defer   Musculoskeletal: Full ROM all peripheral extremities,5/5 strength, and normal gait. She has neck paraspinal and trap muscle tenderness Skin: Warm, dry without rashes, lesions, ecchymosis. Neuro: Awake and oriented X 3, Cranial nerves intact, reflexes equal bilaterally. Normal muscle tone, no cerebellar symptoms. Sensation intact.  Psych:  normal affect, Insight and Judgment appropriate.   EKG: Sinus bradycardia  Over 40 minutes of exam, counseling, chart review and critical decision making was performed  5:30 PM [facility]

## 2020-09-09 NOTE — Patient Instructions (Addendum)
Kaitlyn [name] , Thank you for taking time to come for your Annual Wellness Visit. I appreciate your ongoing commitment to your health goals. Please review the following plan we discussed and let me know if I can assist you in the future.   These are the goals we discussed: Goals    . Blood Pressure < 130/80    . HEMOGLOBIN A1C < 5.7    . LDL CALC < 100    . Weight (lb) < 155 lb (70.3 kg)       This is a list of the screening recommended for you and due dates:  Health Maintenance  Topic Date Due  . COVID-19 Vaccine (1) Never done  . Colon Cancer Screening  02/09/2020  . Pap Smear  06/28/2020  . Flu Shot  07/11/2020  . Mammogram  11/20/2021  . Tetanus Vaccine  12/29/2025  .  Hepatitis C: One time screening is recommended by Center for Disease Control  (CDC) for  adults born from [birth year] through 1963.   Completed  . HIV Screening  Completed    Increase to hyzaar 100/25 mg daily  Check blood pressure at home for goal <130/80   HOW TO SCHEDULE A MAMMOGRAM - due December 2020  [facility]  7 a.m.-6:30 p.m., Monday 7 a.m.-5 p.m., Tuesday-Friday Schedule an appointment by calling [phone].  [facility] Schedule an appointment by calling [phone].     YOU CAN CALL TO MAKE AN ULTRASOUND..  I have put in an order for an ultrasound for you to have You can set them up at your convenience by calling this number [phone] You will likely have the ultrasound at [address] [facility] [address]  If you have any issues call our office and we will set this up for you.      Can try melatonin 5mg -15 mg at night for sleep, can also do benadryl 25-50mg  at night for sleep. If this does not help we can try prescription medication.  Also here is some information about good sleep hygiene.   Insomnia Insomnia is frequent trouble falling and/or staying asleep. Insomnia can be a long term problem or a short term problem. Both are common. Insomnia can  be a short term problem when the wakefulness is related to a certain stress or worry. Long term insomnia is often related to ongoing stress during waking hours and/or poor sleeping habits. Overtime, sleep deprivation itself can make the problem worse. Every little thing feels more severe because you are overtired and your ability to cope is decreased. CAUSES   Stress, anxiety, and depression.  Poor sleeping habits.  Distractions such as TV in the bedroom.  Naps close to bedtime.  Engaging in emotionally charged conversations before bed.  Technical reading before sleep.  Alcohol and other sedatives. They may make the problem worse. They can hurt normal sleep patterns and normal dream activity.  Stimulants such as caffeine for several hours prior to bedtime.  Pain syndromes and shortness of breath can cause insomnia.  Exercise late at night.  Changing time zones may cause sleeping problems (jet lag). It is sometimes helpful to have someone observe your sleeping patterns. They should look for periods of not breathing during the night (sleep apnea). They should also look to see how long those periods last. If you live alone or observers are uncertain, you can also be observed at a sleep clinic where your sleep patterns will be professionally monitored. Sleep  apnea requires a checkup and treatment. Give your caregivers your medical history. Give your caregivers observations your family has made about your sleep.  SYMPTOMS   Not feeling rested in the morning.  Anxiety and restlessness at bedtime.  Difficulty falling and staying asleep. TREATMENT   Your caregiver may prescribe treatment for an underlying medical disorders. Your caregiver can give advice or help if you are using alcohol or other drugs for self-medication. Treatment of underlying problems will usually eliminate insomnia problems.  Medications can be prescribed for short time use. They are generally not recommended for  lengthy use.  Over-the-counter sleep medicines are not recommended for lengthy use. They can be habit forming.  You can promote easier sleeping by making lifestyle changes such as:  Using relaxation techniques that help with breathing and reduce muscle tension.  Exercising earlier in the day.  Changing your diet and the time of your last meal. No night time snacks.  Establish a regular time to go to bed.  Counseling can help with stressful problems and worry.  Soothing music and white noise may be helpful if there are background noises you cannot remove.  Stop tedious detailed work at least one hour before bedtime. HOME CARE INSTRUCTIONS   Keep a diary. Inform your caregiver about your progress. This includes any medication side effects. See your caregiver regularly. Take note of:  Times when you are asleep.  Times when you are awake during the night.  The quality of your sleep.  How you feel the next day. This information will help your caregiver care for you.  Get out of bed if you are still awake after 15 minutes. Read or do some quiet activity. Keep the lights down. Wait until you feel sleepy and go back to bed.  Keep regular sleeping and waking hours. Avoid naps.  Exercise regularly.  Avoid distractions at bedtime. Distractions include watching television or engaging in any intense or detailed activity like attempting to balance the household checkbook.  Develop a bedtime ritual. Keep a familiar routine of bathing, brushing your teeth, climbing into bed at the same time each night, listening to soothing music. Routines increase the success of falling to sleep faster.  Use relaxation techniques. This can be using breathing and muscle tension release routines. It can also include visualizing peaceful scenes. You can also help control troubling or intruding thoughts by keeping your mind occupied with boring or repetitive thoughts like the old concept of counting sheep. You  can make it more creative like imagining planting one beautiful flower after another in your backyard garden.  During your day, work to eliminate stress. When this is not possible use some of the previous suggestions to help reduce the anxiety that accompanies stressful situations. MAKE SURE YOU:   Understand these instructions.  Will watch your condition.  Will get help right away if you are not doing well or get worse. Document Released: 11/24/2000 Document Revised: 02/19/2012 Document Reviewed: 12/25/2007 [facility] Patient Information 2015 [place], [place]. This information is not intended to replace advice given to you by your health care provider. Make sure you discuss any questions you have with your health care provider.      High-Fiber Diet Fiber, also called dietary fiber, is a type of carbohydrate that is found in fruits, vegetables, whole grains, and beans. A high-fiber diet can have many health benefits. Your health care provider may recommend a high-fiber diet to help:  Prevent constipation. Fiber can make your bowel movements more regular.  Lower your cholesterol.  Relieve the following conditions: ? Swelling of veins in the anus (hemorrhoids). ? Swelling and irritation (inflammation) of specific areas of the digestive tract (uncomplicated diverticulosis). ? A problem of the large intestine (colon) that sometimes causes pain and diarrhea (irritable bowel syndrome, IBS).  Prevent overeating as part of a weight-loss plan.  Prevent heart disease, type 2 diabetes, and certain cancers. What is my plan? The recommended daily fiber intake in grams (g) includes:  38 g for men age 83 or younger.  30 g for men over age 26.  25 g for women age 22 or younger.  21 g for women over age 86. You can get the recommended daily intake of dietary fiber by:  Eating a variety of fruits, vegetables, grains, and beans.  Taking a fiber supplement, if it is not possible to get enough  fiber through your diet. What do I need to know about a high-fiber diet?  It is better to get fiber through food sources rather than from fiber supplements. There is not a lot of research about how effective supplements are.  Always check the fiber content on the nutrition facts label of any prepackaged food. Look for foods that contain 5 g of fiber or more per serving.  Talk with a diet and nutrition specialist (dietitian) if you have questions about specific foods that are recommended or not recommended for your medical condition, especially if those foods are not listed below.  Gradually increase how much fiber you consume. If you increase your intake of dietary fiber too quickly, you may have bloating, cramping, or gas.  Drink plenty of water. Water helps you to digest fiber. What are tips for following this plan?  Eat a wide variety of high-fiber foods.  Make sure that half of the grains that you eat each day are whole grains.  Eat breads and cereals that are made with whole-grain flour instead of refined flour or white flour.  Eat brown rice, bulgur wheat, or millet instead of white rice.  Start the day with a breakfast that is high in fiber, such as a cereal that contains 5 g of fiber or more per serving.  Use beans in place of meat in soups, salads, and pasta dishes.  Eat high-fiber snacks, such as berries, raw vegetables, nuts, and popcorn.  Choose whole fruits and vegetables instead of processed forms like juice or sauce. What foods can I eat?  Fruits Berries. Pears. Apples. Oranges. Avocado. Prunes and raisins. Dried figs. Vegetables Sweet potatoes. Spinach. Kale. Artichokes. Cabbage. Broccoli. Cauliflower. Green peas. Carrots. Squash. Grains Whole-grain breads. Multigrain cereal. Oats and oatmeal. Brown rice. Barley. Bulgur wheat. Millet. Quinoa. Bran muffins. Popcorn. Rye wafer crackers. Meats and other proteins Navy, kidney, and pinto beans. Soybeans. Split peas.  Lentils. Nuts and seeds. Dairy Fiber-fortified yogurt. Beverages Fiber-fortified soy milk. Fiber-fortified orange juice. Other foods Fiber bars. The items listed above may not be a complete list of recommended foods and beverages. Contact a dietitian for more options. What foods are not recommended? Fruits Fruit juice. Cooked, strained fruit. Vegetables Fried potatoes. Canned vegetables. Well-cooked vegetables. Grains White bread. Pasta made with refined flour. White rice. Meats and other proteins Fatty cuts of meat. Fried chicken or fried fish. Dairy Milk. Yogurt. Cream cheese. Sour cream. Fats and oils Butters. Beverages Soft drinks. Other foods Cakes and pastries. The items listed above may not be a complete list of foods and beverages to avoid. Contact a dietitian for more information. Summary  Fiber is a type of carbohydrate. It is found in fruits, vegetables, whole grains, and beans.  There are many health benefits of eating a high-fiber diet, such as preventing constipation, lowering blood cholesterol, helping with weight loss, and reducing your risk of heart disease, diabetes, and certain cancers.  Gradually increase your intake of fiber. Increasing too fast can result in cramping, bloating, and gas. Drink plenty of water while you increase your fiber.  The best sources of fiber include whole fruits and vegetables, whole grains, nuts, seeds, and beans. This information is not intended to replace advice given to you by your health care provider. Make sure you discuss any questions you have with your health care provider. Document Revised: 10/01/2017 Document Reviewed: 10/01/2017 Elsevier Patient Education  2020 [employer]

## 2020-09-10 LAB — CBC WITH DIFFERENTIAL/PLATELET
Absolute Monocytes: 318 cells/uL (ref 200–950)
Basophils Absolute: 22 cells/uL (ref 0–200)
Basophils Relative: 0.5 %
Eosinophils Absolute: 77 cells/uL (ref 15–500)
Eosinophils Relative: 1.8 %
HCT: 42.2 % (ref 35.0–45.0)
Hemoglobin: 14.2 g/dL (ref 11.7–15.5)
Lymphs Abs: 2292 cells/uL (ref 850–3900)
MCH: 32.9 pg (ref 27.0–33.0)
MCHC: 33.6 g/dL (ref 32.0–36.0)
MCV: 97.7 fL (ref 80.0–100.0)
MPV: 11 fL (ref 7.5–12.5)
Monocytes Relative: 7.4 %
Neutro Abs: 1591 cells/uL (ref 1500–7800)
Neutrophils Relative %: 37 %
Platelets: 162 10*3/uL (ref 140–400)
RBC: 4.32 10*6/uL (ref 3.80–5.10)
RDW: 12.1 % (ref 11.0–15.0)
Total Lymphocyte: 53.3 %
WBC: 4.3 10*3/uL (ref 3.8–10.8)

## 2020-09-10 LAB — LIPID PANEL
Cholesterol: 142 mg/dL (ref <200)
HDL: 52 mg/dL (ref >=50)
LDL Cholesterol (Calc): 71 mg/dL (calc)
Non-HDL Cholesterol (Calc): 90 mg/dL (calc) (ref <130)
Total CHOL/HDL Ratio: 2.7 (calc) (ref <5.0)
Triglycerides: 104 mg/dL (ref <150)

## 2020-09-10 LAB — MAGNESIUM: Magnesium: 2.1 mg/dL (ref 1.5–2.5)

## 2020-09-10 LAB — URINALYSIS, ROUTINE W REFLEX MICROSCOPIC
Bilirubin Urine: NEGATIVE
Glucose, UA: NEGATIVE
Hgb urine dipstick: NEGATIVE
Hyaline Cast: NONE SEEN /LPF
Ketones, ur: NEGATIVE
Nitrite: NEGATIVE
Specific Gravity, Urine: 1.028 (ref 1.001–1.03)
Squamous Epithelial / HPF: NONE SEEN /HPF (ref <=5)
pH: 5.5 (ref 5.0–8.0)

## 2020-09-10 LAB — COMPLETE METABOLIC PANEL WITH GFR
AG Ratio: 2 (calc) (ref 1.0–2.5)
ALT: 101 U/L — ABNORMAL HIGH (ref 6–29)
AST: 94 U/L — ABNORMAL HIGH (ref 10–35)
Albumin: 4.5 g/dL (ref 3.6–5.1)
Alkaline phosphatase (APISO): 99 U/L (ref 37–153)
BUN: 13 mg/dL (ref 7–25)
CO2: 30 mmol/L (ref 20–32)
Calcium: 9.7 mg/dL (ref 8.6–10.4)
Chloride: 107 mmol/L (ref 98–110)
Creat: 0.91 mg/dL (ref 0.50–0.99)
GFR, Est African American: 78 mL/min/{1.73_m2} (ref >=60)
GFR, Est Non African American: 68 mL/min/{1.73_m2} (ref >=60)
Globulin: 2.3 g/dL (calc) (ref 1.9–3.7)
Glucose, Bld: 87 mg/dL (ref 65–99)
Potassium: 4.3 mmol/L (ref 3.5–5.3)
Sodium: 143 mmol/L (ref 135–146)
Total Bilirubin: 0.3 mg/dL (ref 0.2–1.2)
Total Protein: 6.8 g/dL (ref 6.1–8.1)

## 2020-09-10 LAB — MICROALBUMIN / CREATININE URINE RATIO
Creatinine, Urine: 275 mg/dL (ref 20–275)
Microalb Creat Ratio: 6 mcg/mg creat (ref <30)
Microalb, Ur: 1.7 mg/dL

## 2020-09-10 LAB — VITAMIN D 25 HYDROXY (VIT D DEFICIENCY, FRACTURES): Vit D, 25-Hydroxy: 86 ng/mL (ref 30–100)

## 2020-09-10 LAB — TSH: TSH: 1.27 mIU/L (ref 0.40–4.50)

## 2020-09-10 LAB — HEMOGLOBIN A1C
Hgb A1c MFr Bld: 6 % of total Hgb — ABNORMAL HIGH (ref <5.7)
Mean Plasma Glucose: 126 (calc)
eAG (mmol/L): 7 (calc)

## 2020-09-24 ENCOUNTER — Other Ambulatory Visit: Payer: Self-pay | Admitting: Internal Medicine

## 2020-09-24 ENCOUNTER — Other Ambulatory Visit: Payer: Self-pay | Admitting: Adult Health

## 2020-09-24 MED ORDER — OMEPRAZOLE 20 MG PO CPDR: DELAYED_RELEASE_CAPSULE | ORAL | 0 refills | Status: DC

## 2020-10-07 ENCOUNTER — Ambulatory Visit (INDEPENDENT_AMBULATORY_CARE_PROVIDER_SITE_OTHER): Payer: Managed Care, Other (non HMO)

## 2020-10-07 ENCOUNTER — Other Ambulatory Visit: Payer: Self-pay

## 2020-10-07 DIAGNOSIS — I10 Essential (primary) hypertension: Secondary | ICD-10-CM | POA: Diagnosis not present

## 2020-10-07 NOTE — Progress Notes (Addendum)
Patient presents to the office for a nurse visit to have her blood pressure checked.  BP reading was 136/82. States that she is taking her  BP meds as prescribed.  During her visit she had stated that she has been really depressed, feeling down. Doesn't think that her Lexapro is working anymore. Provider will be made aware and will follow up accordingly.

## 2020-10-15 NOTE — Progress Notes (Signed)
Assessment and Plan:  Kaitlyn [name] was seen today for medication management.  Diagnoses and all orders for this visit:  Reactive depression Insomnia, unspecified type Reactive depression following fall at work with back pain Has been stable with lexapro for many years; will continue this agent She feels insomnia is significant concern After discussion will cautiously try adding low dose trazodone; 25 mg daily initially and observe; discussed risk of SS; stop med immediately if any concerning sx; max 50 mg If working but needing to titrate up any further will taper down lexapro dose If daytime energy/motivation continues to be an issue consider taper off lexapro to wellbutrin Discussed CBT; encouraged to reach out to initiate this; Denies SI/HI;  Lifestyle discussed: diet/exerise, sleep hygiene, stress management, hydration, increase daytime activity  Follow up in 3-4 weeks or sooner if any concerns -     traZODone (DESYREL) 50 MG tablet; 1/2-1 tablet 1 hour prior to bedtime for sleep  Further disposition pending results of labs. Discussed med's effects and SE's.   Over 30 minutes of exam, counseling, chart review, and critical decision making was performed.   Future Appointments  Date Time Provider Department Center  01/10/2021  4:00 PM Judd Gaudier, NP [facility] None  09/12/2021  2:00 PM Judd Gaudier, NP [facility] None    ------------------------------------------------------------------------------------------------------------------   HPI 64 y.o.female presents for evaluation of depression and insomnia.   Hx of depression/anxiety, has been on lexapro 20 mg daily with good results for many years until recently.   She recently slipped and fell at work, was evaluated by ED 09/01/2020 with benign evaluation/imaging, saw workman's comp following who felt likely mild concussion, was out of work for 2 weeks. Also with lower back pain, has been following with ortho, was recommended PT,  going 2-3 days a week and does feel this is helping. She reports was prescribed ibuprofen 800 mg TID, PRN (takes only on days she works) and tizanidine 4 mg at HS, also taking gabapentin 600 mg in PM though unsure how much this is helping.   She reports since her fall she has felt down, poor motivation to do anything, often will just stay in bed all day, poor appetite. She finds her thoughts keeping her from falling asleep.  She denies ETOH/drug use, lives by herself, does have a friend who is supportive who she speaks with regularly, who recommended she reach out to [place] for help.   PHQ- 9 of 16 today.   Past Medical History:  Diagnosis Date  . Allergy   . Anxiety   . Depression   . Esophageal stricture   . GERD (gastroesophageal reflux disease)   . History of COVID-19 02/23/2020  . Hyperlipemia   . Hypertension   . Unspecified vitamin D deficiency      Allergies  Allergen Reactions  . 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 Clinoril [Sulindac] Rash    Current Outpatient Medications on File Prior to Visit  Medication Sig  . albuterol (VENTOLIN HFA) 108 (90 Base) MCG/ACT inhaler Inhale 2 puffs into the lungs every 4 (four) hours as needed for wheezing or shortness of breath.  Marland Kitchen aspirin 81 MG tablet Take 81 mg by mouth daily.  Marland Kitchen azelastine (ASTELIN) 0.1 % nasal spray PLACE 2 SPRAYS INTO BOTH NOSTRILS 2 (TWO) TIMES DAILY. USE IN EACH NOSTRIL AS DIRECTED  . Biotin 300 MCG TABS Take 1 tablet by mouth at bedtime.   . butalbital-acetaminophen-caffeine (FIORICET)  50-325-40 MG tablet Take 1 tablet by mouth every 6 (six) hours as needed for headache.  . CHOLECALCIFEROL PO Take 10,000 Units by mouth daily.  Marland Kitchen escitalopram (LEXAPRO) 20 MG tablet TAKE ONE TABLET BY MOUTH DAILY FOR MOOD  . gabapentin (NEURONTIN) 300 MG capsule Take 1 capsule 3 x /day for Chronic Pain  . ibuprofen (ADVIL) 800 MG tablet Take 800 mg by mouth every 8 (eight) hours as needed.   . latanoprost (XALATAN) 0.005 % ophthalmic solution Place 1 drop into both eyes at bedtime.  Marland Kitchen losartan-hydrochlorothiazide (HYZAAR) 100-25 MG tablet Take 1 tablet by mouth daily.  . Magnesium 250 MG TABS Take 1 tablet by mouth 2 (two) times daily.   . potassium chloride (KLOR-CON) 10 MEQ tablet TAKE TWO TABLETS BY MOUTH DAILY FOR POTASSIUM  . rosuvastatin (CRESTOR) 40 MG tablet TAKE 1 TABLET DAILY FOR CHOLESTEROL  . tiZANidine (ZANAFLEX) 4 MG tablet Take 4 mg by mouth at bedtime.  . fluticasone (FLONASE) 50 MCG/ACT nasal spray SPRAY TWO SPRAYS IN EACH NOSTRIL ONCE DAILY (Patient not taking: Reported on 09/09/2020)  . metoprolol succinate (TOPROL-XL) 25 MG 24 hr tablet Take 25 mg by mouth daily.  Marland Kitchen omeprazole (PRILOSEC) 20 MG capsule Take     1 capsule     at Bedtime      for Acid Indigestion & Reflux   No current facility-administered medications on file prior to visit.    ROS: all negative except above.   Physical Exam:  BP 136/76   Pulse 70   Temp (!) 97.3 F (36.3 C)   Wt 157 lb 12.8 oz (71.6 kg)   SpO2 99%   BMI 27.95 kg/m   General Appearance: Well nourished, in no apparent distress. Eyes: PERRLA, conjunctiva no swelling or erythema ENT/Mouth: Ext aud canals clear, TMs without erythema, bulging. No erythema, swelling, or exudate on post pharynx.  Tonsils not swollen or erythematous. Hearing normal.  Neck: Supple, thyroid normal.  Respiratory: Respiratory effort normal, BS equal bilaterally without rales, rhonchi, wheezing or stridor.  Cardio: RRR with no MRGs. Brisk peripheral pulses without edema.  Musculoskeletal:  normal gait.  Skin: Warm, dry without rashes, lesions, ecchymosis.  Neuro:  Normal muscle tone, Sensation intact.  Psych: Awake and oriented X 3, depressed affect, Insight and Judgment appropriate.     Dan Maker, NP 5:14 PM [facility]

## 2020-10-18 ENCOUNTER — Encounter: Payer: Self-pay | Admitting: Adult Health

## 2020-10-18 ENCOUNTER — Ambulatory Visit: Payer: Managed Care, Other (non HMO) | Admitting: Adult Health

## 2020-10-18 ENCOUNTER — Other Ambulatory Visit: Payer: Self-pay

## 2020-10-18 VITALS — BP 136/76 | HR 70 | Temp 97.3°F | Wt 157.8 lb

## 2020-10-18 DIAGNOSIS — G4700 Insomnia, unspecified: Secondary | ICD-10-CM

## 2020-10-18 DIAGNOSIS — M545 Low back pain, unspecified: Secondary | ICD-10-CM

## 2020-10-18 DIAGNOSIS — F329 Major depressive disorder, single episode, unspecified: Secondary | ICD-10-CM | POA: Diagnosis not present

## 2020-10-18 DIAGNOSIS — F330 Major depressive disorder, recurrent, mild: Secondary | ICD-10-CM

## 2020-10-18 MED ORDER — TRAZODONE HCL 50 MG PO TABS
ORAL_TABLET | ORAL | 2 refills | Status: DC
Start: 2020-10-18 — End: 2021-01-14

## 2020-10-18 NOTE — Patient Instructions (Signed)
Suggest looking into cognitive behavioral therapy   Can check back of insurance card for phone number or website    Please be aware that some of the medications that you are on can sometimes cause a rare and potentially dangerous adverse reaction, called SEROTONIN SYNDROME: Symptoms of this condition include (but are not limited to):  Agitation or restlessness, confusion, rapid heart rate and high blood pressure, dilated pupils, loss of muscle coordination or twitching muscles, muscle rigidity/stiffness, sweating and/or flushing, diarrhea, headache, shivering, goose bumps. If you have any of these symptoms you may have to stop the medication. Call your health care provider immediately.  Severe serotonin syndrome can be life-threatening emergency. Signs and symptoms of a severe reaction may include: high fever, seizures, irregular heartbeat, unconsciousness or altered level of awareness or personality changes.  If you have any of these new symptoms, call 911 or have someone take you to the emergency room.    Major Depressive Disorder, Adult Major depressive disorder (MDD) is a mental health condition. MDD often makes you feel sad, hopeless, or helpless. MDD can also cause symptoms in your body. MDD can affect your:  Work.  School.  Relationships.  Other normal activities. MDD can range from mild to very bad. It may occur once (single episode MDD). It can also occur many times (recurrent MDD). The main symptoms of MDD often include:  Feeling sad, depressed, or irritable most of the time.  Loss of interest. MDD symptoms also include:  Sleeping too much or too little.  Eating too much or too little.  A change in your weight.  Feeling tired (fatigue) or having low energy.  Feeling worthless.  Feeling guilty.  Trouble making decisions.  Trouble thinking clearly.  Thoughts of suicide or harming others.  Feeling weak.  Feeling agitated.  Keeping yourself from being around  other people (isolation). Follow these instructions at home: Activity  Do these things as told by your doctor: ? Go back to your normal activities. ? Exercise regularly. ? Spend time outdoors. Alcohol  Talk with your doctor about how alcohol can affect your antidepressant medicines.  Do not drink alcohol. Or, limit how much alcohol you drink. ? This means no more than 1 drink a day for nonpregnant women and 2 drinks a day for men. One drink equals one of these:  12 oz of beer.  5 oz of wine.  1 oz of hard liquor. General instructions  Take over-the-counter and prescription medicines only as told by your doctor.  Eat a healthy diet.  Get plenty of sleep.  Find activities that you enjoy. Make time to do them.  Think about joining a support group. Your doctor may be able to suggest a group for you.  Keep all follow-up visits as told by your doctor. This is important. Where to find more information:  [employer] on Mental Illness: ? www.nami.org  [place] [employer] of Mental Health: ? http://www.maynard.net/  National Suicide Prevention Lifeline: ? (321) 530-4134. This is free, 24-hour help. Contact a doctor if:  Your symptoms get worse.  You have new symptoms. Get help right away if:  You self-harm.  You see, hear, taste, smell, or feel things that are not present (hallucinate). If you ever feel like you may hurt yourself or others, or have thoughts about taking your own life, get help right away. You can go to your nearest emergency department or call:  Your local emergency services (911 in the [place]  A suicide crisis helpline, such  as the National Suicide Prevention Lifeline: ? 2252342816. This is open 24 hours a day. This information is not intended to replace advice given to you by your health care provider. Make sure you discuss any questions you have with your health care provider. Document Revised: 11/09/2017 Document Reviewed:  08/13/2016 Elsevier Patient Education  2020 [employer].   Trazodone tablets What is this medicine? TRAZODONE (TRAZ oh done) is used to treat depression. This medicine may be used for other purposes; ask your health care provider or pharmacist if you have questions. COMMON BRAND NAME(S): Desyrel What should I tell my health care provider before I take this medicine? They need to know if you have any of these conditions:  attempted suicide or thinking about it  bipolar disorder  bleeding problems  glaucoma  heart disease, or previous heart attack  irregular heart beat  kidney or liver disease  low levels of sodium in the blood  an unusual or allergic reaction to trazodone, other medicines, foods, dyes or preservatives  pregnant or trying to get pregnant  breast-feeding How should I use this medicine? Take this medicine by mouth with a glass of water. Follow the directions on the prescription label. Take this medicine shortly after a meal or a light snack. Take your medicine at regular intervals. Do not take your medicine more often than directed. Do not stop taking this medicine suddenly except upon the advice of your doctor. Stopping this medicine too quickly may cause serious side effects or your condition may worsen. A special MedGuide will be given to you by the pharmacist with each prescription and refill. Be sure to read this information carefully each time. Talk to your pediatrician regarding the use of this medicine in children. Special care may be needed. Overdosage: If you think you have taken too much of this medicine contact a poison control center or emergency room at once. NOTE: This medicine is only for you. Do not share this medicine with others. What if I miss a dose? If you miss a dose, take it as soon as you can. If it is almost time for your next dose, take only that dose. Do not take double or extra doses. What may interact with this medicine? Do not  take this medicine with any of the following medications:  certain medicines for fungal infections like fluconazole, itraconazole, ketoconazole, posaconazole, voriconazole  cisapride  dronedarone  linezolid  MAOIs like Carbex, Eldepryl, Marplan, Nardil, and Parnate  mesoridazine  methylene blue (injected into a vein)  pimozide  saquinavir  thioridazine This medicine may also interact with the following medications:  alcohol  antiviral medicines for HIV or AIDS  aspirin and aspirin-like medicines  barbiturates like phenobarbital  certain medicines for blood pressure, heart disease, irregular heart beat  certain medicines for depression, anxiety, or psychotic disturbances  certain medicines for migraine headache like almotriptan, eletriptan, frovatriptan, naratriptan, rizatriptan, sumatriptan, zolmitriptan  certain medicines for seizures like carbamazepine and phenytoin  certain medicines for sleep  certain medicines that treat or prevent blood clots like dalteparin, enoxaparin, warfarin  digoxin  fentanyl  lithium  NSAIDS, medicines for pain and inflammation, like ibuprofen or naproxen  other medicines that prolong the QT interval (cause an abnormal heart rhythm) like dofetilide  rasagiline  supplements like St. John's wort, kava kava, valerian  tramadol  tryptophan This list may not describe all possible interactions. Give your health care provider a list of all the medicines, herbs, non-prescription drugs, or dietary supplements you use.  Also tell them if you smoke, drink alcohol, or use illegal drugs. Some items may interact with your medicine. What should I watch for while using this medicine? Tell your doctor if your symptoms do not get better or if they get worse. Visit your doctor or health care professional for regular checks on your progress. Because it may take several weeks to see the full effects of this medicine, it is important to continue  your treatment as prescribed by your doctor. Patients and their families should watch out for new or worsening thoughts of suicide or depression. Also watch out for sudden changes in feelings such as feeling anxious, agitated, panicky, irritable, hostile, aggressive, impulsive, severely restless, overly excited and hyperactive, or not being able to sleep. If this happens, especially at the beginning of treatment or after a change in dose, call your health care professional. Bonita Quin may get drowsy or dizzy. Do not drive, use machinery, or do anything that needs mental alertness until you know how this medicine affects you. Do not stand or sit up quickly, especially if you are an older patient. This reduces the risk of dizzy or fainting spells. Alcohol may interfere with the effect of this medicine. Avoid alcoholic drinks. This medicine may cause dry eyes and blurred vision. If you wear contact lenses you may feel some discomfort. Lubricating drops may help. See your eye doctor if the problem does not go away or is severe. Your mouth may get dry. Chewing sugarless gum, sucking hard candy and drinking plenty of water may help. Contact your doctor if the problem does not go away or is severe. What side effects may I notice from receiving this medicine? Side effects that you should report to your doctor or health care professional as soon as possible:  allergic reactions like skin rash, itching or hives, swelling of the face, lips, or tongue  elevated mood, decreased need for sleep, racing thoughts, impulsive behavior  confusion  fast, irregular heartbeat  feeling faint or lightheaded, falls  feeling agitated, angry, or irritable  loss of balance or coordination  painful or prolonged erections  restlessness, pacing, inability to keep still  suicidal thoughts or other mood changes  tremors  trouble sleeping  seizures  unusual bleeding or bruising Side effects that usually do not require  medical attention (report to your doctor or health care professional if they continue or are bothersome):  change in sex drive or performance  change in appetite or weight  constipation  headache  muscle aches or pains  nausea This list may not describe all possible side effects. Call your doctor for medical advice about side effects. You may report side effects to FDA at 1-800-FDA-1088. Where should I keep my medicine? Keep out of the reach of children. Store at room temperature between 15 and 30 degrees C (59 to 86 degrees F). Protect from light. Keep container tightly closed. Throw away any unused medicine after the expiration date. NOTE: This sheet is a summary. It may not cover all possible information. If you have questions about this medicine, talk to your doctor, pharmacist, or health care provider.  2020 Elsevier/Gold Standard (2018-11-19 11:46:46)

## 2020-11-08 ENCOUNTER — Other Ambulatory Visit: Payer: Self-pay

## 2020-11-08 DIAGNOSIS — E782 Mixed hyperlipidemia: Secondary | ICD-10-CM

## 2020-11-08 MED ORDER — ESCITALOPRAM OXALATE 20 MG PO TABS: ORAL_TABLET | ORAL | 0 refills | Status: DC

## 2020-11-08 MED ORDER — ROSUVASTATIN CALCIUM 40 MG PO TABS: 40.0000 mg | ORAL_TABLET | Freq: Every day | ORAL | 3 refills | Status: DC

## 2020-12-11 ENCOUNTER — Other Ambulatory Visit: Payer: Self-pay | Admitting: Internal Medicine

## 2020-12-11 DIAGNOSIS — M25562 Pain in left knee: Secondary | ICD-10-CM

## 2020-12-11 DIAGNOSIS — G8929 Other chronic pain: Secondary | ICD-10-CM

## 2020-12-11 MED ORDER — GABAPENTIN 300 MG PO CAPS
ORAL_CAPSULE | ORAL | 0 refills | Status: DC
Start: 2020-12-11 — End: 2021-02-01

## 2021-01-10 ENCOUNTER — Ambulatory Visit: Payer: Managed Care, Other (non HMO) | Admitting: Adult Health

## 2021-01-14 ENCOUNTER — Other Ambulatory Visit: Payer: Self-pay | Admitting: Adult Health

## 2021-01-31 NOTE — Progress Notes (Signed)
FOLLOW UP  Assessment and Plan:   Atherosclerosis of aorta Control blood pressure, cholesterol, glucose, increase exercise.   Hypertension Stop losartan/HCTZ, start olmesartan 40 mg, continue toprol 25 mg daily  Recheck NV 1 month with BMP/GFR Monitor blood pressure at home; patient to call if consistently greater than 130/80 Continue DASH diet.   Reminder to go to the ER if any CP, SOB, nausea, dizziness, severe HA, changes vision/speech, left arm numbness and tingling and jaw pain.  Cholesterol Currently at goal; continue rosuvastatin 40 mg daily  Continue low cholesterol diet and exercise.  Check lipid panel.   Prediabetes Continue diet and exercise.  Perform daily foot/skin check, notify office of any concerning changes.  Check A1C q36m; monitor weight, serum glucose  Overweight with comorbidities Commended on weight loss progress thus far Long discussion about weight loss, diet, and exercise Recommended diet heavy in fruits and veggies and low in animal meats, cheeses, and dairy products, appropriate calorie intake Discussed ideal weight for height and initial weight goal (<150lb) Start weighing weekly, STOP soda Will follow up in 3 months  Vitamin D Def At goal at last check continue supplementation to maintain goal of 60-100 Defer Vit D level to next CPE  Depression/anxiety Continue medications, lexapro and trazodone helpful  Lifestyle discussed: diet/exerise, sleep hygiene, stress management, hydration  GERD/ hx of esophageal stricture Denies stricture sx, but unmanaged reflux Previously had breakthrough with famotidine; restart omeprazole 20 mg daily  Discussed diet, avoiding triggers and other lifestyle changes  Allergic rhinitis Hygiene discussed, restart zyrtec  Fatty liver Weight loss advised, avoid alcohol/tylenol, discussed low processed carbohydrate diet will monitor LFTs   Continue diet and meds as discussed. Further disposition pending results  of labs. Discussed med's effects and SE's.   Over 30 minutes of exam, counseling, chart review, and critical decision making was performed.   Future Appointments  Date Time Provider Department Center  03/01/2021  4:30 PM [facility] NURSE [facility] None  06/02/2021 11:00 AM Judd Gaudier, NP [facility] None  09/12/2021  2:00 PM Judd Gaudier, NP [facility] None    ----------------------------------------------------------------------------------------------------------------------  HPI 65 y.o. female  presents for 3 month follow up on hypertension, cholesterol, prediabetes, weight, GERD, anxiety and vitamin D deficiency.   She was evaluated at ED 09/01/2020 after slipped, fell at work and hit head; CT head showed no concerning acute findings. CT cervical spine showed spondylosis, mild/mod stenosis at C3-C6 . Multilevel bony neural foraminal narrowing. She reports achy pain, taking tizanidine with benefit. Also taking gabapentin 600 mg at night but doesn't perceive benefit. Taking tylenol in AM which helps more.    She is on lexapro for recurrent depression and feels this is helpful, trazodone was added last visit and feels this is helping   Hx of GERD/stricture/dysphagia s/p dilation, denies dysphagia, admits hasn't been taking omprazole, has persistent cough, intermittent reflux, increased bloating worse in the last month.   Also notes rhinitis, post nasal drip, typically has allergies in spring, not currently taking antihistamine. Zyrtec has worked well in the past.   She had abd/pelvis CT in 05/2020 for abdominal pain; incidentally showed 1.6 cm intermediate attenuation lesions in region of L adnexa recommended for pelvic ultrasound which was ordered 06/2020, patient hadn't scheduled citing bill from Ct but today is agreeable to proceed with this. Will have referral coordinator schedule -   BMI is Body mass index is 26.75 kg/m., she is working on diet though, had poor appetite though covid  19, still eating less. Goal  weight is 140 lb.  Wt Readings from Last 3 Encounters:  02/01/21 151 lb (68.5 kg)  10/18/20 157 lb 12.8 oz (71.6 kg)  10/07/20 156 lb (70.8 kg)   She admits hasn't been checking BP at home, does have a cuff, today their BP is BP: 140/80  proteinuria/microalbuminuria with prediabetes, currently taking losartan/HCTZ 100/25, she was taking potassium supplement 20-30 mEq, reports stopped taking this.    She does not workout. She denies chest pain, shortness of breath, dizziness.  Aortic atherosclerosis per CT 05/2020.    She is on cholesterol medication Rosuvastatin 40 mg daily and denies myalgias. Her cholesterol is at goal. The cholesterol last visit was:   Lab Results  Component Value Date   CHOL 142 09/09/2020   HDL 52 09/09/2020   LDLCALC 71 09/09/2020   TRIG 104 09/09/2020   CHOLHDL 2.7 09/09/2020    She has been working on diet and exercise for prediabetes, and denies increased appetite, nausea, paresthesia of the feet, polydipsia, polyuria and visual disturbances. Last A1C in the office was:  Lab Results  Component Value Date   HGBA1C 6.0 (H) 09/09/2020   Patient is on Vitamin D supplement.   Lab Results  Component Value Date   VD25OH 86 09/09/2020     Lab Results  Component Value Date   GFRAA 78 09/09/2020   She has new mild LFT elevations last year; hepatitis panel was negative 08/20/2019, [place] 12/2019 showed diffuse hepatic steatosis; normal iron 02/2020.  Lab Results  Component Value Date   ALT 101 (H) 09/09/2020   AST 94 (H) 09/09/2020   ALKPHOS 81 06/28/2017   BILITOT 0.3 09/09/2020     Current Medications:  Current Outpatient Medications on File Prior to Visit  Medication Sig  . albuterol (VENTOLIN HFA) 108 (90 Base) MCG/ACT inhaler Inhale 2 puffs into the lungs every 4 (four) hours as needed for wheezing or shortness of breath.  Marland Kitchen aspirin 81 MG tablet Take 81 mg by mouth daily.  Marland Kitchen azelastine (ASTELIN) 0.1 % nasal spray PLACE 2 SPRAYS  INTO BOTH NOSTRILS 2 (TWO) TIMES DAILY. USE IN EACH NOSTRIL AS DIRECTED  . Biotin 300 MCG TABS Take 1 tablet by mouth at bedtime.   . butalbital-acetaminophen-caffeine (FIORICET) 50-325-40 MG tablet Take 1 tablet by mouth every 6 (six) hours as needed for headache.  . CHOLECALCIFEROL PO Take 10,000 Units by mouth daily.  Marland Kitchen escitalopram (LEXAPRO) 20 MG tablet TAKE ONE TABLET BY MOUTH DAILY FOR MOOD  . ibuprofen (ADVIL) 800 MG tablet Take 800 mg by mouth every 8 (eight) hours as needed.  . latanoprost (XALATAN) 0.005 % ophthalmic solution Place 1 drop into both eyes at bedtime.  . Magnesium 250 MG TABS Take 1 tablet by mouth 2 (two) times daily.   . metoprolol succinate (TOPROL-XL) 25 MG 24 hr tablet Take 25 mg by mouth daily.  Marland Kitchen omeprazole (PRILOSEC) 20 MG capsule Take     1 capsule     at Bedtime      for Acid Indigestion & Reflux  . rosuvastatin (CRESTOR) 40 MG tablet Take 1 tablet (40 mg total) by mouth daily. for cholesterol  . traZODone (DESYREL) 50 MG tablet TAKE ONE-HALF TO ONE TABLET BY MOUTH ONE HOUR PRIOR TO BEDTIME FOR SLEEP  . fluticasone (FLONASE) 50 MCG/ACT nasal spray SPRAY TWO SPRAYS IN EACH NOSTRIL ONCE DAILY (Patient not taking: No sig reported)   No current facility-administered medications on file prior to visit.     Allergies:  Allergies  Allergen Reactions  . 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 Clinoril [Sulindac] Rash     Medical History:  Past Medical History:  Diagnosis Date  . Allergy   . Anxiety   . Depression   . Esophageal stricture   . GERD (gastroesophageal reflux disease)   . History of COVID-19 02/23/2020  . Hyperlipemia   . Hypertension   . Unspecified vitamin D deficiency    Family history- Reviewed and unchanged Social history- Reviewed and unchanged   Review of Systems:  Review of Systems  Constitutional: Negative for malaise/fatigue and weight loss.  HENT: Negative for congestion, hearing  loss, sore throat and tinnitus.   Eyes: Negative for blurred vision and double vision.  Respiratory: Positive for cough (dry cough day and night). Negative for sputum production, shortness of breath and wheezing.   Cardiovascular: Negative for chest pain, palpitations, orthopnea, claudication and leg swelling.  Gastrointestinal: Positive for heartburn (intermittent). Negative for abdominal pain, blood in stool, constipation, diarrhea, melena, nausea and vomiting.       Bloating  Genitourinary: Negative.   Musculoskeletal: Positive for back pain. Negative for joint pain and myalgias.  Skin: Negative for rash.  Neurological: Negative for dizziness, tingling, sensory change, weakness and headaches.  Endo/Heme/Allergies: Positive for environmental allergies. Negative for polydipsia.  Psychiatric/Behavioral: Negative for depression, substance abuse and suicidal ideas. The patient is not nervous/anxious and does not have insomnia.   All other systems reviewed and are negative.    Physical Exam: BP 140/80   Pulse 73   Temp (!) 96.6 F (35.9 C)   Wt 151 lb (68.5 kg)   SpO2 99%   BMI 26.75 kg/m  Wt Readings from Last 3 Encounters:  02/01/21 151 lb (68.5 kg)  10/18/20 157 lb 12.8 oz (71.6 kg)  10/07/20 156 lb (70.8 kg)   General Appearance: Well nourished, in no apparent distress. Eyes: PERRLA, EOMs, conjunctiva no swelling or erythema Sinuses: No Frontal/maxillary tenderness ENT/Mouth: Ext aud canals clear, TMs without erythema, bulging. No erythema, swelling, or exudate on post pharynx.  Tonsils not swollen or erythematous. Hearing normal.  Neck: Supple, thyroid normal.  Respiratory: Respiratory effort normal, BS equal bilaterally without rales, rhonchi, wheezing or stridor.  Cardio: RRR with no MRGs. Brisk peripheral pulses without edema.  Abdomen: Soft, + BS.  Non tender, no guarding, rebound, hernias, masses. Lymphatics: Non tender without lymphadenopathy.  Musculoskeletal: Full  ROM, 5/5 strength, Normal gait. Neg straight leg raise -  Skin: Warm, dry without rashes, lesions, ecchymosis.  Neuro: Cranial nerves intact. No cerebellar symptoms.  Psych: Awake and oriented X 3, normal affect, Insight and Judgment appropriate.    Dan Maker, NP 1:42 PM [facility]

## 2021-02-01 ENCOUNTER — Other Ambulatory Visit: Payer: Self-pay

## 2021-02-01 ENCOUNTER — Other Ambulatory Visit: Payer: Self-pay | Admitting: Adult Health

## 2021-02-01 ENCOUNTER — Encounter: Payer: Self-pay | Admitting: Adult Health

## 2021-02-01 ENCOUNTER — Ambulatory Visit (INDEPENDENT_AMBULATORY_CARE_PROVIDER_SITE_OTHER): Payer: Managed Care, Other (non HMO) | Admitting: Adult Health

## 2021-02-01 VITALS — BP 140/80 | HR 73 | Temp 96.6°F | Wt 151.0 lb

## 2021-02-01 DIAGNOSIS — R935 Abnormal findings on diagnostic imaging of other abdominal regions, including retroperitoneum: Secondary | ICD-10-CM

## 2021-02-01 DIAGNOSIS — E559 Vitamin D deficiency, unspecified: Secondary | ICD-10-CM

## 2021-02-01 DIAGNOSIS — E663 Overweight: Secondary | ICD-10-CM

## 2021-02-01 DIAGNOSIS — E782 Mixed hyperlipidemia: Secondary | ICD-10-CM | POA: Diagnosis not present

## 2021-02-01 DIAGNOSIS — Z79899 Other long term (current) drug therapy: Secondary | ICD-10-CM

## 2021-02-01 DIAGNOSIS — R7309 Other abnormal glucose: Secondary | ICD-10-CM

## 2021-02-01 DIAGNOSIS — I700 Atherosclerosis of aorta: Secondary | ICD-10-CM

## 2021-02-01 DIAGNOSIS — F330 Major depressive disorder, recurrent, mild: Secondary | ICD-10-CM | POA: Diagnosis not present

## 2021-02-01 DIAGNOSIS — I10 Essential (primary) hypertension: Secondary | ICD-10-CM

## 2021-02-01 DIAGNOSIS — M799 Soft tissue disorder, unspecified: Secondary | ICD-10-CM

## 2021-02-01 MED ORDER — CETIRIZINE HCL 10 MG PO TABS: 10.0000 mg | ORAL_TABLET | Freq: Every day | ORAL | Status: AC

## 2021-02-01 MED ORDER — OLMESARTAN MEDOXOMIL 40 MG PO TABS: 40.0000 mg | ORAL_TABLET | Freq: Every day | ORAL | 1 refills | Status: DC

## 2021-02-01 MED ORDER — TIZANIDINE HCL 4 MG PO TABS
2.0000 mg | ORAL_TABLET | Freq: Three times a day (TID) | ORAL | 1 refills | Status: DC | PRN
Start: 2021-02-01 — End: 2021-03-31

## 2021-02-01 NOTE — Patient Instructions (Addendum)
For cough -  Please start on daily zyrtec (cetirizine) for 2 weeks and see if this helps cough If not resolved, please try adding omeprazole daily   Please STOP soda - for liver and prediabetes need to avoid sugar, high fructose corn syrup, white flour products, processed fruit (fresh, frozen is fine), alcohol  Please STOP losartan/HCTZ - START olmesartan daily instead for blood pressure - monitor numbers at home, goal is <130/80  Please STOP gabapentin if not helping your back pain, can try the tylenol at night if this helps during the day  Please get on sublingual B12 (dissolves in mouth) [phone] mcg daily and MONITOR to see if this seems to help with memory at all       [employer] Current Therapy 2021 (pp. 213-216). [place], [place]: Elsevier.">  Gastroesophageal Reflux Disease, Adult Gastroesophageal reflux (GER) happens when acid from the stomach flows up into the tube that connects the mouth and the stomach (esophagus). Normally, food travels down the esophagus and stays in the stomach to be digested. However, when a person has GER, food and stomach acid sometimes move back up into the esophagus. If this becomes a more serious problem, the person may be diagnosed with a disease called gastroesophageal reflux disease (GERD). GERD occurs when the reflux:  Happens often.  Causes frequent or severe symptoms.  Causes problems such as damage to the esophagus. When stomach acid comes in contact with the esophagus, the acid may cause inflammation in the esophagus. Over time, GERD may create small holes (ulcers) in the lining of the esophagus. What are the causes? This condition is caused by a problem with the muscle between the esophagus and the stomach (lower esophageal sphincter, or LES). Normally, the LES muscle closes after food passes through the esophagus to the stomach. When the LES is weakened or abnormal, it does not close properly, and that allows food and stomach acid to go back  up into the esophagus. The LES can be weakened by certain dietary substances, medicines, and medical conditions, including:  Tobacco use.  Pregnancy.  Having a hiatal hernia.  Alcohol use.  Certain foods and beverages, such as coffee, chocolate, onions, and peppermint. What increases the risk? You are more likely to develop this condition if you:  Have an increased body weight.  Have a connective tissue disorder.  Take NSAIDs, such as ibuprofen. What are the signs or symptoms? Symptoms of this condition include:  Heartburn.  Difficult or painful swallowing and the feeling of having a lump in the throat.  A bitter taste in the mouth.  Bad breath and having a large amount of saliva.  Having an upset or bloated stomach and belching.  Chest pain. Different conditions can cause chest pain. Make sure you see your health care provider if you experience chest pain.  Shortness of breath or wheezing.  Ongoing (chronic) cough or a nighttime cough.  Wearing away of tooth enamel.  Weight loss. How is this diagnosed? This condition may be diagnosed based on a medical history and a physical exam. To determine if you have mild or severe GERD, your health care provider may also monitor how you respond to treatment. You may also have tests, including:  A test to examine your stomach and esophagus with a small camera (endoscopy).  A test that measures the acidity level in your esophagus.  A test that measures how much pressure is on your esophagus.  A barium swallow or modified barium swallow test to show the  shape, size, and functioning of your esophagus. How is this treated? Treatment for this condition may vary depending on how severe your symptoms are. Your health care provider may recommend:  Changes to your diet.  Medicine.  Surgery. The goal of treatment is to help relieve your symptoms and to prevent complications. Follow these instructions at home: Eating and  drinking  Follow a diet as recommended by your health care provider. This may involve avoiding foods and drinks such as: ? Coffee and tea, with or without caffeine. ? Drinks that contain alcohol. ? Energy drinks and sports drinks. ? Carbonated drinks or sodas. ? Chocolate and cocoa. ? Peppermint and mint flavorings. ? Garlic and onions. ? Horseradish. ? Spicy and acidic foods, including peppers, chili powder, curry powder, vinegar, hot sauces, and barbecue sauce. ? Citrus fruit juices and citrus fruits, such as oranges, lemons, and limes. ? Tomato-based foods, such as red sauce, chili, salsa, and pizza with red sauce. ? Fried and fatty foods, such as donuts, french fries, potato chips, and high-fat dressings. ? High-fat meats, such as hot dogs and fatty cuts of red and white meats, such as rib eye steak, sausage, ham, and bacon. ? High-fat dairy items, such as whole milk, butter, and cream cheese.  Eat small, frequent meals instead of large meals.  Avoid drinking large amounts of liquid with your meals.  Avoid eating meals during the 2-3 hours before bedtime.  Avoid lying down right after you eat.  Do not exercise right after you eat.   Lifestyle  Do not use any products that contain nicotine or tobacco. These products include cigarettes, chewing tobacco, and vaping devices, such as e-cigarettes. If you need help quitting, ask your health care provider.  Try to reduce your stress by using methods such as yoga or meditation. If you need help reducing stress, ask your health care provider.  If you are overweight, reduce your weight to an amount that is healthy for you. Ask your health care provider for guidance about a safe weight loss goal.   General instructions  Pay attention to any changes in your symptoms.  Take over-the-counter and prescription medicines only as told by your health care provider. Do not take aspirin, ibuprofen, or other NSAIDs unless your health care  provider told you to take these medicines.  Wear loose-fitting clothing. Do not wear anything tight around your waist that causes pressure on your abdomen.  Raise (elevate) the head of your bed about 6 inches (15 cm). You can use a wedge to do this.  Avoid bending over if this makes your symptoms worse.  Keep all follow-up visits. This is important. Contact a health care provider if:  You have: ? New symptoms. ? Unexplained weight loss. ? Difficulty swallowing or it hurts to swallow. ? Wheezing or a persistent cough. ? A hoarse voice.  Your symptoms do not improve with treatment. Get help right away if:  You have sudden pain in your arms, neck, jaw, teeth, or back.  You suddenly feel sweaty, dizzy, or light-headed.  You have chest pain or shortness of breath.  You vomit and the vomit is green, yellow, or black, or it looks like blood or coffee grounds.  You faint.  You have stool that is red, bloody, or black.  You cannot swallow, drink, or eat. These symptoms may represent a serious problem that is an emergency. Do not wait to see if the symptoms will go away. Get medical help right away. Call your  local emergency services (911 in the [place] Do not drive yourself to the hospital. Summary  Gastroesophageal reflux happens when acid from the stomach flows up into the esophagus. GERD is a disease in which the reflux happens often, causes frequent or severe symptoms, or causes problems such as damage to the esophagus.  Treatment for this condition may vary depending on how severe your symptoms are. Your health care provider may recommend diet and lifestyle changes, medicine, or surgery.  Contact a health care provider if you have new or worsening symptoms.  Take over-the-counter and prescription medicines only as told by your health care provider. Do not take aspirin, ibuprofen, or other NSAIDs unless your health care provider told you to do so.  Keep all follow-up visits as  told by your health care provider. This is important. This information is not intended to replace advice given to you by your health care provider. Make sure you discuss any questions you have with your health care provider. Document Revised: 06/07/2020 Document Reviewed: 06/07/2020 Elsevier Patient Education  2021 [employer].

## 2021-02-02 LAB — COMPLETE METABOLIC PANEL WITH GFR
AG Ratio: 1.8 (calc) (ref 1.0–2.5)
ALT: 47 U/L — ABNORMAL HIGH (ref 6–29)
AST: 33 U/L (ref 10–35)
Albumin: 4.2 g/dL (ref 3.6–5.1)
Alkaline phosphatase (APISO): 80 U/L (ref 37–153)
BUN: 23 mg/dL (ref 7–25)
CO2: 34 mmol/L — ABNORMAL HIGH (ref 20–32)
Calcium: 9.8 mg/dL (ref 8.6–10.4)
Chloride: 106 mmol/L (ref 98–110)
Creat: 0.99 mg/dL (ref 0.50–0.99)
GFR, Est African American: 70 mL/min/{1.73_m2} (ref >=60)
GFR, Est Non African American: 61 mL/min/{1.73_m2} (ref >=60)
Globulin: 2.3 g/dL (calc) (ref 1.9–3.7)
Glucose, Bld: 93 mg/dL (ref 65–99)
Potassium: 4.1 mmol/L (ref 3.5–5.3)
Sodium: 145 mmol/L (ref 135–146)
Total Bilirubin: 0.3 mg/dL (ref 0.2–1.2)
Total Protein: 6.5 g/dL (ref 6.1–8.1)

## 2021-02-02 LAB — CBC WITH DIFFERENTIAL/PLATELET
Absolute Monocytes: 432 cells/uL (ref 200–950)
Basophils Absolute: 32 cells/uL (ref 0–200)
Basophils Relative: 0.7 %
Eosinophils Absolute: 152 cells/uL (ref 15–500)
Eosinophils Relative: 3.3 %
HCT: 39.9 % (ref 35.0–45.0)
Hemoglobin: 13.9 g/dL (ref 11.7–15.5)
Lymphs Abs: 2438 cells/uL (ref 850–3900)
MCH: 34.8 pg — ABNORMAL HIGH (ref 27.0–33.0)
MCHC: 34.8 g/dL (ref 32.0–36.0)
MCV: 99.8 fL (ref 80.0–100.0)
MPV: 10.2 fL (ref 7.5–12.5)
Monocytes Relative: 9.4 %
Neutro Abs: 1546 cells/uL (ref 1500–7800)
Neutrophils Relative %: 33.6 %
Platelets: 157 10*3/uL (ref 140–400)
RBC: 4 10*6/uL (ref 3.80–5.10)
RDW: 12.3 % (ref 11.0–15.0)
Total Lymphocyte: 53 %
WBC: 4.6 10*3/uL (ref 3.8–10.8)

## 2021-02-02 LAB — LIPID PANEL
Cholesterol: 146 mg/dL (ref <200)
HDL: 51 mg/dL (ref >=50)
LDL Cholesterol (Calc): 75 mg/dL (calc)
Non-HDL Cholesterol (Calc): 95 mg/dL (calc) (ref <130)
Total CHOL/HDL Ratio: 2.9 (calc) (ref <5.0)
Triglycerides: 110 mg/dL (ref <150)

## 2021-02-02 LAB — MAGNESIUM: Magnesium: 2.2 mg/dL (ref 1.5–2.5)

## 2021-02-02 LAB — HEMOGLOBIN A1C
Hgb A1c MFr Bld: 5.6 % of total Hgb (ref <5.7)
Mean Plasma Glucose: 114 mg/dL
eAG (mmol/L): 6.3 mmol/L

## 2021-02-02 LAB — TSH: TSH: 0.86 mIU/L (ref 0.40–4.50)

## 2021-02-09 ENCOUNTER — Other Ambulatory Visit: Payer: Managed Care, Other (non HMO)

## 2021-02-10 ENCOUNTER — Other Ambulatory Visit: Payer: Managed Care, Other (non HMO)

## 2021-03-01 ENCOUNTER — Ambulatory Visit: Payer: Managed Care, Other (non HMO)

## 2021-03-10 ENCOUNTER — Ambulatory Visit: Payer: Managed Care, Other (non HMO)

## 2021-03-10 ENCOUNTER — Other Ambulatory Visit: Payer: Self-pay | Admitting: Internal Medicine

## 2021-03-13 IMAGING — US US ABDOMEN COMPLETE
1 series · 14 of 25 positions shown · non-contrast
Comparison: None.

CLINICAL DATA: Elevated liver function tests. Prior
cholecystectomy. Obesity. Pre diabetes.

EXAM:
ABDOMEN ULTRASOUND COMPLETE

[Series 1: us abdomen complete · 0.22mm/px · 14 of 60 slices shown]
[im 1/60]
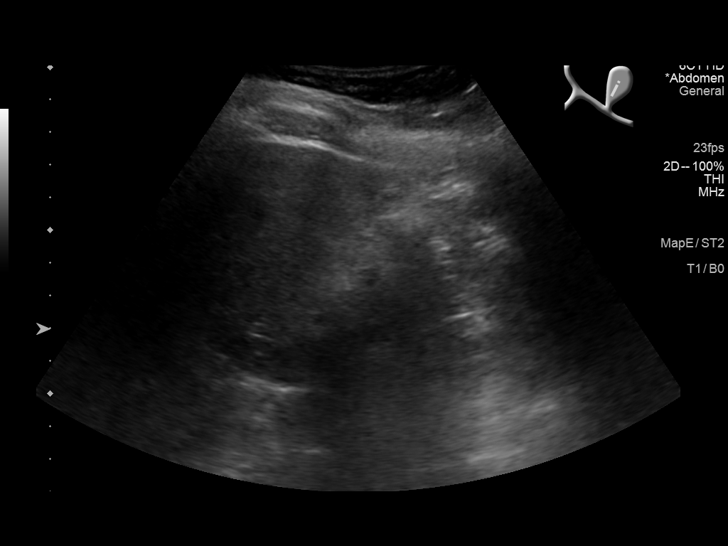
[im 5/60]
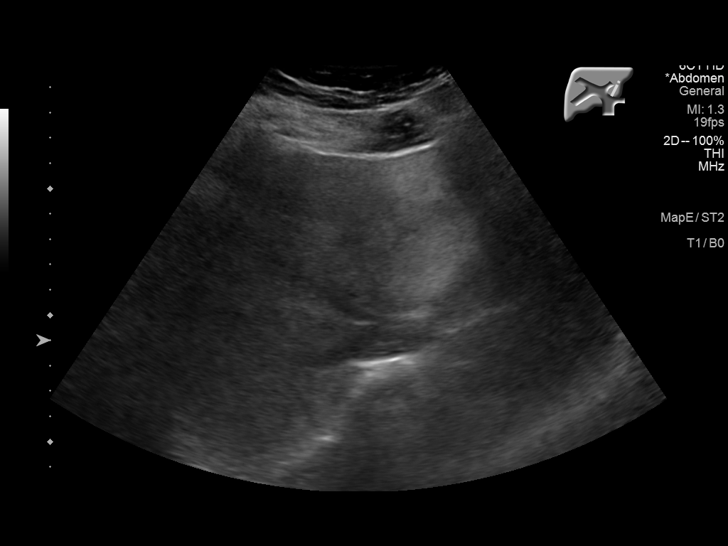
[im 10/60]
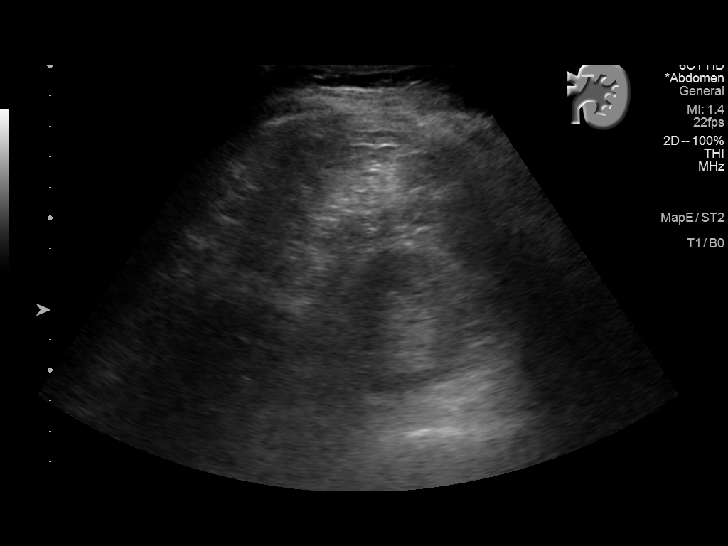
[im 15/60]
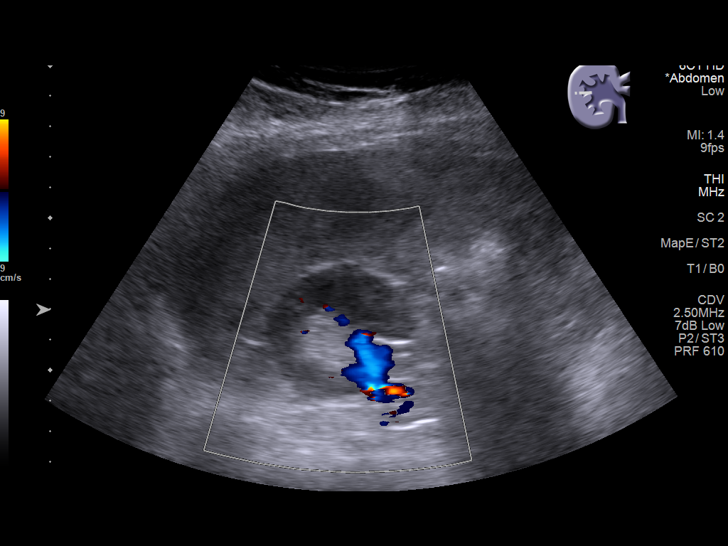
[im 20/60]
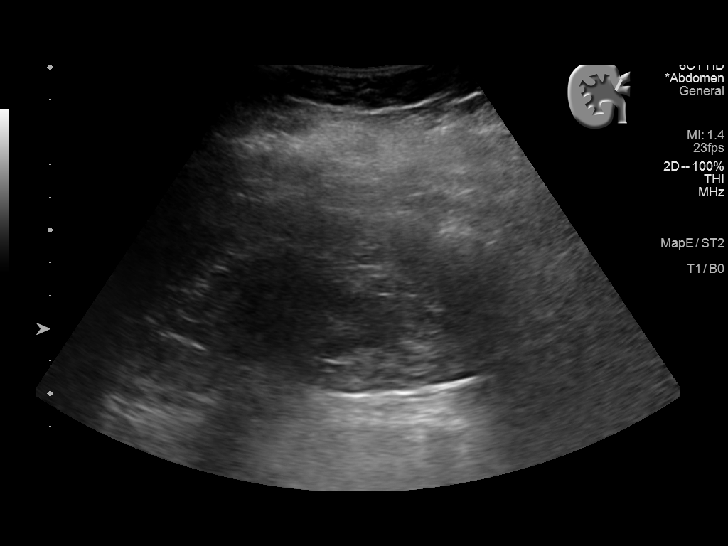
[im 23/60]
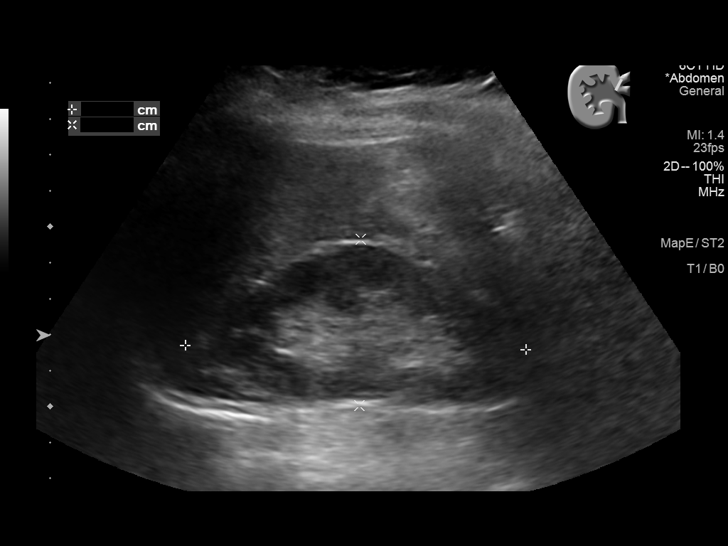
[im 28/60]
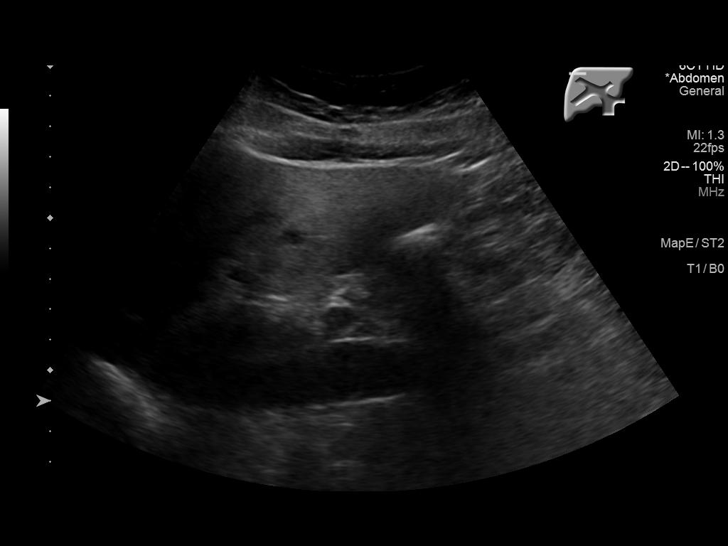
[im 32/60]
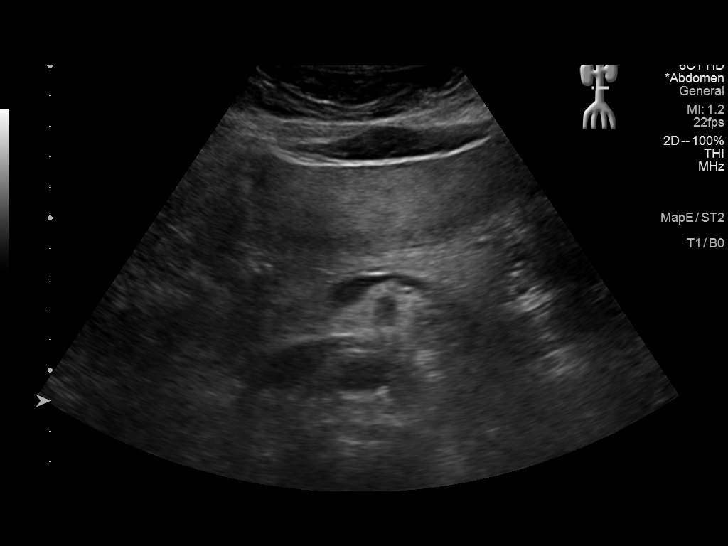
[im 37/60]
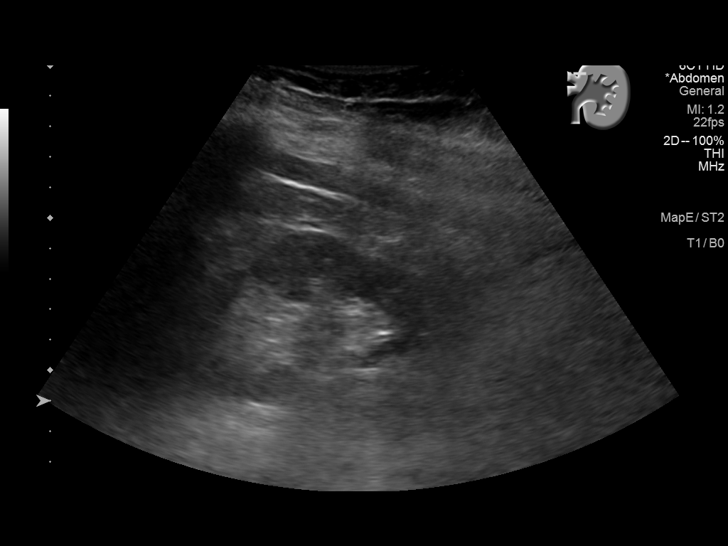
[im 40/60]
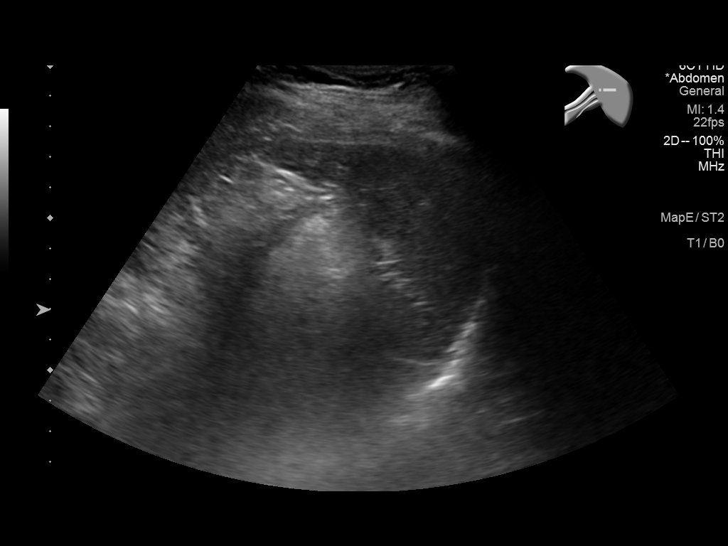
[im 45/60]
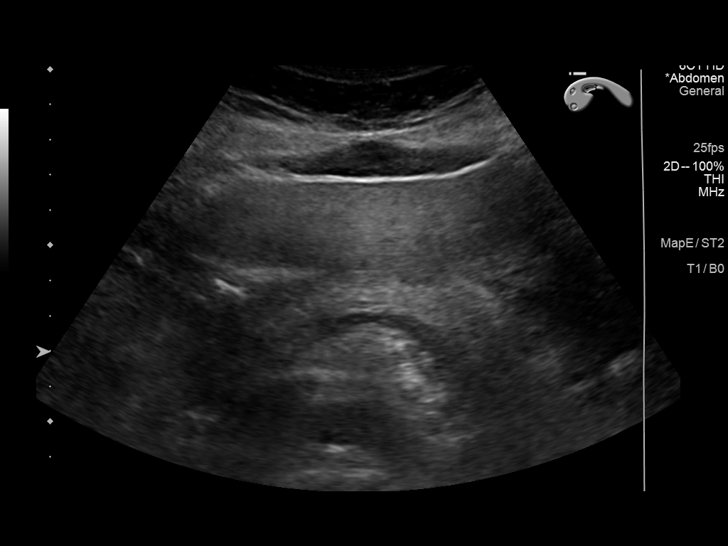
[im 50/60]
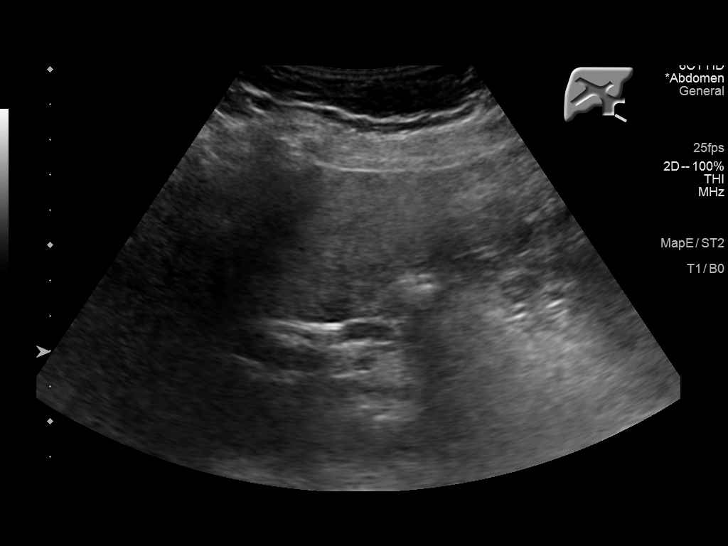
[im 55/60]
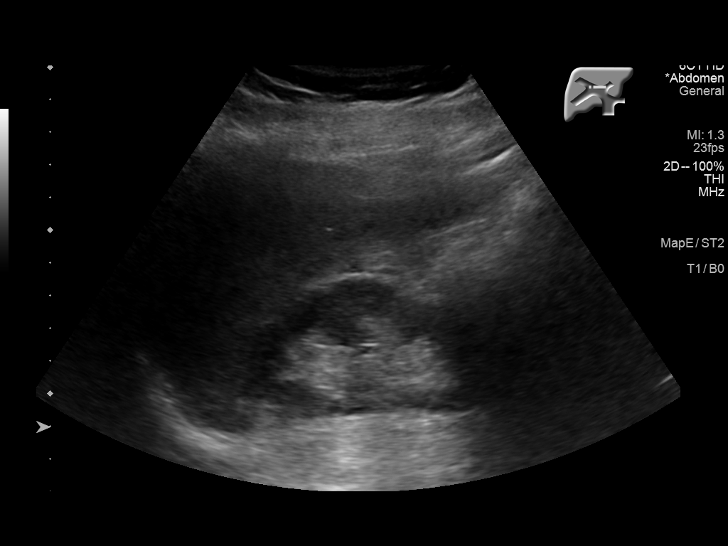
[im 60/60]
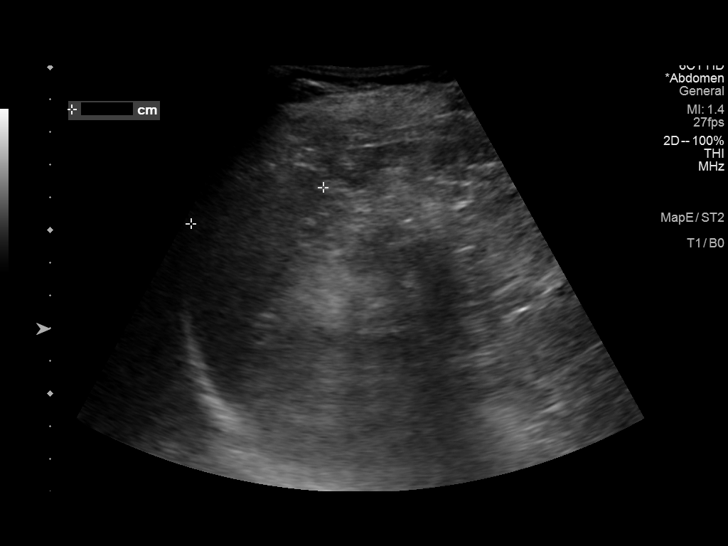

[14 of 25 positions shown; findings below may reference images not displayed]

FINDINGS: Gallbladder: Surgically absent.

Common bile duct: Diameter: 4 mm, within normal limits.

Liver: No focal lesion identified. Within normal limits in
parenchymal echogenicity. Portal vein is patent on color Doppler
imaging with normal direction of blood flow towards the liver.

IVC: No abnormality visualized.

Pancreas: Visualized portion unremarkable.

Spleen: Size and appearance within normal limits.

Right Kidney: Length: 9.5 cm. Echogenicity within normal limits. No
mass or hydronephrosis visualized.

Left Kidney: Length: 9.0 cm. Echogenicity within normal limits. No
mass or hydronephrosis visualized.

Abdominal aorta: No aneurysm visualized.

Other findings: None.
IMPRESSION: Prior cholecystectomy.  No evidence of biliary ductal dilatation.

Diffuse hepatic steatosis.

## 2021-03-14 ENCOUNTER — Other Ambulatory Visit: Payer: Self-pay | Admitting: Internal Medicine

## 2021-03-14 ENCOUNTER — Ambulatory Visit (INDEPENDENT_AMBULATORY_CARE_PROVIDER_SITE_OTHER): Payer: Managed Care, Other (non HMO)

## 2021-03-14 ENCOUNTER — Other Ambulatory Visit: Payer: Self-pay

## 2021-03-14 VITALS — BP 156/84 | HR 71 | Temp 96.8°F | Wt 157.0 lb

## 2021-03-14 DIAGNOSIS — Z79899 Other long term (current) drug therapy: Secondary | ICD-10-CM

## 2021-03-14 DIAGNOSIS — G8929 Other chronic pain: Secondary | ICD-10-CM

## 2021-03-14 MED ORDER — GABAPENTIN 300 MG PO CAPS: ORAL_CAPSULE | ORAL | 3 refills | Status: DC

## 2021-03-14 NOTE — Progress Notes (Signed)
Patient presents to the office for a nurse visit to have labs done and recheck Blood Pressure. Today's reading was 156/84. Patient states that she does have a cuff at home but does not check it. No changes to medications. Vitals taken and recorded.

## 2021-03-15 ENCOUNTER — Other Ambulatory Visit: Payer: Self-pay

## 2021-03-15 LAB — BASIC METABOLIC PANEL WITH GFR
BUN: 15 mg/dL (ref 7–25)
CO2: 30 mmol/L (ref 20–32)
Calcium: 9.3 mg/dL (ref 8.6–10.4)
Chloride: 108 mmol/L (ref 98–110)
Creat: 0.89 mg/dL (ref 0.50–0.99)
GFR, Est African American: 80 mL/min/{1.73_m2} (ref >=60)
GFR, Est Non African American: 69 mL/min/{1.73_m2} (ref >=60)
Glucose, Bld: 80 mg/dL (ref 65–99)
Potassium: 3.8 mmol/L (ref 3.5–5.3)
Sodium: 144 mmol/L (ref 135–146)

## 2021-03-15 MED ORDER — AMLODIPINE BESYLATE 5 MG PO TABS: 5.0000 mg | ORAL_TABLET | Freq: Every day | ORAL | 0 refills | Status: DC

## 2021-03-31 ENCOUNTER — Ambulatory Visit (INDEPENDENT_AMBULATORY_CARE_PROVIDER_SITE_OTHER): Payer: Managed Care, Other (non HMO)

## 2021-03-31 ENCOUNTER — Other Ambulatory Visit: Payer: Self-pay

## 2021-03-31 ENCOUNTER — Other Ambulatory Visit: Payer: Self-pay | Admitting: Adult Health

## 2021-03-31 DIAGNOSIS — I10 Essential (primary) hypertension: Secondary | ICD-10-CM

## 2021-03-31 MED ORDER — AMLODIPINE BESYLATE 5 MG PO TABS: 5.0000 mg | ORAL_TABLET | Freq: Every day | ORAL | 3 refills | Status: DC

## 2021-03-31 MED ORDER — METHOCARBAMOL 500 MG PO TABS: 500.0000 mg | ORAL_TABLET | Freq: Four times a day (QID) | ORAL | 1 refills | Status: DC | PRN

## 2021-03-31 NOTE — Progress Notes (Signed)
Patient presents to the office for a nurse visit to have her BP rechecked. Today's reading was 134/74. States that she has been taking all of her medications as they are prescribed.  While patient was here she states that for the last four days, her left leg (shin & calf area) are painful, very achy. No swelling, no redness and pain is intermittent. Pain is a 10. Also, requesting either a new prescription or to increase the dosage on the tizanidine. Back pain isn't any better.

## 2021-05-13 ENCOUNTER — Other Ambulatory Visit: Payer: Self-pay

## 2021-05-13 MED ORDER — ESCITALOPRAM OXALATE 20 MG PO TABS: ORAL_TABLET | ORAL | 3 refills | Status: DC

## 2021-05-17 ENCOUNTER — Other Ambulatory Visit: Payer: Self-pay

## 2021-05-17 MED ORDER — ESCITALOPRAM OXALATE 20 MG PO TABS: ORAL_TABLET | ORAL | 3 refills | Status: DC

## 2021-06-02 ENCOUNTER — Other Ambulatory Visit: Payer: Self-pay

## 2021-06-02 ENCOUNTER — Ambulatory Visit (INDEPENDENT_AMBULATORY_CARE_PROVIDER_SITE_OTHER): Payer: Managed Care, Other (non HMO) | Admitting: Adult Health

## 2021-06-02 ENCOUNTER — Encounter: Payer: Self-pay | Admitting: Adult Health

## 2021-06-02 VITALS — BP 134/80 | HR 73 | Temp 96.3°F | Wt 158.0 lb

## 2021-06-02 DIAGNOSIS — I10 Essential (primary) hypertension: Secondary | ICD-10-CM

## 2021-06-02 DIAGNOSIS — Z1211 Encounter for screening for malignant neoplasm of colon: Secondary | ICD-10-CM

## 2021-06-02 DIAGNOSIS — R935 Abnormal findings on diagnostic imaging of other abdominal regions, including retroperitoneum: Secondary | ICD-10-CM

## 2021-06-02 DIAGNOSIS — Z79899 Other long term (current) drug therapy: Secondary | ICD-10-CM

## 2021-06-02 DIAGNOSIS — Z1231 Encounter for screening mammogram for malignant neoplasm of breast: Secondary | ICD-10-CM

## 2021-06-02 DIAGNOSIS — E782 Mixed hyperlipidemia: Secondary | ICD-10-CM | POA: Diagnosis not present

## 2021-06-02 DIAGNOSIS — I700 Atherosclerosis of aorta: Secondary | ICD-10-CM

## 2021-06-02 DIAGNOSIS — M799 Soft tissue disorder, unspecified: Secondary | ICD-10-CM | POA: Diagnosis not present

## 2021-06-02 MED ORDER — OMEPRAZOLE 20 MG PO CPDR: DELAYED_RELEASE_CAPSULE | ORAL | 3 refills | Status: DC

## 2021-06-02 MED ORDER — OLMESARTAN MEDOXOMIL 40 MG PO TABS: 40.0000 mg | ORAL_TABLET | Freq: Every day | ORAL | 1 refills | Status: DC

## 2021-06-02 MED ORDER — ROSUVASTATIN CALCIUM 40 MG PO TABS: 40.0000 mg | ORAL_TABLET | Freq: Every day | ORAL | 3 refills | Status: DC

## 2021-06-02 NOTE — Progress Notes (Signed)
FOLLOW UP  Assessment and Plan:   Atherosclerosis of aorta (HCC) Control blood pressure, cholesterol, glucose, increase exercise.   Hypertension Continue current meds Monitor blood pressure at home; patient to call if consistently greater than 130/80 Continue DASH diet.   Reminder to go to the ER if any CP, SOB, nausea, dizziness, severe HA, changes vision/speech, left arm numbness and tingling and jaw pain.  Cholesterol Currently at goal; continue rosuvastatin 40 mg daily  Continue low cholesterol diet and exercise.  Check lipid panel.   Prediabetes Continue diet and exercise.  Perform daily foot/skin check, notify office of any concerning changes.  Check A1C q5071m; monitor weight, serum glucose  Overweight with comorbidities Commended on weight loss progress thus far Long discussion about weight loss, diet, and exercise Recommended diet heavy in fruits and veggies and low in animal meats, cheeses, and dairy products, appropriate calorie intake Discussed ideal weight for height and initial weight goal (<150lb) Will follow up in 3 months  Vitamin D Def At goal at last check continue supplementation to maintain goal of 60-100 Defer Vit D level to next CPE  Depression/anxiety Continue medications, lexapro and trazodone helpful  Lifestyle discussed: diet/exerise, sleep hygiene, stress management, hydration  GERD/ hx of esophageal stricture Denies stricture sx, but unmanaged reflux Discussed risks and advised restart PPI - med refilled Discussed diet, avoiding triggers and other lifestyle changes  Allergic rhinitis Hygiene discussed, restart zyrtec  Fatty liver Weight loss advised, avoid alcohol/tylenol, discussed low processed carbohydrate diet will monitor LFTs  Soft tissue lesion of pelvic region Abnormal CT scan, pelvis ? Left adnexal lesion per CT 05/2020, US was ordered but patient never followed through. Discussed and will coordinate this for patient due to poor  follow through. Will call to follow up in 1 month.   Encounter for screening mammogram for malignant neoplasm of breast -     MM Digital Screening; Future  Screening for colon cancer -     Ambulatory referral to Gastroenterology   Continue diet and meds as discussed. Further disposition pending results of labs. Discussed med's effects and SE's.   Over 30 minutes of exam, counseling, chart review, and critical decision making was performed.   Future Appointments  Date Time Provider Department Center  09/12/2021  2:00 PM Judd Gaudierorbett, Arlenis Blaydes, NP [facility] None    ----------------------------------------------------------------------------------------------------------------------  HPI 65 y.o. female  presents for 3 month follow up on hypertension, cholesterol, prediabetes, weight, GERD, anxiety and vitamin D deficiency.   She was evaluated at ED 09/01/2020 after slipped, fell at work and hit head; CT head showed no concerning acute findings. CT cervical spine showed spondylosis, mild/mod stenosis at C3-C6 . Multilevel bony neural foraminal narrowing. She reports has improved, not achy pain, taking tizanidine with benefit. Also taking gabapentin 600 mg at night but doesn't perceive benefit. Taking tylenol in AM which helps more.    On review note CT 05/31/2020 from ED showed indeterminate 1.6 cm attenuation lesion in the region of the left adnexa that was recommended further evaluation with nonemergent pelvic ultrasound is recommended in the near future to better characterize this finding. US was ordered 02/01/2021 but patient never scheduled. She does note atypical bloating.   She is on lexapro for recurrent depression and feels this is helpful, trazodone was added last visit and feels this is helping.   Hx of GERD/stricture/dysphagia s/p dilation, denies dysphagia, admits hasn't been taking omprazole, has persistent cough, intermittent reflux, increased bloating worse in the last month.   Also  notes rhinitis, post nasal drip, typically has allergies in spring, taking Claritin.   She had abd/pelvis CT in 05/2020 for abdominal pain; incidentally showed 1.6 cm intermediate attenuation lesions in region of L adnexa recommended for pelvic ultrasound which was ordered 06/2020, patient hadn't scheduled citing bill from Ct but today is agreeable to proceed with this. Will have referral coordinator schedule -   BMI is Body mass index is 27.99 kg/m., she is not currently working on diet bhad poor appetite though covid 19, still eating less. Goal weight is 140 lb.  Wt Readings from Last 3 Encounters:  06/02/21 158 lb (71.7 kg)  03/14/21 157 lb (71.2 kg)  02/01/21 151 lb (68.5 kg)   She admits hasn't been checking BP at home, does have a cuff, today their BP is BP: 134/80  proteinuria/microalbuminuria with prediabetes.   She does not workout. She denies chest pain, shortness of breath, dizziness.  Aortic atherosclerosis per CT 05/2020.    She is on cholesterol medication Rosuvastatin 40 mg daily and denies myalgias. Her cholesterol is at goal. The cholesterol last visit was:   Lab Results  Component Value Date   CHOL 146 02/01/2021   HDL 51 02/01/2021   LDLCALC 75 02/01/2021   TRIG 110 02/01/2021   CHOLHDL 2.9 02/01/2021    She has been working on diet and exercise for hx of prediabetes, and denies increased appetite, nausea, paresthesia of the feet, polydipsia, polyuria and visual disturbances. Last A1C in the office was:  Lab Results  Component Value Date   HGBA1C 5.6 02/01/2021   Patient is on Vitamin D supplement.   Lab Results  Component Value Date   VD25OH 86 09/09/2020     Lab Results  Component Value Date   GFRAA 80 03/14/2021   She has new mild LFT elevations last year; hepatitis panel was negative 08/20/2019, [place] 12/2019 showed diffuse hepatic steatosis; normal iron 02/2020.  Lab Results  Component Value Date   ALT 47 (H) 02/01/2021   AST 33 02/01/2021   ALKPHOS 81  06/28/2017   BILITOT 0.3 02/01/2021     Current Medications:  Current Outpatient Medications on File Prior to Visit  Medication Sig   albuterol (VENTOLIN HFA) 108 (90 Base) MCG/ACT inhaler Inhale 2 puffs into the lungs every 4 (four) hours as needed for wheezing or shortness of breath.   amLODipine (NORVASC) 5 MG tablet Take 1 tablet (5 mg total) by mouth daily.   Biotin 300 MCG TABS Take 1 tablet by mouth at bedtime.    butalbital-acetaminophen-caffeine (FIORICET) 50-325-40 MG tablet Take 1 tablet by mouth every 6 (six) hours as needed for headache.   cetirizine (ZYRTEC ALLERGY) 10 MG tablet Take 1 tablet (10 mg total) by mouth daily.   CHOLECALCIFEROL PO Take 10,000 Units by mouth daily.   escitalopram (LEXAPRO) 20 MG tablet TAKE ONE TABLET BY MOUTH DAILY FOR MOOD   fluticasone (FLONASE) 50 MCG/ACT nasal spray SPRAY TWO SPRAYS IN EACH NOSTRIL ONCE DAILY   latanoprost (XALATAN) 0.005 % ophthalmic solution Place 1 drop into both eyes at bedtime.   Magnesium 250 MG TABS Take 1 tablet by mouth 2 (two) times daily.    methocarbamol (ROBAXIN) 500 MG tablet Take 1 tablet (500 mg total) by mouth every 6 (six) hours as needed for muscle spasms.   metoprolol succinate (TOPROL-XL) 25 MG 24 hr tablet Take 25 mg by mouth daily.   olmesartan (BENICAR) 40 MG tablet Take 1 tablet (40 mg total) by mouth daily.  omeprazole (PRILOSEC) 20 MG capsule Take     1 capsule     at Bedtime      for Acid Indigestion & Reflux   rosuvastatin (CRESTOR) 40 MG tablet Take 1 tablet (40 mg total) by mouth daily. for cholesterol   traZODone (DESYREL) 50 MG tablet TAKE ONE-HALF TO ONE TABLET BY MOUTH ONE HOUR PRIOR TO BEDTIME FOR SLEEP   aspirin 81 MG tablet Take 81 mg by mouth daily. (Patient not taking: Reported on 06/02/2021)   azelastine (ASTELIN) 0.1 % nasal spray PLACE 2 SPRAYS INTO BOTH NOSTRILS 2 (TWO) TIMES DAILY. USE IN EACH NOSTRIL AS DIRECTED (Patient not taking: Reported on 06/02/2021)   No current  facility-administered medications on file prior to visit.     Allergies:  Allergies  Allergen Reactions   Ppd [Tuberculin Purified Protein Derivative]     + PPD with NEG CXR 1/ 2014   Clinoril [Sulindac] Rash     Medical History:  Past Medical History:  Diagnosis Date   Allergy    Anxiety    Depression    Esophageal stricture    GERD (gastroesophageal reflux disease)    History of COVID-19 02/23/2020   Hyperlipemia    Hypertension    Unspecified vitamin D deficiency    Family history- Reviewed and unchanged Social history- Reviewed and unchanged   Review of Systems:  Review of Systems  Constitutional:  Negative for malaise/fatigue and weight loss.  HENT:  Negative for congestion, hearing loss, sore throat and tinnitus.   Eyes:  Negative for blurred vision and double vision.  Respiratory:  Positive for cough (dry cough day and night). Negative for sputum production, shortness of breath and wheezing.   Cardiovascular:  Negative for chest pain, palpitations, orthopnea, claudication and leg swelling.  Gastrointestinal:  Positive for heartburn (intermittent). Negative for abdominal pain, blood in stool, constipation, diarrhea, melena, nausea and vomiting.       Bloating  Genitourinary: Negative.   Musculoskeletal:  Positive for back pain. Negative for joint pain and myalgias.  Skin:  Negative for rash.  Neurological:  Negative for dizziness, tingling, sensory change, weakness and headaches.  Endo/Heme/Allergies:  Positive for environmental allergies. Negative for polydipsia.  Psychiatric/Behavioral:  Negative for depression, substance abuse and suicidal ideas. The patient is not nervous/anxious and does not have insomnia.   All other systems reviewed and are negative.   Physical Exam: BP 134/80   Pulse 73   Temp (!) 96.3 F (35.7 C)   Wt 158 lb (71.7 kg)   SpO2 98%   BMI 27.99 kg/m  Wt Readings from Last 3 Encounters:  06/02/21 158 lb (71.7 kg)  03/14/21 157 lb  (71.2 kg)  02/01/21 151 lb (68.5 kg)   General Appearance: Well nourished, in no apparent distress. Eyes: PERRLA, EOMs, conjunctiva no swelling or erythema Sinuses: No Frontal/maxillary tenderness ENT/Mouth: Ext aud canals clear, TMs without erythema, bulging. No erythema, swelling, or exudate on post pharynx.  Tonsils not swollen or erythematous. Hearing normal.  Neck: Supple, thyroid normal.  Respiratory: Respiratory effort normal, BS equal bilaterally without rales, rhonchi, wheezing or stridor.  Cardio: RRR with no MRGs. Brisk peripheral pulses without edema.  Abdomen: Soft, + BS.  Non tender, no guarding, rebound, hernias, masses. Lymphatics: Non tender without lymphadenopathy.  Musculoskeletal: Full ROM, 5/5 strength, Normal gait.  Skin: Warm, dry without rashes, lesions, ecchymosis.  Neuro: Cranial nerves intact. No cerebellar symptoms.  Psych: Awake and oriented X 3, normal affect, Insight and Judgment appropriate.  Dan Maker, NP 11:16 AM [facility]

## 2021-06-02 NOTE — Patient Instructions (Signed)
To do:   Get pelvic ultrasound done - we will schedule for you, so expect a phone call. I you miss this, please call back.   Schedule mammogram ASAP -   [facility]  7 a.m.-6:30 p.m., Monday 7 a.m.-5 p.m., Tuesday-Friday Schedule an appointment by calling [phone].    Start taking omeprazole daily 1 hour prior to bedtime for 2 weeks and see if this helps coughing.   If still weezing, try albuterol inhaler prior to bedtime  Treat nasal drip with allergy pill and nasal sprays    Please stop looking at phone/TV for the last hour prior to bedtime.    Insomnia Insomnia is frequent trouble falling and/or staying asleep. Insomnia can be a long term problem or a short term problem. Both are common. Insomnia can be a short term problem when the wakefulness is related to a certain stress or worry. Long term insomnia is often related to ongoing stress during waking hours and/or poor sleeping habits. Overtime, sleep deprivation itself can make the problem worse. Every little thing feels more severe because you are overtired and your ability to cope is decreased. CAUSES  Stress, anxiety, and depression. Poor sleeping habits. Distractions such as TV in the bedroom. Naps close to bedtime. Engaging in emotionally charged conversations before bed. Technical reading before sleep. Alcohol and other sedatives. They may make the problem worse. They can hurt normal sleep patterns and normal dream activity. Stimulants such as caffeine for several hours prior to bedtime. Pain syndromes and shortness of breath can cause insomnia. Exercise late at night. Changing time zones may cause sleeping problems (jet lag). It is sometimes helpful to have someone observe your sleeping patterns. They should look for periods of not breathing during the night (sleep apnea). They should also look to see how long those periods last. If you live alone or observers are uncertain, you can  also be observed at a sleep clinic where your sleep patterns will be professionally monitored. Sleep apnea requires a checkup and treatment. Give your caregivers your medical history. Give your caregivers observations your family has made about your sleep.  SYMPTOMS  Not feeling rested in the morning. Anxiety and restlessness at bedtime. Difficulty falling and staying asleep. TREATMENT  Your caregiver may prescribe treatment for an underlying medical disorders. Your caregiver can give advice or help if you are using alcohol or other drugs for self-medication. Treatment of underlying problems will usually eliminate insomnia problems. Medications can be prescribed for short time use. They are generally not recommended for lengthy use. Over-the-counter sleep medicines are not recommended for lengthy use. They can be habit forming. You can promote easier sleeping by making lifestyle changes such as: Using relaxation techniques that help with breathing and reduce muscle tension. Exercising earlier in the day. Changing your diet and the time of your last meal. No night time snacks. Establish a regular time to go to bed. Counseling can help with stressful problems and worry. Soothing music and white noise may be helpful if there are background noises you cannot remove. Stop tedious detailed work at least one hour before bedtime. HOME CARE INSTRUCTIONS  Keep a diary. Inform your caregiver about your progress. This includes any medication side effects. See your caregiver regularly. Take note of: Times when you are asleep. Times when you are awake during the night. The quality of your sleep. How you feel the next day. This information will help your caregiver care for you. Get out of  bed if you are still awake after 15 minutes. Read or do some quiet activity. Keep the lights down. Wait until you feel sleepy and go back to bed. Keep regular sleeping and waking hours. Avoid naps. Exercise  regularly. Avoid distractions at bedtime. Distractions include watching television or engaging in any intense or detailed activity like attempting to balance the household checkbook. Develop a bedtime ritual. Keep a familiar routine of bathing, brushing your teeth, climbing into bed at the same time each night, listening to soothing music. Routines increase the success of falling to sleep faster. Use relaxation techniques. This can be using breathing and muscle tension release routines. It can also include visualizing peaceful scenes. You can also help control troubling or intruding thoughts by keeping your mind occupied with boring or repetitive thoughts like the old concept of counting sheep. You can make it more creative like imagining planting one beautiful flower after another in your backyard garden. During your day, work to eliminate stress. When this is not possible use some of the previous suggestions to help reduce the anxiety that accompanies stressful situations. MAKE SURE YOU:  Understand these instructions. Will watch your condition. Will get help right away if you are not doing well or get worse. Document Released: 11/24/2000 Document Revised: 02/19/2012 Document Reviewed: 12/25/2007 [facility] Patient Information 2015 [place], [place]. This information is not intended to replace advice given to you by your health care provider. Make sure you discuss any questions you have with your health care provider.

## 2021-06-03 LAB — COMPLETE METABOLIC PANEL WITH GFR
AG Ratio: 1.9 (calc) (ref 1.0–2.5)
ALT: 53 U/L — ABNORMAL HIGH (ref 6–29)
AST: 43 U/L — ABNORMAL HIGH (ref 10–35)
Albumin: 4.3 g/dL (ref 3.6–5.1)
Alkaline phosphatase (APISO): 96 U/L (ref 37–153)
BUN: 13 mg/dL (ref 7–25)
CO2: 30 mmol/L (ref 20–32)
Calcium: 9.8 mg/dL (ref 8.6–10.4)
Chloride: 107 mmol/L (ref 98–110)
Creat: 0.9 mg/dL (ref 0.50–0.99)
GFR, Est African American: 79 mL/min/{1.73_m2} (ref >=60)
GFR, Est Non African American: 68 mL/min/{1.73_m2} (ref >=60)
Globulin: 2.3 g/dL (calc) (ref 1.9–3.7)
Glucose, Bld: 102 mg/dL — ABNORMAL HIGH (ref 65–99)
Potassium: 4 mmol/L (ref 3.5–5.3)
Sodium: 143 mmol/L (ref 135–146)
Total Bilirubin: 0.3 mg/dL (ref 0.2–1.2)
Total Protein: 6.6 g/dL (ref 6.1–8.1)

## 2021-06-03 LAB — CBC WITH DIFFERENTIAL/PLATELET
Absolute Monocytes: 320 cells/uL (ref 200–950)
Basophils Absolute: 11 cells/uL (ref 0–200)
Basophils Relative: 0.3 %
Eosinophils Absolute: 101 cells/uL (ref 15–500)
Eosinophils Relative: 2.8 %
HCT: 42.5 % (ref 35.0–45.0)
Hemoglobin: 15.2 g/dL (ref 11.7–15.5)
Lymphs Abs: 1649 cells/uL (ref 850–3900)
MCH: 34.9 pg — ABNORMAL HIGH (ref 27.0–33.0)
MCHC: 35.8 g/dL (ref 32.0–36.0)
MCV: 97.5 fL (ref 80.0–100.0)
MPV: 10.2 fL (ref 7.5–12.5)
Monocytes Relative: 8.9 %
Neutro Abs: 1519 cells/uL (ref 1500–7800)
Neutrophils Relative %: 42.2 %
Platelets: 191 10*3/uL (ref 140–400)
RBC: 4.36 10*6/uL (ref 3.80–5.10)
RDW: 11.9 % (ref 11.0–15.0)
Total Lymphocyte: 45.8 %
WBC: 3.6 10*3/uL — ABNORMAL LOW (ref 3.8–10.8)

## 2021-06-03 LAB — LIPID PANEL
Cholesterol: 159 mg/dL (ref <200)
HDL: 56 mg/dL (ref >=50)
LDL Cholesterol (Calc): 82 mg/dL (calc)
Non-HDL Cholesterol (Calc): 103 mg/dL (calc) (ref <130)
Total CHOL/HDL Ratio: 2.8 (calc) (ref <5.0)
Triglycerides: 109 mg/dL (ref <150)

## 2021-06-03 LAB — TSH: TSH: 0.94 mIU/L (ref 0.40–4.50)

## 2021-06-03 LAB — MAGNESIUM: Magnesium: 2.2 mg/dL (ref 1.5–2.5)

## 2021-06-22 ENCOUNTER — Ambulatory Visit
Admission: RE | Admit: 2021-06-22 | Discharge: 2021-06-22 | Disposition: A | Discharge status: Home / Self Care | Payer: Managed Care, Other (non HMO) | Source: Ambulatory Visit | Attending: Adult Health | Admitting: Adult Health

## 2021-06-22 DIAGNOSIS — M799 Soft tissue disorder, unspecified: Secondary | ICD-10-CM

## 2021-06-22 DIAGNOSIS — R935 Abnormal findings on diagnostic imaging of other abdominal regions, including retroperitoneum: Secondary | ICD-10-CM

## 2021-07-08 ENCOUNTER — Telehealth: Payer: Self-pay

## 2021-07-08 NOTE — Telephone Encounter (Signed)
-----   Message from Judd Gaudier, NP sent at 06/02/2021 11:38 AM EDT ----- Regarding: Follow up Patient with very poor follow through   Please call and review status on things we discussed at her last OV:   To do:   Get pelvic ultrasound done - we will schedule for you, so expect a phone call. I you miss this, please call back.   Schedule mammogram ASAP -   [facility]  7 a.m.-6:30 p.m., Monday 7 a.m.-5 p.m., Tuesday-Friday Schedule an appointment by calling [phone].    Start taking omeprazole daily 1 hour prior to bedtime for 2 weeks and see if this helps coughing.   If still weezing, try albuterol inhaler prior to bedtime  Treat nasal drip with allergy pill and nasal sprays    Please stop looking at phone/TV for the last hour prior to bedtime.

## 2021-07-08 NOTE — Telephone Encounter (Signed)
Left message on voice mail  to call back

## 2021-07-17 ENCOUNTER — Other Ambulatory Visit: Payer: Self-pay | Admitting: Adult Health

## 2021-08-26 ENCOUNTER — Encounter: Payer: Managed Care, Other (non HMO) | Admitting: Adult Health

## 2021-09-12 ENCOUNTER — Encounter: Payer: Managed Care, Other (non HMO) | Admitting: Adult Health

## 2021-09-21 NOTE — Progress Notes (Signed)
Complete Physical  Assessment and Plan: Kaitlyn [name] was seen today for annual exam.  Diagnoses and all orders for this visit:  Encounter for routine adult health examination without abnormal findings  Due Annually  Will call to schedule mammogram  Essential hypertension -     CBC with Differential/Platelet -     Urinalysis, Routine w reflex microscopic -     Microalbumin / creatinine urine ratio -     metoprolol succinate (TOPROL-XL) 25 MG 24 hr tablet; Take 1 tablet (25 mg total) by mouth daily. Adding back to regimen. She stopped but was not taken off by provider and BP is elevated.  Discussed importance of taking all 3 meds. - - continue medications, DASH diet, exercise and monitor at home. Call if greater than 130/80.   - Go to the ER if any chest pain, shortness of breath, nausea, dizziness, severe HA, changes vision/speech   Hyperlipidemia, mixed -     COMPLETE METABOLIC PANEL WITH GFR -     Lipid panel -     TSH  Continue medication, low saturated fat diet and increase exercise  Mild episode of recurrent major depressive disorder (HCC)  Monitor symptoms  Continue Lexapro and behavior modifications  Gastroesophageal reflux disease, unspecified whether esophagitis present -     Magnesium Continue Prilosec and behavior modifications  Hepatic steatosis  CMP  Continue diet and exercise  Vitamin D deficiency -     VITAMIN D 25 Hydroxy (Vit-D Deficiency, Fractures) Continue Vit D supplementation  Medication management -     CBC with Differential/Platelet -     COMPLETE METABOLIC PANEL WITH GFR -     Lipid panel -     TSH -     Hemoglobin A1c -     VITAMIN D 25 Hydroxy (Vit-D Deficiency, Fractures) -     Magnesium  Allergic Rhinitis Pt to switch to Allegra, Zyrtec not helping as much Use Albuterol inhaler as needed for wheezing  Abnormal glucose -     Hemoglobin A1c  Screening for ischemic heart disease -     EKG 12-Lead  Colon cancer screening -     Ambulatory  referral to Gastroenterology   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 Monitor symptoms and if pain worsens call for Ultrasound   Future Appointments  Date Time Provider Department Center  09/25/2022  2:00 PM Revonda Humphrey, NP [facility] None     HPI  65 y.o. female  presents for a complete physical. She has Major depression, recurrent (HCC); Esophageal reflux; Vitamin D deficiency; Hypertension; Hyperlipemia; Medication management; Overweight (BMI 25.0-29.9); Other abnormal glucose (prediabetes); Urge incontinence; Hepatic steatosis; Aortic atherosclerosis (HCC); Soft tissue lesion of pelvic region; Abnormal CT scan, pelvis; Insomnia; and Lumbar pain on their problem list.   She is divorced, works at [employer], no children.   She does have a lot of post nasal drip. Has been using Zyrtec but not having relief of symptoms as did previously  She was evaluated at ED 09/01/2020 after slipped, fell at work and hit head; CT head showed no concerning acute findings. CT cervical spine showed spondylosis, mild/mod stenosis at C3-C6 . Multilevel bony neural foraminal narrowing. She reports achy pain, tmethocarbomol is helping   She is on lexapro for recurrent depression and feels this is helpful.   Hx of GERD/stricture/dysphagia, taking omeprazole daily without breakthrough.   She had  abd/pelvis CT in 05/2020 for abdominal pain; incidentally showed aortic atherosclerosis and 1.6 cm intermediate attenuation lesions in region of L adnexa recommended for pelvic ultrasound which hasn't been completed, states trying to pay off previous bill. Does not wish to pursue further testing. Denies constipation, diarrhea.  Does have persistent pain in right inguinal area.   BMI is Body mass index is 27.85 kg/m., she has not been working on diet and exercise.  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   She drinks mainly water, drinks 2 bottles of 20 oz 1 cup of coffee daily  She had [place] 12/2019 which showed diffuse hepatosteatosis   Wt Readings from Last 3 Encounters:  09/22/21 157 lb 3.2 oz (71.3 kg)  06/02/21 158 lb (71.7 kg)  03/14/21 157 lb (71.2 kg)   She has not been checking BPs at home, today their BP is BP: (!) 162/88. Taking Olmesartan 40 mg, and Amlodipine. Metoprolol 25 mg was on med list but pt hasnt been taking, last refill 08/2020. Bp elevated today  She does not workout. She denies chest pain, shortness of breath, dizziness.   She is on cholesterol medication (rosuvastatin 40 mg daily) and denies myalgias. Her cholesterol is at goal. The cholesterol last visit was:  Lab Results  Component Value Date   CHOL 159 06/02/2021   HDL 56 06/02/2021   LDLCALC 82 06/02/2021   TRIG 109 06/02/2021   CHOLHDL 2.8 06/02/2021  . She has not been working on diet and exercise for prediabetes, and denies foot ulcerations, hyperglycemia, hypoglycemia , increased appetite, nausea, paresthesia of the feet, polydipsia, polyuria, visual disturbances, vomiting and weight loss. Last A1C in the office was:  Lab Results  Component Value Date   HGBA1C 5.6 02/01/2021   She has CKD IIIa associated with htn; on ARB; last GFR:  Lab Results  Component Value Date   GFRAA 79 06/02/2021   Patient is on Vitamin D supplement, taking 85277 IU daily   Lab Results  Component Value Date   VD25OH 86 09/09/2020      Current Medications:  Current Outpatient Medications on File Prior to Visit  Medication Sig Dispense Refill   albuterol (VENTOLIN HFA) 108 (90 Base) MCG/ACT inhaler Inhale 2 puffs into the lungs every 4 (four) hours as needed for wheezing or shortness of breath. 1 Inhaler 0   amLODipine (NORVASC) 5 MG tablet Take 1 tablet (5 mg total) by mouth daily. 90 tablet 3   Biotin 300 MCG TABS Take 1 tablet by mouth at bedtime.      cetirizine (ZYRTEC ALLERGY) 10 MG tablet Take 1 tablet (10 mg  total) by mouth daily.     CHOLECALCIFEROL PO Take 10,000 Units by mouth daily.     escitalopram (LEXAPRO) 20 MG tablet TAKE ONE TABLET BY MOUTH DAILY FOR MOOD 90 tablet 3   fluticasone (FLONASE) 50 MCG/ACT nasal spray SPRAY TWO SPRAYS IN EACH NOSTRIL ONCE DAILY 16 g 2   latanoprost (XALATAN) 0.005 % ophthalmic solution Place 1 drop into both eyes at bedtime.     Magnesium 250 MG TABS Take 1 tablet by mouth 2 (two) times daily.      methocarbamol (ROBAXIN) 500 MG tablet Take 1 tablet (500 mg total) by mouth every 6 (six) hours as needed for muscle spasms. 120 tablet 1   olmesartan (BENICAR) 40 MG tablet Take 1 tablet (40 mg total) by mouth daily. 90 tablet 1   omeprazole (PRILOSEC) 20 MG  capsule Take 1 capsule at Bedtime for Acid Indigestion & Reflux 90 capsule 3   rosuvastatin (CRESTOR) 40 MG tablet Take 1 tablet (40 mg total) by mouth daily. for cholesterol 90 tablet 3   traZODone (DESYREL) 50 MG tablet Take  1/2 to 1 tablet  1 hour  before Bedtime  as needed for Sleep / Patient knows to take by mouth 90 tablet 1   aspirin 81 MG tablet Take 81 mg by mouth daily. (Patient not taking: Reported on 06/02/2021)     azelastine (ASTELIN) 0.1 % nasal spray PLACE 2 SPRAYS INTO BOTH NOSTRILS 2 (TWO) TIMES DAILY. USE IN EACH NOSTRIL AS DIRECTED (Patient not taking: Reported on 06/02/2021) 30 mL 1   metoprolol succinate (TOPROL-XL) 25 MG 24 hr tablet Take 25 mg by mouth daily.     No current facility-administered medications on file prior to visit.    Health Maintenance:   Immunization History  Administered Date(s) Administered   Influenza Inj Mdck Quad With Preservative 09/09/2020   Influenza Split 08/30/2015   Influenza,inj,Quad PF,6+ Mos 08/09/2019   Influenza-Unspecified 10/09/2017   PFIZER(Purple Top)SARS-COV-2 Vaccination 03/12/2020, 04/02/2020   Pneumococcal-Unspecified 12/11/2008   Td 12/11/2005   Tdap 12/30/2015   Zoster Recombinat (Shingrix) 04/26/2017, 07/25/2017   Tetanus:  2017 Pneumovax: 2010, due age 46 Flu vaccine: 09/09/20 Shingrix: 2018 Covid 19: 2/2, pfizer   LMP: postmenopausal Pap: 2018 neg, HPV neg, due 2021-2023 - defer today, insufficient time  MGM:11/2019 DEXA: get at age 59  Colonoscopy: 02/2010, Dr Arlyce Dice  EGD: 2015  Last Dental Exam: 2021, goes q14m Last Eye Exam: 2020, goes annually, glasses  Patient Care Team: Lucky Cowboy, MD as PCP - General (Internal Medicine) Louis Meckel, MD (Inactive) as Consulting Physician (Gastroenterology) Shea Evans, MD as Consulting Physician (Obstetrics and Gynecology) Nadara Mustard, MD as Consulting Physician (Orthopedic Surgery)  Medical History:  Past Medical History:  Diagnosis Date   Allergy    Anxiety    Depression    Esophageal stricture    GERD (gastroesophageal reflux disease)    History of COVID-19 02/23/2020   Hyperlipemia    Hypertension    Unspecified vitamin D deficiency    Allergies Allergies  Allergen Reactions   Ppd [Tuberculin Purified Protein Derivative]     + PPD with NEG CXR 1/ 2014   Clinoril [Sulindac] Rash    SURGICAL HISTORY She  has a past surgical history that includes Cholecystectomy; Esophageal dilation (2009); and LEEP (2004). FAMILY HISTORY Her family history includes Breast cancer (age of onset: 64) in her sister; Diabetes in her sister; Glaucoma in her mother and sister; Hyperlipidemia in her mother; Hypertension in her brother, mother, and sister; Stroke (age of onset: 72) in her mother. SOCIAL HISTORY She  reports that she quit smoking about 19 years ago. Her smoking use included cigarettes. She has a 1.50 pack-year smoking history. She has never used smokeless tobacco. She reports that she does not drink alcohol and does not use drugs.  Review of Systems: Review of Systems  Constitutional:  Negative for chills, diaphoresis, fever and malaise/fatigue.  HENT:  Negative for congestion, ear pain, hearing loss, sinus pain, sore throat and  tinnitus.   Eyes: Negative.  Negative for blurred vision and double vision.  Respiratory:  Negative for cough, hemoptysis, sputum production, shortness of breath and wheezing.   Cardiovascular:  Negative for chest pain, palpitations and leg swelling.  Gastrointestinal:  Negative for abdominal pain, blood in stool, constipation, diarrhea, heartburn, melena, nausea and  vomiting.  Genitourinary: Negative.  Negative for dysuria and urgency.       Urge incontinence, wears 1-2 pads per day  Musculoskeletal:  Positive for back pain, falls and neck pain (denies midline; trap/muscle pain since falling). Negative for joint pain and myalgias.  Skin: Negative.  Negative for rash.  Neurological:  Negative for dizziness, tingling, tremors, sensory change, loss of consciousness, weakness and headaches.  Endo/Heme/Allergies:  Does not bruise/bleed easily.  Psychiatric/Behavioral:  Negative for depression, substance abuse and suicidal ideas. The patient is not nervous/anxious and does not have insomnia.    Physical Exam: Estimated body mass index is 27.85 kg/m as calculated from the following:   Height as of this encounter: 5\' 3"  (1.6 m).   Weight as of this encounter: 157 lb 3.2 oz (71.3 kg). BP (!) 162/88   Pulse 88   Temp 97.9 F (36.6 C)   Ht 5\' 3"  (1.6 m)   Wt 157 lb 3.2 oz (71.3 kg)   SpO2 97%   BMI 27.85 kg/m   General Appearance: Well nourished well developed, in no apparent distress.  Eyes: PERRLA, EOMs, conjunctiva no swelling or erythema ENT/Mouth: Ear canals normal without obstruction, swelling, erythema, or discharge.  TMs normal bilaterally with no erythema, bulging, retraction, or loss of landmark.  Oropharynx moist and clear with no exudate, erythema, or swelling.   Neck: Supple, thyroid normal. No bruits.  No cervical adenopathy Respiratory: Respiratory effort normal, Breath sounds clear A&P without wheeze, rhonchi, rales.   Cardio: RRR without murmurs, rubs or gallops. Brisk  peripheral pulses without edema.  Chest: symmetric, with normal excursions Breasts: Symmetric, without lumps, nipple discharge, retractions.  Abdomen: Soft, nontender, no guarding, rebound, hernias, masses, or organomegaly.  Lymphatics: Non tender without lymphadenopathy.  Genitourinary: defer  Musculoskeletal: Full ROM all peripheral extremities,5/5 strength, and normal gait. She has neck paraspinal and trap muscle tenderness Skin: Warm, dry without rashes, lesions, ecchymosis. Neuro: Awake and oriented X 3, Cranial nerves intact, reflexes equal bilaterally. Normal muscle tone, no cerebellar symptoms. Sensation intact.  Psych:  normal affect, Insight and Judgment appropriate.   EKG: Sinus bradycardia  Over 40 minutes of exam, counseling, chart review and critical decision making was performed  Kaitlyn [name] 2:49 PM [facility]

## 2021-09-22 ENCOUNTER — Encounter: Payer: Managed Care, Other (non HMO) | Admitting: Adult Health

## 2021-09-22 ENCOUNTER — Ambulatory Visit (INDEPENDENT_AMBULATORY_CARE_PROVIDER_SITE_OTHER): Payer: Managed Care, Other (non HMO) | Admitting: Nurse Practitioner

## 2021-09-22 ENCOUNTER — Encounter: Payer: Self-pay | Admitting: Nurse Practitioner

## 2021-09-22 ENCOUNTER — Other Ambulatory Visit: Payer: Self-pay

## 2021-09-22 VITALS — BP 146/84 | HR 88 | Temp 97.9°F | Ht 63.0 in | Wt 157.2 lb

## 2021-09-22 DIAGNOSIS — R7309 Other abnormal glucose: Secondary | ICD-10-CM

## 2021-09-22 DIAGNOSIS — Z131 Encounter for screening for diabetes mellitus: Secondary | ICD-10-CM

## 2021-09-22 DIAGNOSIS — Z1211 Encounter for screening for malignant neoplasm of colon: Secondary | ICD-10-CM

## 2021-09-22 DIAGNOSIS — Z79899 Other long term (current) drug therapy: Secondary | ICD-10-CM

## 2021-09-22 DIAGNOSIS — Z0000 Encounter for general adult medical examination without abnormal findings: Secondary | ICD-10-CM | POA: Diagnosis not present

## 2021-09-22 DIAGNOSIS — Z13220 Encounter for screening for lipoid disorders: Secondary | ICD-10-CM

## 2021-09-22 DIAGNOSIS — E559 Vitamin D deficiency, unspecified: Secondary | ICD-10-CM

## 2021-09-22 DIAGNOSIS — F330 Major depressive disorder, recurrent, mild: Secondary | ICD-10-CM

## 2021-09-22 DIAGNOSIS — Z1389 Encounter for screening for other disorder: Secondary | ICD-10-CM

## 2021-09-22 DIAGNOSIS — I10 Essential (primary) hypertension: Secondary | ICD-10-CM | POA: Diagnosis not present

## 2021-09-22 DIAGNOSIS — E782 Mixed hyperlipidemia: Secondary | ICD-10-CM

## 2021-09-22 DIAGNOSIS — J302 Other seasonal allergic rhinitis: Secondary | ICD-10-CM

## 2021-09-22 DIAGNOSIS — Z136 Encounter for screening for cardiovascular disorders: Secondary | ICD-10-CM | POA: Diagnosis not present

## 2021-09-22 DIAGNOSIS — N838 Other noninflammatory disorders of ovary, fallopian tube and broad ligament: Secondary | ICD-10-CM

## 2021-09-22 DIAGNOSIS — K219 Gastro-esophageal reflux disease without esophagitis: Secondary | ICD-10-CM

## 2021-09-22 DIAGNOSIS — K760 Fatty (change of) liver, not elsewhere classified: Secondary | ICD-10-CM

## 2021-09-22 DIAGNOSIS — I700 Atherosclerosis of aorta: Secondary | ICD-10-CM

## 2021-09-22 MED ORDER — METOPROLOL SUCCINATE ER 25 MG PO TB24: 25.0000 mg | ORAL_TABLET | Freq: Every day | ORAL | 3 refills | Status: DC

## 2021-09-22 MED ORDER — ALBUTEROL SULFATE HFA 108 (90 BASE) MCG/ACT IN AERS: 2.0000 | INHALATION_SPRAY | RESPIRATORY_TRACT | 0 refills | Status: AC | PRN

## 2021-09-22 NOTE — Patient Instructions (Addendum)
Call to schedule mammogram    GENERAL HEALTH GOALS   Know what a healthy weight is for you (roughly BMI <25) and aim to maintain this   Aim for 7+ servings of fruits and vegetables daily   70-80+ fluid ounces of water or unsweet tea for healthy kidneys   Limit to max 1 drink of alcohol per day; avoid smoking/tobacco   Limit animal fats in diet for cholesterol and heart health - choose grass fed whenever available   Avoid highly processed foods, and foods high in saturated/trans fats   Aim for low stress - take time to unwind and care for your mental health   Aim for 150 min of moderate intensity exercise weekly for heart health, and weights twice weekly for bone health   Aim for 7-9 hours of sleep daily

## 2021-09-23 ENCOUNTER — Encounter: Payer: Self-pay | Admitting: Physician Assistant

## 2021-09-23 ENCOUNTER — Other Ambulatory Visit: Payer: Self-pay | Admitting: Nurse Practitioner

## 2021-09-23 DIAGNOSIS — R8290 Unspecified abnormal findings in urine: Secondary | ICD-10-CM

## 2021-09-23 LAB — CBC WITH DIFFERENTIAL/PLATELET
Absolute Monocytes: 443 cells/uL (ref 200–950)
Basophils Absolute: 22 cells/uL (ref 0–200)
Basophils Relative: 0.4 %
Eosinophils Absolute: 173 cells/uL (ref 15–500)
Eosinophils Relative: 3.2 %
HCT: 43 % (ref 35.0–45.0)
Hemoglobin: 14.3 g/dL (ref 11.7–15.5)
Lymphs Abs: 2938 cells/uL (ref 850–3900)
MCH: 32.2 pg (ref 27.0–33.0)
MCHC: 33.3 g/dL (ref 32.0–36.0)
MCV: 96.8 fL (ref 80.0–100.0)
MPV: 10.1 fL (ref 7.5–12.5)
Monocytes Relative: 8.2 %
Neutro Abs: 1825 cells/uL (ref 1500–7800)
Neutrophils Relative %: 33.8 %
Platelets: 194 10*3/uL (ref 140–400)
RBC: 4.44 10*6/uL (ref 3.80–5.10)
RDW: 12.4 % (ref 11.0–15.0)
Total Lymphocyte: 54.4 %
WBC: 5.4 10*3/uL (ref 3.8–10.8)

## 2021-09-23 LAB — COMPLETE METABOLIC PANEL WITH GFR
AG Ratio: 1.8 (calc) (ref 1.0–2.5)
ALT: 23 U/L (ref 6–29)
AST: 21 U/L (ref 10–35)
Albumin: 4.6 g/dL (ref 3.6–5.1)
Alkaline phosphatase (APISO): 95 U/L (ref 37–153)
BUN: 15 mg/dL (ref 7–25)
CO2: 29 mmol/L (ref 20–32)
Calcium: 9.6 mg/dL (ref 8.6–10.4)
Chloride: 105 mmol/L (ref 98–110)
Creat: 0.91 mg/dL (ref 0.50–1.05)
Globulin: 2.6 g/dL (calc) (ref 1.9–3.7)
Glucose, Bld: 70 mg/dL (ref 65–99)
Potassium: 4 mmol/L (ref 3.5–5.3)
Sodium: 142 mmol/L (ref 135–146)
Total Bilirubin: 0.3 mg/dL (ref 0.2–1.2)
Total Protein: 7.2 g/dL (ref 6.1–8.1)
eGFR: 71 mL/min/{1.73_m2} (ref >=60)

## 2021-09-23 LAB — URINALYSIS, ROUTINE W REFLEX MICROSCOPIC
Bilirubin Urine: NEGATIVE
Glucose, UA: NEGATIVE
Hyaline Cast: NONE SEEN /LPF
Ketones, ur: NEGATIVE
Nitrite: POSITIVE — AB
Specific Gravity, Urine: 1.02 (ref 1.001–1.035)
Squamous Epithelial / HPF: NONE SEEN /HPF (ref <=5)
WBC, UA: >=60 /HPF — AB (ref 0–5)
pH: 6.5 (ref 5.0–8.0)

## 2021-09-23 LAB — MICROSCOPIC MESSAGE

## 2021-09-23 LAB — LIPID PANEL
Cholesterol: 200 mg/dL — ABNORMAL HIGH (ref <200)
HDL: 59 mg/dL (ref >=50)
LDL Cholesterol (Calc): 109 mg/dL (calc) — ABNORMAL HIGH
Non-HDL Cholesterol (Calc): 141 mg/dL (calc) — ABNORMAL HIGH (ref <130)
Total CHOL/HDL Ratio: 3.4 (calc) (ref <5.0)
Triglycerides: 199 mg/dL — ABNORMAL HIGH (ref <150)

## 2021-09-23 LAB — HEMOGLOBIN A1C
Hgb A1c MFr Bld: 5.4 % of total Hgb (ref <5.7)
Mean Plasma Glucose: 108 mg/dL
eAG (mmol/L): 6 mmol/L

## 2021-09-23 LAB — MICROALBUMIN / CREATININE URINE RATIO
Creatinine, Urine: 107 mg/dL (ref 20–275)
Microalb Creat Ratio: 42 mcg/mg creat — ABNORMAL HIGH (ref <30)
Microalb, Ur: 4.5 mg/dL

## 2021-09-23 LAB — TSH: TSH: 0.91 mIU/L (ref 0.40–4.50)

## 2021-09-23 LAB — VITAMIN D 25 HYDROXY (VIT D DEFICIENCY, FRACTURES): Vit D, 25-Hydroxy: 96 ng/mL (ref 30–100)

## 2021-09-23 LAB — MAGNESIUM: Magnesium: 2.1 mg/dL (ref 1.5–2.5)

## 2021-09-25 LAB — URINE CULTURE
MICRO NUMBER:: 12505146
SPECIMEN QUALITY:: ADEQUATE

## 2021-09-26 ENCOUNTER — Other Ambulatory Visit: Payer: Self-pay | Admitting: Nurse Practitioner

## 2021-09-26 DIAGNOSIS — N390 Urinary tract infection, site not specified: Secondary | ICD-10-CM

## 2021-09-26 MED ORDER — NITROFURANTOIN MONOHYD MACRO 100 MG PO CAPS: 100.0000 mg | ORAL_CAPSULE | Freq: Two times a day (BID) | ORAL | 0 refills | Status: AC

## 2022-02-02 ENCOUNTER — Other Ambulatory Visit: Payer: Self-pay

## 2022-02-02 MED ORDER — METHOCARBAMOL 500 MG PO TABS: 500.0000 mg | ORAL_TABLET | Freq: Four times a day (QID) | ORAL | 1 refills | Status: DC | PRN

## 2022-03-03 ENCOUNTER — Other Ambulatory Visit: Payer: Self-pay | Admitting: Adult Health
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
## 2022-03-23 ENCOUNTER — Ambulatory Visit (INDEPENDENT_AMBULATORY_CARE_PROVIDER_SITE_OTHER): Payer: Managed Care, Other (non HMO) | Admitting: Nurse Practitioner

## 2022-03-23 ENCOUNTER — Encounter: Payer: Self-pay | Admitting: Nurse Practitioner

## 2022-03-23 VITALS — BP 140/72 | HR 75 | Temp 97.9°F | Wt 156.2 lb

## 2022-03-23 DIAGNOSIS — F330 Major depressive disorder, recurrent, mild: Secondary | ICD-10-CM

## 2022-03-23 DIAGNOSIS — E782 Mixed hyperlipidemia: Secondary | ICD-10-CM | POA: Diagnosis not present

## 2022-03-23 DIAGNOSIS — M62838 Other muscle spasm: Secondary | ICD-10-CM

## 2022-03-23 DIAGNOSIS — R7309 Other abnormal glucose: Secondary | ICD-10-CM

## 2022-03-23 DIAGNOSIS — E663 Overweight: Secondary | ICD-10-CM

## 2022-03-23 DIAGNOSIS — I10 Essential (primary) hypertension: Secondary | ICD-10-CM | POA: Diagnosis not present

## 2022-03-23 DIAGNOSIS — G4700 Insomnia, unspecified: Secondary | ICD-10-CM

## 2022-03-23 DIAGNOSIS — K760 Fatty (change of) liver, not elsewhere classified: Secondary | ICD-10-CM

## 2022-03-23 DIAGNOSIS — K219 Gastro-esophageal reflux disease without esophagitis: Secondary | ICD-10-CM

## 2022-03-23 DIAGNOSIS — I700 Atherosclerosis of aorta: Secondary | ICD-10-CM

## 2022-03-23 DIAGNOSIS — Z79899 Other long term (current) drug therapy: Secondary | ICD-10-CM

## 2022-03-23 DIAGNOSIS — E559 Vitamin D deficiency, unspecified: Secondary | ICD-10-CM

## 2022-03-23 DIAGNOSIS — F419 Anxiety disorder, unspecified: Secondary | ICD-10-CM

## 2022-03-23 MED ORDER — TRAZODONE HCL 50 MG PO TABS: ORAL_TABLET | ORAL | 1 refills | Status: DC

## 2022-03-23 MED ORDER — OLMESARTAN MEDOXOMIL 40 MG PO TABS: 40.0000 mg | ORAL_TABLET | Freq: Every day | ORAL | 1 refills | Status: DC

## 2022-03-23 MED ORDER — AMLODIPINE BESYLATE 5 MG PO TABS: 5.0000 mg | ORAL_TABLET | Freq: Every day | ORAL | 3 refills | Status: DC

## 2022-03-23 MED ORDER — BUSPIRONE HCL 5 MG PO TABS: 5.0000 mg | ORAL_TABLET | Freq: Three times a day (TID) | ORAL | 2 refills | Status: DC

## 2022-03-23 MED ORDER — BACLOFEN 10 MG PO TABS
10.0000 mg | ORAL_TABLET | Freq: Two times a day (BID) | ORAL | 1 refills | Status: DC
Start: 2022-03-23 — End: 2023-07-09

## 2022-03-23 NOTE — Patient Instructions (Signed)
Buspirone Tablets ?What is this medication? ?BUSPIRONE (byoo SPYE rone) treats anxiety. It works by balancing the levels of dopamine and serotonin in your brain, hormones that help regulate mood. ?This medicine may be used for other purposes; ask your health care provider or pharmacist if you have questions. ?COMMON BRAND NAME(S): BuSpar, Buspar Dividose ?What should I tell my care team before I take this medication? ?They need to know if you have any of these conditions: ?Kidney or liver disease ?An unusual or allergic reaction to buspirone, other medications, foods, dyes, or preservatives ?Pregnant or trying to get pregnant ?Breast-feeding ?How should I use this medication? ?Take this medication by mouth with a glass of water. Follow the directions on the prescription label. You may take this medication with or without food. To ensure that this medication always works the same way for you, you should take it either always with or always without food. Take your doses at regular intervals. Do not take your medication more often than directed. Do not stop taking except on the advice of your care team. ?Talk to your care team about the use of this medication in children. Special care may be needed. ?Overdosage: If you think you have taken too much of this medicine contact a poison control center or emergency room at once. ?NOTE: This medicine is only for you. Do not share this medicine with others. ?What if I miss a dose? ?If you miss a dose, take it as soon as you can. If it is almost time for your next dose, take only that dose. Do not take double or extra doses. ?What may interact with this medication? ?Do not take this medication with any of the following: ?Linezolid ?MAOIs like Carbex, Eldepryl, Marplan, Nardil, and Parnate ?Methylene blue ?Procarbazine ?This medication may also interact with the following: ?Diazepam ?Digoxin ?Diltiazem ?Erythromycin ?Grapefruit juice ?Haloperidol ?Medications for mental  depression or mood problems ?Medications for seizures like carbamazepine, phenobarbital and phenytoin ?Nefazodone ?Other medications for anxiety ?Rifampin ?Ritonavir ?Some antifungal medications like itraconazole, ketoconazole, and voriconazole ?Verapamil ?Warfarin ?This list may not describe all possible interactions. Give your health care provider a list of all the medicines, herbs, non-prescription drugs, or dietary supplements you use. Also tell them if you smoke, drink alcohol, or use illegal drugs. Some items may interact with your medicine. ?What should I watch for while using this medication? ?Visit your care team for regular checks on your progress. It may take 1 to 2 weeks before your anxiety gets better. ?You may get drowsy or dizzy. Do not drive, use machinery, or do anything that needs mental alertness until you know how this medication affects you. Do not stand or sit up quickly, especially if you are an older patient. This reduces the risk of dizzy or fainting spells. Alcohol can make you more drowsy and dizzy. Avoid alcoholic drinks. ?What side effects may I notice from receiving this medication? ?Side effects that you should report to your care team as soon as possible: ?Allergic reactions--skin rash, itching, hives, swelling of the face, lips, tongue, or throat ?Irritability, confusion, fast or irregular heartbeat, muscle stiffness, twitching muscles, sweating, high fever, seizure, chills, vomiting, diarrhea, which may be signs of serotonin syndrome ?Side effects that usually do not require medical attention (report to your care team if they continue or are bothersome): ?Anxiety or nervousness ?Dizziness ?Drowsiness ?Headache ?Nausea ?Trouble sleeping ?This list may not describe all possible side effects. Call your doctor for medical advice about side effects. You may report   side effects to FDA at 1-800-FDA-1088. ?Where should I keep my medication? ?Keep out of the reach of children. ?Store at room  temperature below 30 degrees C (86 degrees F). Protect from light. Keep container tightly closed. Throw away any unused medication after the expiration date. ?NOTE: This sheet is a summary. It may not cover all possible information. If you have questions about this medicine, talk to your doctor, pharmacist, or health care provider. ?? 2022 Elsevier/Gold Standard (2021-02-24 00:00:00) ? ?

## 2022-03-24 LAB — LIPID PANEL
Cholesterol: 159 mg/dL (ref <200)
HDL: 60 mg/dL (ref >=50)
LDL Cholesterol (Calc): 80 mg/dL (calc)
Non-HDL Cholesterol (Calc): 99 mg/dL (calc) (ref <130)
Total CHOL/HDL Ratio: 2.7 (calc) (ref <5.0)
Triglycerides: 103 mg/dL (ref <150)

## 2022-03-24 LAB — COMPLETE METABOLIC PANEL WITH GFR
AG Ratio: 1.8 (calc) (ref 1.0–2.5)
ALT: 19 U/L (ref 6–29)
AST: 18 U/L (ref 10–35)
Albumin: 4.4 g/dL (ref 3.6–5.1)
Alkaline phosphatase (APISO): 87 U/L (ref 37–153)
BUN: 14 mg/dL (ref 7–25)
CO2: 29 mmol/L (ref 20–32)
Calcium: 9.8 mg/dL (ref 8.6–10.4)
Chloride: 108 mmol/L (ref 98–110)
Creat: 0.93 mg/dL (ref 0.50–1.05)
Globulin: 2.5 g/dL (calc) (ref 1.9–3.7)
Glucose, Bld: 80 mg/dL (ref 65–99)
Potassium: 4 mmol/L (ref 3.5–5.3)
Sodium: 144 mmol/L (ref 135–146)
Total Bilirubin: 0.4 mg/dL (ref 0.2–1.2)
Total Protein: 6.9 g/dL (ref 6.1–8.1)
eGFR: 69 mL/min/{1.73_m2} (ref >=60)

## 2022-03-24 LAB — CBC WITH DIFFERENTIAL/PLATELET
Absolute Monocytes: 331 cells/uL (ref 200–950)
Basophils Absolute: 9 cells/uL (ref 0–200)
Basophils Relative: 0.2 %
Eosinophils Absolute: 60 cells/uL (ref 15–500)
Eosinophils Relative: 1.4 %
HCT: 43.9 % (ref 35.0–45.0)
Hemoglobin: 14.7 g/dL (ref 11.7–15.5)
Lymphs Abs: 1587 cells/uL (ref 850–3900)
MCH: 33 pg (ref 27.0–33.0)
MCHC: 33.5 g/dL (ref 32.0–36.0)
MCV: 98.4 fL (ref 80.0–100.0)
MPV: 10.6 fL (ref 7.5–12.5)
Monocytes Relative: 7.7 %
Neutro Abs: 2313 cells/uL (ref 1500–7800)
Neutrophils Relative %: 53.8 %
Platelets: 192 10*3/uL (ref 140–400)
RBC: 4.46 10*6/uL (ref 3.80–5.10)
RDW: 12.3 % (ref 11.0–15.0)
Total Lymphocyte: 36.9 %
WBC: 4.3 10*3/uL (ref 3.8–10.8)

## 2022-03-24 LAB — MAGNESIUM: Magnesium: 2.1 mg/dL (ref 1.5–2.5)

## 2022-06-02 ENCOUNTER — Other Ambulatory Visit: Payer: Self-pay | Admitting: Adult Health

## 2022-06-02 DIAGNOSIS — E782 Mixed hyperlipidemia: Secondary | ICD-10-CM

## 2022-07-12 ENCOUNTER — Emergency Department (HOSPITAL_COMMUNITY): Payer: Managed Care, Other (non HMO)

## 2022-07-12 ENCOUNTER — Emergency Department (HOSPITAL_COMMUNITY)
Admission: EM | Admit: 2022-07-12 | Discharge: 2022-07-12 | Disposition: A | Discharge status: Home / Self Care | Payer: Managed Care, Other (non HMO) | Attending: Emergency Medicine | Admitting: Emergency Medicine

## 2022-07-12 ENCOUNTER — Other Ambulatory Visit: Payer: Self-pay

## 2022-07-12 DIAGNOSIS — Z8673 Personal history of transient ischemic attack (TIA), and cerebral infarction without residual deficits: Secondary | ICD-10-CM

## 2022-07-12 DIAGNOSIS — I6930 Unspecified sequelae of cerebral infarction: Secondary | ICD-10-CM | POA: Diagnosis not present

## 2022-07-12 DIAGNOSIS — I10 Essential (primary) hypertension: Secondary | ICD-10-CM | POA: Diagnosis not present

## 2022-07-12 DIAGNOSIS — R519 Headache, unspecified: Secondary | ICD-10-CM | POA: Diagnosis present

## 2022-07-12 LAB — COMPREHENSIVE METABOLIC PANEL
ALT: 31 U/L (ref 0–44)
AST: 29 U/L (ref 15–41)
Albumin: 4.3 g/dL (ref 3.5–5.0)
Alkaline Phosphatase: 88 U/L (ref 38–126)
Anion gap: 7 (ref 5–15)
BUN: 16 mg/dL (ref 8–23)
CO2: 25 mmol/L (ref 22–32)
Calcium: 9.9 mg/dL (ref 8.9–10.3)
Chloride: 110 mmol/L (ref 98–111)
Creatinine, Ser: 0.93 mg/dL (ref 0.44–1.00)
GFR, Estimated: >60 mL/min (ref >60)
Glucose, Bld: 99 mg/dL (ref 70–99)
Potassium: 3.5 mmol/L (ref 3.5–5.1)
Sodium: 142 mmol/L (ref 135–145)
Total Bilirubin: 0.3 mg/dL (ref 0.3–1.2)
Total Protein: 7.6 g/dL (ref 6.5–8.1)

## 2022-07-12 LAB — CBC
HCT: 43.1 % (ref 36.0–46.0)
Hemoglobin: 14.9 g/dL (ref 12.0–15.0)
MCH: 33.6 pg (ref 26.0–34.0)
MCHC: 34.6 g/dL (ref 30.0–36.0)
MCV: 97.3 fL (ref 80.0–100.0)
Platelets: 179 10*3/uL (ref 150–400)
RBC: 4.43 MIL/uL (ref 3.87–5.11)
RDW: 12.3 % (ref 11.5–15.5)
WBC: 5.8 10*3/uL (ref 4.0–10.5)
nRBC: 0 % (ref 0.0–0.2)

## 2022-07-12 LAB — I-STAT CHEM 8, ED
BUN: 14 mg/dL (ref 8–23)
Calcium, Ion: 1.21 mmol/L (ref 1.15–1.40)
Chloride: 105 mmol/L (ref 98–111)
Creatinine, Ser: 1 mg/dL (ref 0.44–1.00)
Glucose, Bld: 95 mg/dL (ref 70–99)
HCT: 44 % (ref 36.0–46.0)
Hemoglobin: 15 g/dL (ref 12.0–15.0)
Potassium: 3.3 mmol/L — ABNORMAL LOW (ref 3.5–5.1)
Sodium: 143 mmol/L (ref 135–145)
TCO2: 25 mmol/L (ref 22–32)

## 2022-07-12 LAB — DIFFERENTIAL
Abs Immature Granulocytes: 0.01 10*3/uL (ref 0.00–0.07)
Basophils Absolute: 0 10*3/uL (ref 0.0–0.1)
Basophils Relative: 0 %
Eosinophils Absolute: 0.1 10*3/uL (ref 0.0–0.5)
Eosinophils Relative: 2 %
Immature Granulocytes: 0 %
Lymphocytes Relative: 41 %
Lymphs Abs: 2.4 10*3/uL (ref 0.7–4.0)
Monocytes Absolute: 0.5 10*3/uL (ref 0.1–1.0)
Monocytes Relative: 8 %
Neutro Abs: 2.8 10*3/uL (ref 1.7–7.7)
Neutrophils Relative %: 49 %

## 2022-07-12 LAB — PROTIME-INR
INR: 1 (ref 0.8–1.2)
Prothrombin Time: 12.8 seconds (ref 11.4–15.2)

## 2022-07-12 LAB — CBG MONITORING, ED: Glucose-Capillary: 103 mg/dL — ABNORMAL HIGH (ref 70–99)

## 2022-07-12 LAB — APTT: aPTT: 32 seconds (ref 24–36)

## 2022-07-12 LAB — ETHANOL: Alcohol, Ethyl (B): <10 mg/dL (ref <10)

## 2022-07-12 MED ORDER — GADOBUTROL 1 MMOL/ML IV SOLN
7.0000 mL | Freq: Once | INTRAVENOUS | Status: AC | PRN
Start: 2022-07-12 — End: 2022-07-12
  Administered 2022-07-12: 7 mL via INTRAVENOUS

## 2022-07-12 MED ORDER — ASPIRIN 81 MG PO CHEW: 81.0000 mg | CHEWABLE_TABLET | Freq: Every day | ORAL | 0 refills | Status: AC

## 2022-07-12 NOTE — ED Provider Notes (Signed)
[facility]-EMERGENCY DEPT Provider Note   CSN: [id number] Arrival date & time: 07/12/22  1957     History  Chief Complaint  Patient presents with   Headache    Kaitlyn [name] is a 66 y.o. female history of hypertension, here presenting with left lower quadrantanopsia.  Patient states that she went to see her ophthalmologist today.  She states that is a routine checkup for her glaucoma.  Her eye pressures were normal today.  She had a detailed eye exam and was noted to have left lower quadrantanopsia bilaterally.  She was sent in by ophthalmology to get MRI brain to rule out a stroke or mass in her brain.  Patient denies any trouble speaking or focal weakness  The history is provided by the patient.       Home Medications Prior to Admission medications   Medication Sig Start Date End Date Taking? Authorizing Provider  albuterol (VENTOLIN HFA) 108 (90 Base) MCG/ACT inhaler Inhale 2 puffs into the lungs every 4 (four) hours as needed for wheezing or shortness of breath. 09/22/21   Raynelle Dick, NP  amLODipine (NORVASC) 5 MG tablet Take 1 tablet (5 mg total) by mouth daily. 03/23/22 03/23/23  Raynelle Dick, NP  baclofen (LIORESAL) 10 MG tablet Take 1 tablet (10 mg total) by mouth 2 (two) times daily. Take 1/2 to 1 tablet 2 x day if needed for muscle spasm 03/23/22   Raynelle Dick, NP  Biotin 300 MCG TABS Take 1 tablet by mouth at bedtime.     [provider]  busPIRone (BUSPAR) 5 MG tablet Take 1 tablet (5 mg total) by mouth 3 (three) times daily. 03/23/22   Raynelle Dick, NP  cetirizine (ZYRTEC ALLERGY) 10 MG tablet Take 1 tablet (10 mg total) by mouth daily. 02/01/21 02/01/22  Judd Gaudier, NP  CHOLECALCIFEROL PO Take 10,000 Units by mouth daily. Takes 15,000 units    [provider]  escitalopram (LEXAPRO) 20 MG tablet TAKE ONE TABLET BY MOUTH DAILY FOR MOOD 05/17/21   Judd Gaudier, NP  Fexofenadine HCl (ALLEGRA PO) Take by mouth.     [provider]  fluticasone (FLONASE) 50 MCG/ACT nasal spray SPRAY TWO SPRAYS IN [facility] NOSTRIL ONCE DAILY 04/11/18   Quentin Mulling R, PA-C  latanoprost (XALATAN) 0.005 % ophthalmic solution Place 1 drop into both eyes at bedtime.    [provider]  Magnesium 250 MG TABS Take 1 tablet by mouth 2 (two) times daily.     [provider]  metoprolol succinate (TOPROL-XL) 25 MG 24 hr tablet Take 1 tablet (25 mg total) by mouth daily. 09/22/21   Raynelle Dick, NP  olmesartan (BENICAR) 40 MG tablet Take 1 tablet (40 mg total) by mouth daily. 03/23/22   Raynelle Dick, NP  omeprazole (PRILOSEC) 20 MG capsule TAKE ONE CAPSULE BY MOUTH EVERY NIGHT AT BEDTIME FOR ACID INDIGESTION AND REFLUX 06/02/22   Judd Gaudier, NP  rosuvastatin (CRESTOR) 40 MG tablet TAKE ONE TABLET BY MOUTH DAILY FOR CHOLESTEROL 06/02/22   Judd Gaudier, NP  traZODone (DESYREL) 50 MG tablet Take  1/2 to 1 tablet  1 hour  before Bedtime  as needed for Sleep / Patient knows to take by mouth 03/23/22   Raynelle Dick, NP      Allergies    Ppd [tuberculin purified protein derivative] and Clinoril [sulindac]    Review of Systems   Review of Systems  Neurological:  Positive for headaches.  All other systems reviewed and are negative.   Physical Exam Updated Vital Signs BP (!) 184/102   Pulse 74   Temp 99.2 F (37.3 C) (Oral)   Resp 16   Ht 5\' 3"  (1.6 m)   Wt 73 kg   SpO2 100%   BMI 28.52 kg/m  Physical Exam Vitals and nursing note reviewed.  Constitutional:      Appearance: She is well-developed.  HENT:     Head: Normocephalic.     Mouth/Throat:     Mouth: Mucous membranes are moist.     Pharynx: Oropharynx is clear.  Eyes:     Extraocular Movements: Extraocular movements intact.     Pupils: Pupils are equal, round, and reactive to light.  Cardiovascular:     Rate and Rhythm: Normal rate and regular rhythm.  Pulmonary:     Effort: Pulmonary effort is normal.     Breath sounds:  Normal breath sounds.  Neurological:     Mental Status: She is alert and oriented to person, place, and time.     Comments: No obvious visual field cut. Nl finger to nose bilaterally, nl strength bilaterally, nl gait   Psychiatric:        Mood and Affect: Mood normal.        Behavior: Behavior normal.     ED Results / Procedures / Treatments   Labs (all labs ordered are listed, but only abnormal results are displayed) Labs Reviewed  I-STAT CHEM 8, ED - Abnormal; Notable for the following components:      Result Value   Potassium 3.3 (*)    All other components within normal limits  CBG MONITORING, ED - Abnormal; Notable for the following components:   Glucose-Capillary 103 (*)    All other components within normal limits  PROTIME-INR  APTT  CBC  DIFFERENTIAL  COMPREHENSIVE METABOLIC PANEL  ETHANOL    EKG EKG Interpretation  Date/Time:  Wednesday July 12 2022 20:43:12 EDT Ventricular Rate:  75 PR Interval:  163 QRS Duration: 92 QT Interval:  374 QTC Calculation: 418 R Axis:   52 Text Interpretation: Sinus rhythm Probable left atrial enlargement No significant change since last tracing Confirmed by 09-19-1998 Richardean Canal) on 07/12/2022 8:57:42 PM  Radiology MR BRAIN W WO CONTRAST  Result Date: 07/12/2022 CLINICAL DATA:  Left hemianopia. EXAM: MRI HEAD WITHOUT AND WITH CONTRAST TECHNIQUE: Multiplanar, multiecho pulse sequences of the brain and surrounding structures were obtained without and with intravenous contrast. CONTRAST:  45mL GADAVIST GADOBUTROL 1 MMOL/ML IV SOLN COMPARISON:  None Available. FINDINGS: Brain: No acute infarct, mass effect or extra-axial collection. No acute or chronic hemorrhage. There is multifocal hyperintense T2-weighted signal within the white matter. Generalized volume loss. The midline structures are normal. There is no abnormal contrast enhancement. Vascular: Major flow voids are preserved. Skull and upper cervical spine: Normal calvarium and skull  base. Visualized upper cervical spine and soft tissues are normal. Sinuses/Orbits:No paranasal sinus fluid levels or advanced mucosal thickening. No mastoid or middle ear effusion. Normal orbits. IMPRESSION: 1. No acute intracranial abnormality. 2. Findings of chronic microvascular ischemia and generalized volume loss. Electronically Signed   By: 11m M.D.   On: 07/12/2022 22:15    Procedures Procedures    Medications Ordered in ED Medications  gadobutrol (GADAVIST) 1 MMOL/ML injection 7 mL (7 mLs Intravenous Contrast Given 07/12/22 2142)    ED Course/ Medical Decision Making/ A&P  Medical Decision Making Dhrithi Riche is a 66 y.o. 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 This was incidentally noted on ophtho exam earlier today. She has no change in vision. Nonfocal neuro exam. Will get MRI to r/o mass vs stroke. 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 I talked to Dr. Leeroy Bock from neuro. He reviewed images and recommend ASA 81 mg daily and neuro follow up outpatient.   Problems Addressed: Chronic arterial ischemic stroke: acute illness or injury  Amount and/or Complexity of Data Reviewed Labs: ordered. Decision-making details documented in ED Course. Radiology: ordered and independent interpretation performed. Decision-making details documented in ED Course. ECG/medicine tests: ordered and independent interpretation performed. Decision-making details documented in ED Course.  Risk Prescription drug management.    Final Clinical Impression(s) / ED Diagnoses Final diagnoses:  None    Rx / DC Orders ED Discharge Orders     None         Charlynne Pander, MD 07/12/22 2256

## 2022-07-12 NOTE — ED Triage Notes (Addendum)
Pt sent here by eye doctor for concerns of stroke. Pt endorses headache. Pt denies weakness or slurred speech. Pt was seen at eye doctor for glaucoma.

## 2022-07-12 NOTE — ED Provider Triage Note (Signed)
Emergency Medicine Provider Triage Evaluation Note  Kaitlyn [name] , a 66 y.o. female  was evaluated in triage.  Pt complains of concern for TIA.  Patient was at her eye doctor this morning and he reportedly looked in her eye and said that he thinks she may have had a mini stroke.  She has not had any weakness, numbness, facial droop, confusion or visual disturbance.  She was being seen for glaucoma at the ophthalmologist.    Does have a history of hypertension, did not take her blood pressure medications prior to arrival  Review of Systems  Positive: Headache Negative:   Physical Exam  BP (!) 209/97 (BP Location: Right Arm)   Pulse 94   Temp 99.2 F (37.3 C) (Oral)   Resp 18   Ht 5\' 3"  (1.6 m)   Wt 73 kg   SpO2 99%   BMI 28.52 kg/m  Gen:   Awake, no distress   Resp:  Normal effort  MSK:   Moves extremities without difficulty  Other:  Cranial nerves II through XII grossly intact.  EOMs intact.  PERRLA.  No problem with finger-nose.  No nystagmus.  No facial droop or sensation deficit  Medical Decision Making  Medically screening exam initiated at 8:08 PM.  Appropriate orders placed.  Kaitlyn [name] was informed that the remainder of the evaluation will be completed by another provider, this initial triage assessment does not replace that evaluation, and the importance of remaining in the ED until their evaluation is complete.    Low suspicion stroke.  Maybe eye doctor was concerned for renal artery occlusion?  Unsure but will still pursue stroke work-up   Kaitlyn Koller, PA-C 07/12/22 2009

## 2022-07-12 NOTE — Discharge Instructions (Signed)
You had an old stroke causing your visual field cut   Take ASA 81 mg daily to prevent further strokes in the future   You need to call neurology office and get follow up   Return to ER if you have blurry vision, double vision, loss of vision, trouble speaking, weakness

## 2022-09-15 IMAGING — CT CT HEAD W/O CM
3 series · 15 of 47 positions shown, 18 images · non-contrast
Comparison: No pertinent prior exams are available for comparison.

CLINICAL DATA: Poly trauma, critical, head/cervical spine injury
suspected; fall, hit head on floor, hematoma left occipital skull.

EXAM:
CT HEAD WITHOUT CONTRAST
CT CERVICAL SPINE WITHOUT CONTRAST
TECHNIQUE: Multidetector CT imaging of the head and cervical spine was
performed following the standard protocol without intravenous
contrast. Multiplanar CT image reconstructions of the cervical spine
were also generated.

[Series 2: head wo · axial · 0.41mm/px · z∈[+226,+351]mm · 9 of 30 slices shown, 12 images]
[im 3/30  brain]
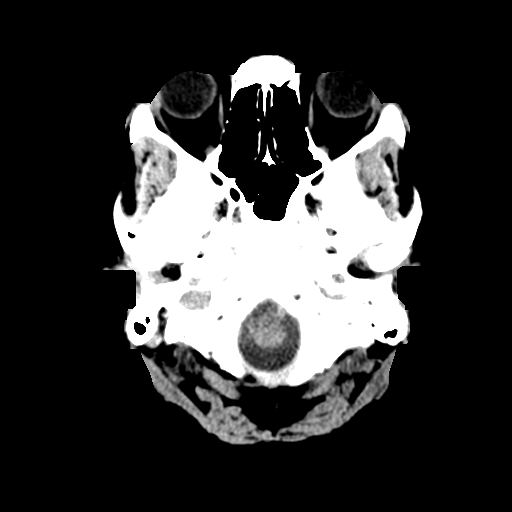
[im 3/30  bone]
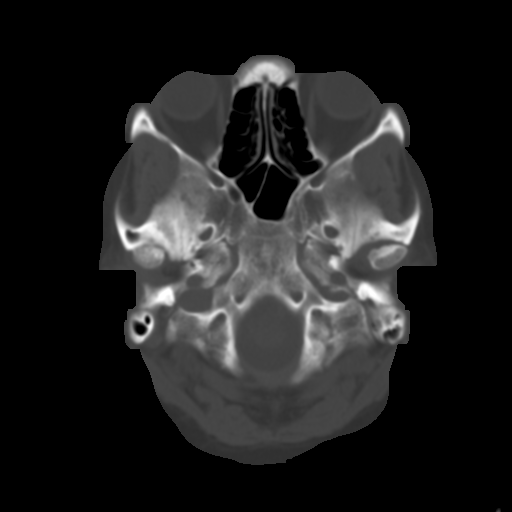
[im 6/30  brain]
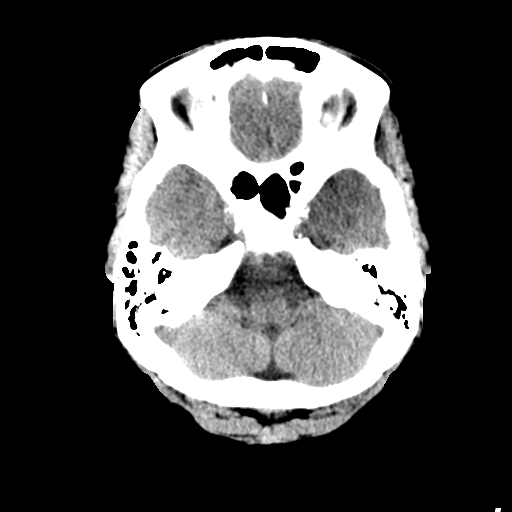
[im 9/30  brain]
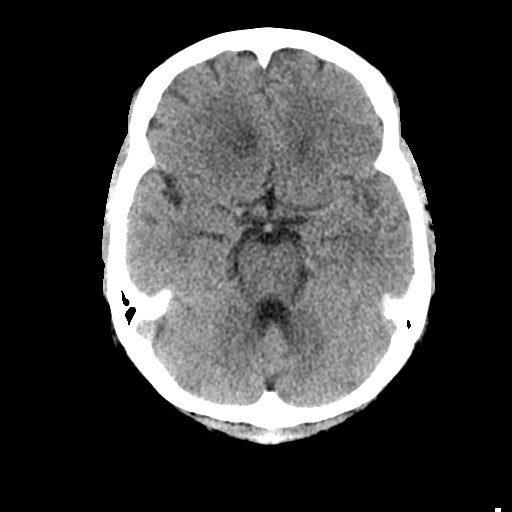
[im 12/30  brain]
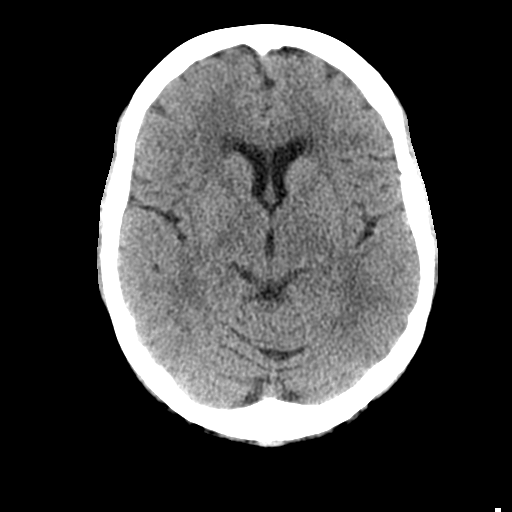
[im 16/30  brain]
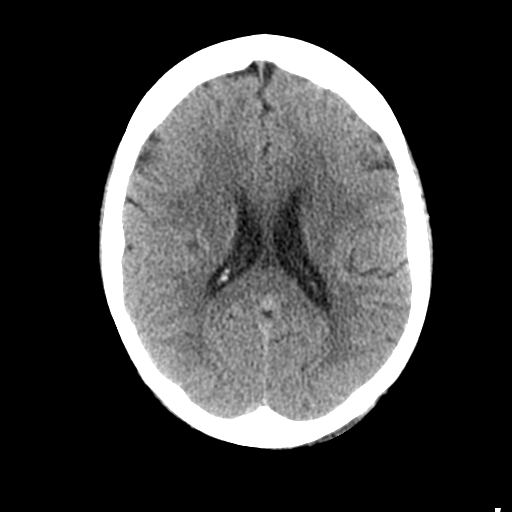
[im 16/30  bone]
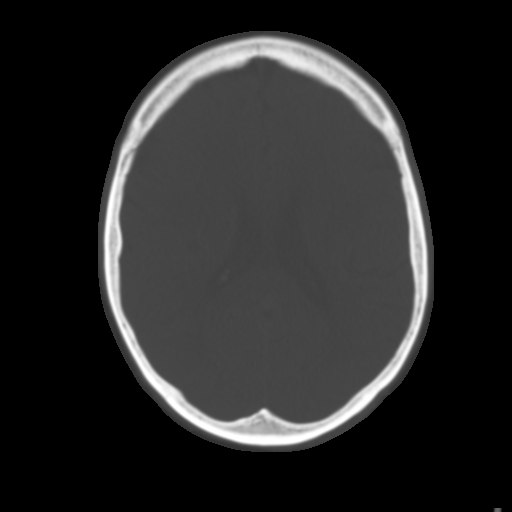
[im 19/30  brain]
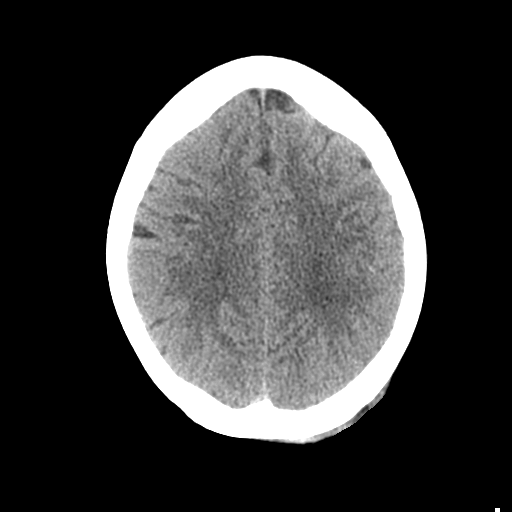
[im 22/30  brain]
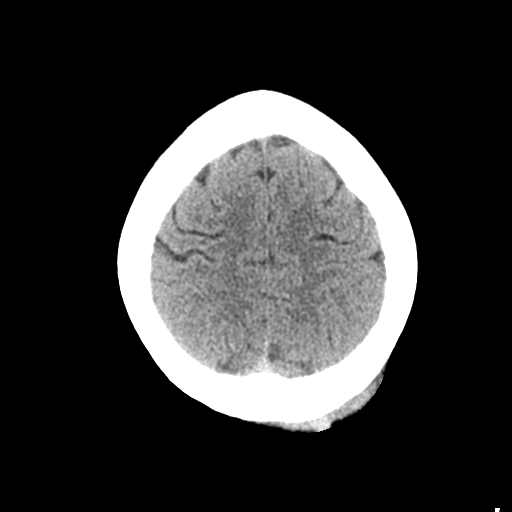
[im 25/30  brain]
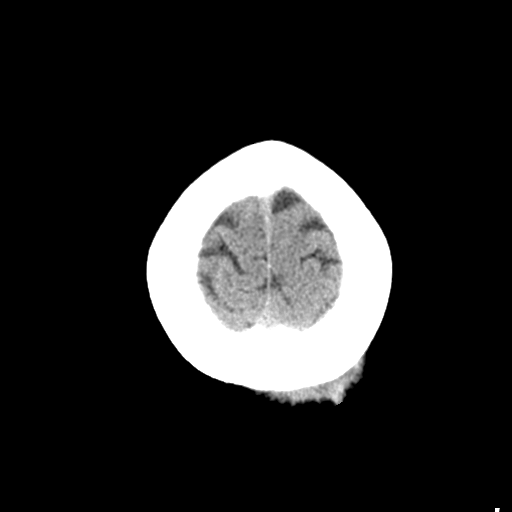
[im 28/30  brain]
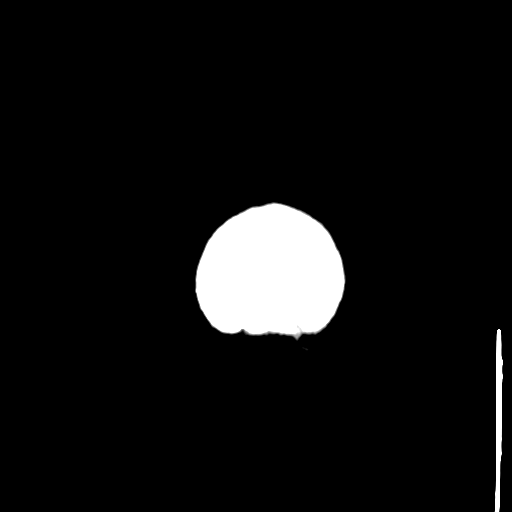
[im 28/30  bone]
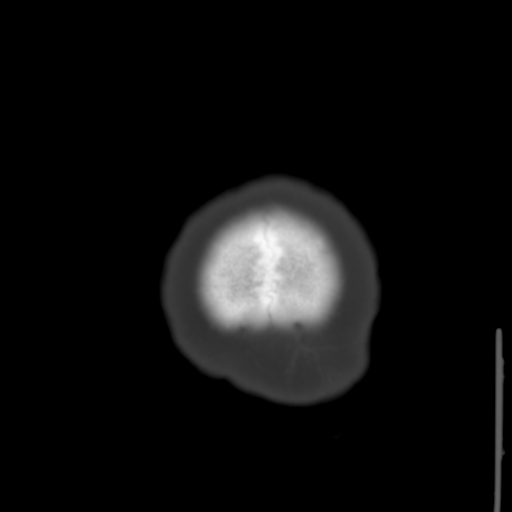

[Series 4: coronal soft tissue · coronal · 0.31mm/px · 3 of 62 slices shown]
[im 21/62  brain]
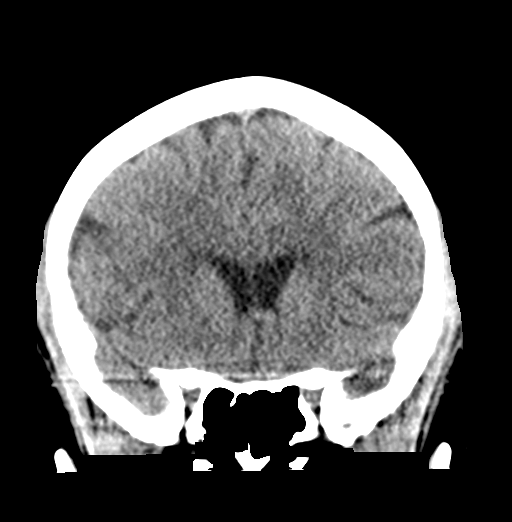
[im 28/62  brain]
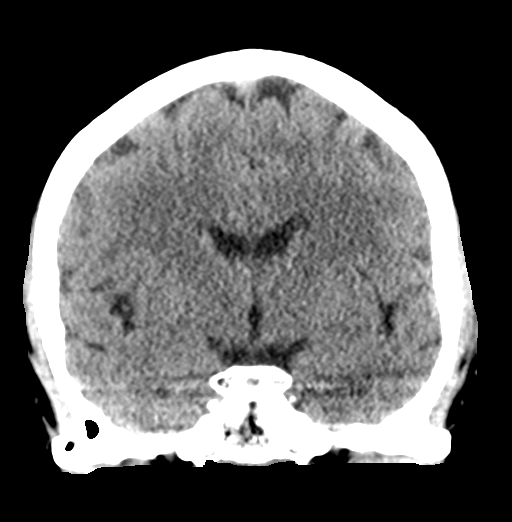
[im 34/62  brain]
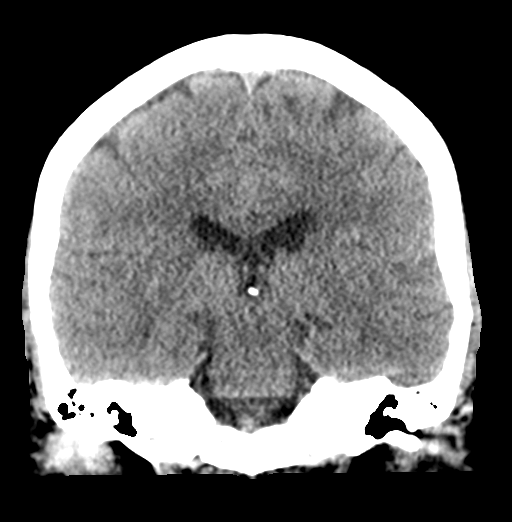

[Series 5: sagittal soft tissue · sagittal · 0.31mm/px · 3 of 53 slices shown]
[im 18/53  brain]
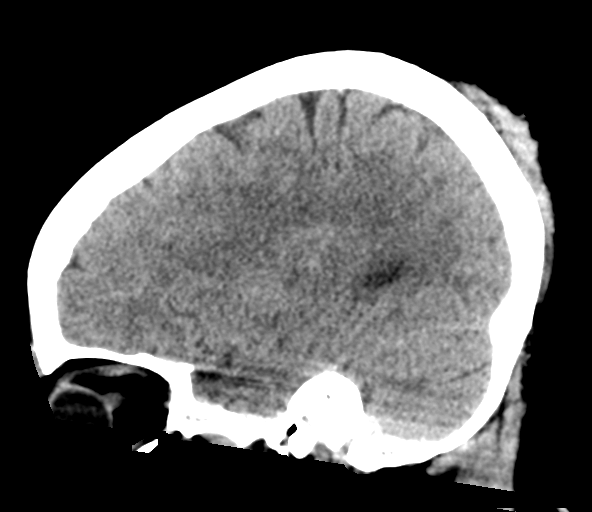
[im 27/53  brain]
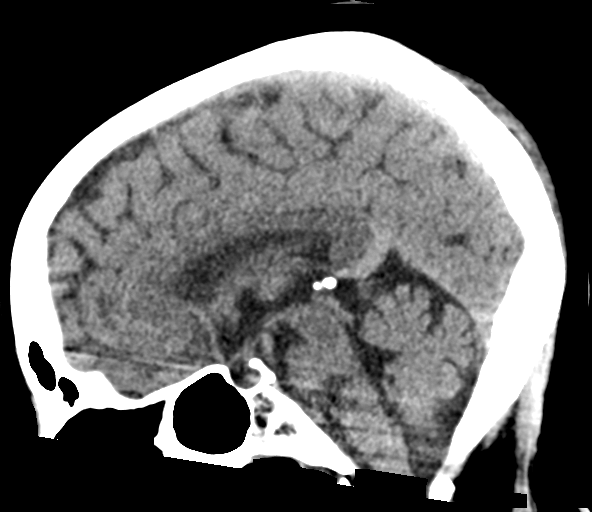
[im 35/53  brain]
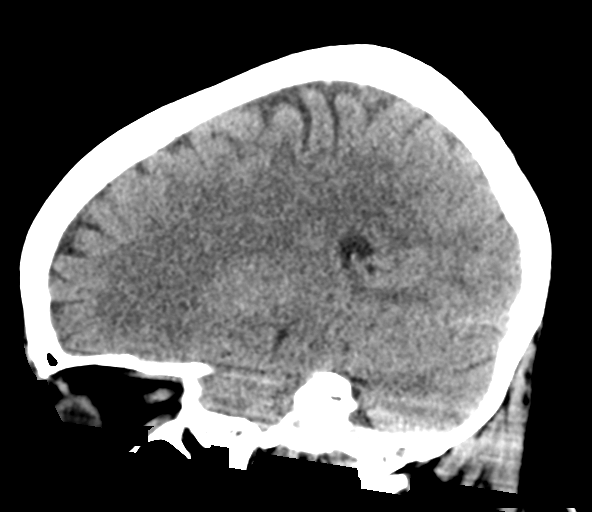

[15 of 47 positions shown; findings below may reference images not displayed]

FINDINGS: CT HEAD FINDINGS

Brain:

Cerebral volume is normal.

There is no acute intracranial hemorrhage.

No demarcated cortical infarct.

No extra-axial fluid collection.

No evidence of intracranial mass.

No midline shift.

Vascular: No hyperdense vessel.  Atherosclerotic calcifications.

Skull: Normal. Negative for fracture or focal lesion.

Sinuses/Orbits: Visualized orbits show no acute finding. No
significant paranasal sinus disease at the imaged levels. Right
mastoid effusion.

Other: Left parietooccipital scalp hematoma.

CT CERVICAL SPINE FINDINGS

Alignment: A mild cervical levocurvature may be positional. Reversal
of the expected cervical lordosis. Trace C2-C3 grade 1
retrolisthesis. 2 mm C5-C6 grade 1 retrolisthesis. Trace C6-C7 grade
1 retrolisthesis.

Skull base and vertebrae: The basion-dental and atlanto-dental
intervals are maintained.No evidence of acute fracture to the
cervical spine.

Soft tissues and spinal canal: No prevertebral fluid or swelling. No
visible canal hematoma.

Disc levels: Cervical spondylosis with multilevel disc space
narrowing and uncovertebral hypertrophy. Central disc protrusion at
C3-C4. Disc bulges at C4-C5, C5-C6 and C6-C7. Suspected
mild/moderate spinal canal stenosis at C3-C4, C4-C5 and C5-C6.
Multilevel bony neural foraminal narrowing greatest on the left at
C4-C5, C5-C6, C6-C7 and C7-T1 and on the right at C6-C7 and C7-T1.

Upper chest: No consolidation within the imaged lung apices. No
visible pneumothorax.
IMPRESSION: CT head:

1. No evidence of acute intracranial abnormality.
2. Left parietooccipital scalp hematoma.
3. Small right mastoid effusion.

CT cervical spine:

1. No evidence of acute fracture to the cervical spine.
2. Nonspecific reversal of the expected cervical lordosis.
3. Mild multilevel grade 1 spondylolisthesis as detailed.
4. Cervical spondylosis as outlined. Multifactorial mild/moderate
spinal canal stenosis at C3-C4, C4-C5 and C5-C6. Multilevel bony
neural foraminal narrowing.

## 2022-09-16 ENCOUNTER — Other Ambulatory Visit: Payer: Self-pay | Admitting: Nurse Practitioner

## 2022-09-16 DIAGNOSIS — G4700 Insomnia, unspecified: Secondary | ICD-10-CM

## 2022-09-24 NOTE — Progress Notes (Unsigned)
Complete Physical  Assessment and Plan: Kaitlyn [name] was seen today for annual exam.  Diagnoses and all orders for this visit:  Encounter for routine adult health examination without abnormal findings  Due Annually  Will call to schedule mammogram  Essential hypertension -     CBC with Differential/Platelet -     Urinalysis, Routine w reflex microscopic -     Microalbumin / creatinine urine ratio -     metoprolol succinate (TOPROL-XL) 25 MG 24 hr tablet; Take 1 tablet (25 mg total) by mouth daily. Adding back to regimen. She stopped but was not taken off by provider and BP is elevated.  Discussed importance of taking all 3 meds. - - continue medications, DASH diet, exercise and monitor at home. Call if greater than 130/80.   - Go to the ER if any chest pain, shortness of breath, nausea, dizziness, severe HA, changes vision/speech   Hyperlipidemia, mixed -     COMPLETE METABOLIC PANEL WITH GFR -     Lipid panel -     TSH  Continue medication, low saturated fat diet and increase exercise  Mild episode of recurrent major depressive disorder (HCC)  Monitor symptoms  Continue Lexapro and behavior modifications  Gastroesophageal reflux disease, unspecified whether esophagitis present -     Magnesium Continue Prilosec and behavior modifications  Hepatic steatosis  CMP  Continue diet and exercise  Vitamin D deficiency -     VITAMIN D 25 Hydroxy (Vit-D Deficiency, Fractures) Continue Vit D supplementation  Medication management -     CBC with Differential/Platelet -     COMPLETE METABOLIC PANEL WITH GFR -     Lipid panel -     TSH -     Hemoglobin A1c -     VITAMIN D 25 Hydroxy (Vit-D Deficiency, Fractures) -     Magnesium  Allergic Rhinitis Pt to switch to Allegra, Zyrtec not helping as much Use Albuterol inhaler as needed for wheezing  Abnormal glucose Continue diet and exercise -     Hemoglobin A1c  Screening for hematuria/proteinuria Routine UA with reflex  microscopic Microablumin/creatinine urine ratio  History of CVA - Uncertain when , old infart found in right occipital on MRI at ER after L lower quadrianopsia was - Continue low dose ASA daily and monitor for symptoms Control  BP, Cholesterol, Blood sugar and weight  Screening for thyroid disorder - TSH  Screening for ischemic heart disease -     EKG 12-Lead  Colon cancer screening -     Ambulatory referral to Gastroenterology     Future Appointments  Date Time Provider [place]  09/26/2023  2:00 PM Alycia Rossetti, NP [facility] None     HPI  66 y.o. female  presents for a complete physical. She has Major depression, recurrent ([place]); Esophageal reflux; Vitamin D deficiency; Hypertension; Hyperlipemia; Medication management; Overweight (BMI 25.0-29.9); Other abnormal glucose (prediabetes); Urge incontinence; Hepatic steatosis; Aortic atherosclerosis ([place]); Soft tissue lesion of pelvic region; Abnormal CT scan, pelvis; Insomnia; and Lumbar pain on their problem list.   She is divorced, works at [employer], no children.   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 Was seen in ER 07/12/22: Kaitlyn [name] is a 66 y.o. 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 This was incidentally noted on ophtho exam earlier today. She has no change in vision. Nonfocal neuro exam. Will get MRI to r/o mass vs stroke. 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 I talked to Dr. Regis Bill from neuro. He reviewed images and recommend ASA 81 mg daily and neuro follow up outpatient.    Problems Addressed: Chronic arterial ischemic stroke: acute illness or injury   Allergy symptoms are currently well controlled.   She is on lexapro 20 mg QD for recurrent depression and Buspar for worrying/anxiety and feels this is helpful.   Hx of  GERD/stricture/dysphagia, taking omeprazole daily without breakthrough.    BMI is Body mass index is 28.2 kg/m., she has not been working on diet and exercise.  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  She drinks mainly water, drinks 2 bottles of 20 oz 1 cup of coffee daily  She had [place] 12/2019 which showed diffuse hepatosteatosis   Wt Readings from Last 3 Encounters:  09/25/22 154 lb 3.2 oz (69.9 kg)  07/12/22 161 lb (73 kg)  03/23/22 156 lb 3.2 oz (70.9 kg)   She has not been checking BPs at home, today their BP is BP: 138/80.   BP Readings from Last 3 Encounters:  09/25/22 138/80  07/12/22 (!) 173/86  03/23/22 140/72   Taking Olmesartan 40 mg, and Amlodipine 5 mg QD.    She does not workout. She denies chest pain, shortness of breath, dizziness.   She is on cholesterol medication (rosuvastatin 40 mg daily) and denies myalgias. Her cholesterol is at goal. The cholesterol last visit was:  Lab Results  Component Value Date   CHOL 159 03/23/2022   HDL 60 03/23/2022   LDLCALC 80 03/23/2022   TRIG 103 03/23/2022   CHOLHDL 2.7 03/23/2022  . She has not been working on diet and exercise for prediabetes, and denies foot ulcerations, hyperglycemia, hypoglycemia , increased appetite, nausea, paresthesia of the feet, polydipsia, polyuria, visual disturbances, vomiting and weight loss. Last A1C in the office was:  Lab Results  Component Value Date   HGBA1C 5.4 09/22/2021   She has CKD IIIa associated with htn; on ARB; last GFR:  Lab Results  Component Value Date   EGFR 69 03/23/2022    Patient is on Vitamin D supplement, taking 10000 IU daily   Lab Results  Component Value Date   VD25OH 96 09/22/2021      Current Medications:  Current Outpatient Medications on File Prior to Visit  Medication Sig Dispense Refill   albuterol (VENTOLIN HFA) 108 (90 Base) MCG/ACT inhaler Inhale 2 puffs into the lungs every 4 (four) hours as needed for wheezing or shortness of  breath. 1 each 0   amLODipine (NORVASC) 5 MG tablet Take 1 tablet (5 mg total) by mouth daily. 90 tablet 3   aspirin 81 MG chewable tablet Chew 1 tablet (81 mg total) by mouth daily. 30 tablet 0   baclofen (LIORESAL) 10 MG tablet Take 1 tablet (10 mg total) by mouth 2 (two) times daily. Take 1/2 to 1 tablet 2 x day if needed for muscle spasm 60 tablet 1   Biotin 300 MCG TABS Take 1 tablet by mouth at bedtime.      busPIRone (BUSPAR) 5 MG tablet Take 1 tablet (5 mg total) by mouth 3 (three) times daily. 90 tablet 2   CHOLECALCIFEROL PO Take 10,000 Units by mouth daily. Takes 15,000 units     escitalopram (LEXAPRO) 20 MG tablet TAKE ONE TABLET BY MOUTH DAILY FOR MOOD 90  tablet 3   Fexofenadine HCl (ALLEGRA PO) Take by mouth.     fluticasone (FLONASE) 50 MCG/ACT nasal spray SPRAY TWO SPRAYS IN EACH NOSTRIL ONCE DAILY 16 g 2   latanoprost (XALATAN) 0.005 % ophthalmic solution Place 1 drop into both eyes at bedtime.     Magnesium 250 MG TABS Take 1 tablet by mouth 2 (two) times daily.      olmesartan (BENICAR) 40 MG tablet Take 1 tablet (40 mg total) by mouth daily. 90 tablet 1   omeprazole (PRILOSEC) 20 MG capsule TAKE ONE CAPSULE BY MOUTH EVERY NIGHT AT BEDTIME FOR ACID INDIGESTION AND REFLUX 90 capsule 3   rosuvastatin (CRESTOR) 40 MG tablet TAKE ONE TABLET BY MOUTH DAILY FOR CHOLESTEROL 90 tablet 3   traZODone (DESYREL) 50 MG tablet Take 1 tablet 1 hour before Bedtime                                                   /                                          TAKE                                          BY                                      MOUTH 90 tablet 0   cetirizine (ZYRTEC ALLERGY) 10 MG tablet Take 1 tablet (10 mg total) by mouth daily.     metoprolol succinate (TOPROL-XL) 25 MG 24 hr tablet Take 1 tablet (25 mg total) by mouth daily. (Patient not taking: Reported on 09/25/2022) 90 tablet 3   No current facility-administered medications on file prior to visit.    Health Maintenance:    Immunization History  Administered Date(s) Administered   Influenza Inj Mdck Quad With Preservative 09/09/2020   Influenza Split 08/30/2015   Influenza,inj,Quad PF,6+ Mos 08/09/2019   Influenza-Unspecified 10/09/2017   PFIZER(Purple Top)SARS-COV-2 Vaccination 03/12/2020, 04/02/2020   Pneumococcal-Unspecified 12/11/2008   Td 12/11/2005   Tdap 12/30/2015   Zoster Recombinat (Shingrix) 04/26/2017, 07/25/2017   Tetanus: 2017 Pneumovax: 2010, due age 37 Flu vaccine: 09/09/20 Shingrix: 2018 Covid 19: 2/2, pfizer   LMP: postmenopausal Pap: 2018 neg, HPV neg, due 2021-2023 - defer today, insufficient time  MGM:11/2019- over due plans to schedule DEXA: get at age 80  Colonoscopy: 02/2010, Dr Deatra Ina - over due plans to schedule EGD: 2015  Last Dental Exam: 2021, goes q69mLast Eye Exam: 2020, goes annually, glasses  Patient Care Team: MUnk Pinto MD as PCP - General (Internal Medicine) KInda Castle MD (Inactive) as Consulting Physician (Gastroenterology) MAzucena Fallen MD as Consulting Physician (Obstetrics and Gynecology) DNewt Minion MD as Consulting Physician (Orthopedic Surgery)  Medical History:  Past Medical History:  Diagnosis Date   Allergy    Anxiety    Depression    Esophageal stricture    GERD (gastroesophageal reflux disease)    History of COVID-19 02/23/2020   Hyperlipemia  Hypertension    Unspecified vitamin D deficiency    Allergies Allergies  Allergen Reactions   Ppd [Tuberculin Purified Protein Derivative]     + PPD with NEG CXR 1/ 2014   Clinoril [Sulindac] Rash    SURGICAL HISTORY She  has a past surgical history that includes Cholecystectomy; Esophageal dilation (2009); and LEEP (2004). FAMILY HISTORY Her family history includes Breast cancer (age of onset: 23) in her sister; Diabetes in her sister; Glaucoma in her mother and sister; Hyperlipidemia in her mother; Hypertension in her brother, mother, and sister; Stroke (age of onset:  18) in her mother. SOCIAL HISTORY She  reports that she quit smoking about 20 years ago. Her smoking use included cigarettes. She has a 1.50 pack-year smoking history. She has never used smokeless tobacco. She reports that she does not drink alcohol and does not use drugs.  Review of Systems: Review of Systems  Constitutional:  Negative for chills, diaphoresis, fever and malaise/fatigue.  HENT:  Negative for congestion, ear pain, hearing loss, sinus pain, sore throat and tinnitus.   Eyes: Negative.  Negative for blurred vision and double vision.       Some peripheral vision loss in left eye  Respiratory:  Negative for cough, hemoptysis, sputum production, shortness of breath and wheezing.   Cardiovascular:  Negative for chest pain, palpitations and leg swelling.  Gastrointestinal:  Negative for abdominal pain, blood in stool, constipation, diarrhea, heartburn, melena, nausea and vomiting.  Genitourinary: Negative.  Negative for dysuria and urgency.       Urge incontinence, wears 1-2 pads per day  Musculoskeletal:  Negative for back pain, falls, joint pain, myalgias and neck pain.  Skin: Negative.  Negative for rash.  Neurological:  Negative for dizziness, tingling, tremors, sensory change, loss of consciousness, weakness and headaches.  Endo/Heme/Allergies:  Does not bruise/bleed easily.  Psychiatric/Behavioral:  Negative for depression, substance abuse and suicidal ideas. The patient is not nervous/anxious and does not have insomnia.     Physical Exam: Estimated body mass index is 28.2 kg/m as calculated from the following:   Height as of this encounter: 5' 2"  (1.575 m).   Weight as of this encounter: 154 lb 3.2 oz (69.9 kg). BP 138/80   Pulse 76   Temp 97.9 F (36.6 C)   Ht 5' 2"  (1.575 m)   Wt 154 lb 3.2 oz (69.9 kg)   SpO2 99%   BMI 28.20 kg/m   General Appearance: Well nourished well developed, in no apparent distress.  Eyes: PERRLA, EOMs, conjunctiva no swelling or  erythema. Pigment changes right sclera ENT/Mouth: Ear canals normal without obstruction, swelling, erythema, or discharge.  TMs normal bilaterally with no erythema, bulging, retraction, or loss of landmark.  Oropharynx moist and clear with no exudate, erythema, or swelling.   Neck: Supple, thyroid normal. No bruits.  No cervical adenopathy Respiratory: Respiratory effort normal, Breath sounds clear A&P without wheeze, rhonchi, rales.   Cardio: RRR without murmurs, rubs or gallops. Brisk peripheral pulses without edema.  Chest: symmetric, with normal excursions Breasts: Symmetric, without lumps, nipple discharge, retractions.  Abdomen: Soft, nontender, no guarding, rebound, hernias, masses, or organomegaly.  Lymphatics: Non tender without lymphadenopathy.  Genitourinary: defer  Musculoskeletal: Full ROM all peripheral extremities,5/5 strength, and normal gait.  Skin: Warm, dry without rashes, lesions, ecchymosis. Neuro: Awake and oriented X 3, Cranial nerves intact, reflexes equal bilaterally. Normal muscle tone, no cerebellar symptoms. Sensation intact.  Psych:  normal affect, Insight and Judgment appropriate.   EKG: NSR,  no ST changes  Over 40 minutes of exam, counseling, chart review and critical decision making was performed  Ken Bonn Mikki Sant[place] 2:29 PM [facility]

## 2022-09-25 ENCOUNTER — Ambulatory Visit (INDEPENDENT_AMBULATORY_CARE_PROVIDER_SITE_OTHER): Payer: Managed Care, Other (non HMO) | Admitting: Nurse Practitioner

## 2022-09-25 ENCOUNTER — Encounter: Payer: Self-pay | Admitting: Nurse Practitioner

## 2022-09-25 VITALS — BP 138/80 | HR 76 | Temp 97.9°F | Ht 62.0 in | Wt 154.2 lb

## 2022-09-25 DIAGNOSIS — Z79899 Other long term (current) drug therapy: Secondary | ICD-10-CM | POA: Diagnosis not present

## 2022-09-25 DIAGNOSIS — Z1389 Encounter for screening for other disorder: Secondary | ICD-10-CM | POA: Diagnosis not present

## 2022-09-25 DIAGNOSIS — I10 Essential (primary) hypertension: Secondary | ICD-10-CM

## 2022-09-25 DIAGNOSIS — Z1329 Encounter for screening for other suspected endocrine disorder: Secondary | ICD-10-CM

## 2022-09-25 DIAGNOSIS — Z8673 Personal history of transient ischemic attack (TIA), and cerebral infarction without residual deficits: Secondary | ICD-10-CM

## 2022-09-25 DIAGNOSIS — Z0000 Encounter for general adult medical examination without abnormal findings: Secondary | ICD-10-CM

## 2022-09-25 DIAGNOSIS — Z131 Encounter for screening for diabetes mellitus: Secondary | ICD-10-CM | POA: Diagnosis not present

## 2022-09-25 DIAGNOSIS — Z136 Encounter for screening for cardiovascular disorders: Secondary | ICD-10-CM | POA: Diagnosis not present

## 2022-09-25 DIAGNOSIS — R7309 Other abnormal glucose: Secondary | ICD-10-CM

## 2022-09-25 DIAGNOSIS — I700 Atherosclerosis of aorta: Secondary | ICD-10-CM

## 2022-09-25 DIAGNOSIS — Z13220 Encounter for screening for lipoid disorders: Secondary | ICD-10-CM | POA: Diagnosis not present

## 2022-09-25 DIAGNOSIS — E663 Overweight: Secondary | ICD-10-CM

## 2022-09-25 DIAGNOSIS — E559 Vitamin D deficiency, unspecified: Secondary | ICD-10-CM | POA: Diagnosis not present

## 2022-09-25 DIAGNOSIS — F330 Major depressive disorder, recurrent, mild: Secondary | ICD-10-CM

## 2022-09-25 DIAGNOSIS — G4700 Insomnia, unspecified: Secondary | ICD-10-CM

## 2022-09-25 DIAGNOSIS — N839 Noninflammatory disorder of ovary, fallopian tube and broad ligament, unspecified: Secondary | ICD-10-CM

## 2022-09-25 DIAGNOSIS — K760 Fatty (change of) liver, not elsewhere classified: Secondary | ICD-10-CM

## 2022-09-25 DIAGNOSIS — K219 Gastro-esophageal reflux disease without esophagitis: Secondary | ICD-10-CM

## 2022-09-25 DIAGNOSIS — E782 Mixed hyperlipidemia: Secondary | ICD-10-CM

## 2022-09-25 DIAGNOSIS — Z0001 Encounter for general adult medical examination with abnormal findings: Secondary | ICD-10-CM

## 2022-09-25 NOTE — Patient Instructions (Signed)

## 2022-09-26 ENCOUNTER — Other Ambulatory Visit: Payer: Self-pay | Admitting: Nurse Practitioner

## 2022-09-26 DIAGNOSIS — R8290 Unspecified abnormal findings in urine: Secondary | ICD-10-CM

## 2022-09-26 LAB — HEMOGLOBIN A1C
Hgb A1c MFr Bld: 6 % of total Hgb — ABNORMAL HIGH (ref <5.7)
Mean Plasma Glucose: 126 mg/dL
eAG (mmol/L): 7 mmol/L

## 2022-09-26 LAB — URINALYSIS, ROUTINE W REFLEX MICROSCOPIC
Bilirubin Urine: NEGATIVE
Glucose, UA: NEGATIVE
Hyaline Cast: NONE SEEN /LPF
Ketones, ur: NEGATIVE
Nitrite: POSITIVE — AB
Specific Gravity, Urine: 1.023 (ref 1.001–1.035)
Squamous Epithelial / HPF: NONE SEEN /HPF (ref <=5)
WBC, UA: >=60 /HPF — AB (ref 0–5)
pH: 5.5 (ref 5.0–8.0)

## 2022-09-26 LAB — COMPLETE METABOLIC PANEL WITH GFR
AG Ratio: 1.7 (calc) (ref 1.0–2.5)
ALT: 22 U/L (ref 6–29)
AST: 18 U/L (ref 10–35)
Albumin: 4.2 g/dL (ref 3.6–5.1)
Alkaline phosphatase (APISO): 85 U/L (ref 37–153)
BUN: 11 mg/dL (ref 7–25)
CO2: 29 mmol/L (ref 20–32)
Calcium: 9.7 mg/dL (ref 8.6–10.4)
Chloride: 107 mmol/L (ref 98–110)
Creat: 0.83 mg/dL (ref 0.50–1.05)
Globulin: 2.5 g/dL (calc) (ref 1.9–3.7)
Glucose, Bld: 72 mg/dL (ref 65–99)
Potassium: 4.1 mmol/L (ref 3.5–5.3)
Sodium: 144 mmol/L (ref 135–146)
Total Bilirubin: 0.3 mg/dL (ref 0.2–1.2)
Total Protein: 6.7 g/dL (ref 6.1–8.1)
eGFR: 79 mL/min/{1.73_m2} (ref >=60)

## 2022-09-26 LAB — CBC WITH DIFFERENTIAL/PLATELET
Absolute Monocytes: 385 cells/uL (ref 200–950)
Basophils Absolute: 18 cells/uL (ref 0–200)
Basophils Relative: 0.5 %
Eosinophils Absolute: 119 cells/uL (ref 15–500)
Eosinophils Relative: 3.3 %
HCT: 41.7 % (ref 35.0–45.0)
Hemoglobin: 13.9 g/dL (ref 11.7–15.5)
Lymphs Abs: 1703 cells/uL (ref 850–3900)
MCH: 32.8 pg (ref 27.0–33.0)
MCHC: 33.3 g/dL (ref 32.0–36.0)
MCV: 98.3 fL (ref 80.0–100.0)
MPV: 9.9 fL (ref 7.5–12.5)
Monocytes Relative: 10.7 %
Neutro Abs: 1375 cells/uL — ABNORMAL LOW (ref 1500–7800)
Neutrophils Relative %: 38.2 %
Platelets: 236 10*3/uL (ref 140–400)
RBC: 4.24 10*6/uL (ref 3.80–5.10)
RDW: 12.3 % (ref 11.0–15.0)
Total Lymphocyte: 47.3 %
WBC: 3.6 10*3/uL — ABNORMAL LOW (ref 3.8–10.8)

## 2022-09-26 LAB — VITAMIN D 25 HYDROXY (VIT D DEFICIENCY, FRACTURES): Vit D, 25-Hydroxy: 118 ng/mL — ABNORMAL HIGH (ref 30–100)

## 2022-09-26 LAB — MICROALBUMIN / CREATININE URINE RATIO
Creatinine, Urine: 205 mg/dL (ref 20–275)
Microalb Creat Ratio: 27 mcg/mg creat (ref <30)
Microalb, Ur: 5.6 mg/dL

## 2022-09-26 LAB — LIPID PANEL
Cholesterol: 148 mg/dL (ref <200)
HDL: 51 mg/dL (ref >=50)
LDL Cholesterol (Calc): 79 mg/dL (calc)
Non-HDL Cholesterol (Calc): 97 mg/dL (calc) (ref <130)
Total CHOL/HDL Ratio: 2.9 (calc) (ref <5.0)
Triglycerides: 94 mg/dL (ref <150)

## 2022-09-26 LAB — TSH: TSH: 0.63 mIU/L (ref 0.40–4.50)

## 2022-09-26 LAB — MICROSCOPIC MESSAGE

## 2022-09-26 LAB — MAGNESIUM: Magnesium: 2.1 mg/dL (ref 1.5–2.5)

## 2022-09-28 ENCOUNTER — Other Ambulatory Visit: Payer: Self-pay | Admitting: Nurse Practitioner

## 2022-09-28 DIAGNOSIS — N3000 Acute cystitis without hematuria: Secondary | ICD-10-CM

## 2022-09-28 MED ORDER — NITROFURANTOIN MONOHYD MACRO 100 MG PO CAPS: 100.0000 mg | ORAL_CAPSULE | Freq: Two times a day (BID) | ORAL | 0 refills | Status: AC

## 2022-09-29 LAB — URINE CULTURE
MICRO NUMBER:: 14062340
SPECIMEN QUALITY:: ADEQUATE

## 2022-11-12 ENCOUNTER — Other Ambulatory Visit: Payer: Self-pay | Admitting: Nurse Practitioner

## 2022-11-12 DIAGNOSIS — F419 Anxiety disorder, unspecified: Secondary | ICD-10-CM

## 2022-12-16 ENCOUNTER — Other Ambulatory Visit: Payer: Self-pay | Admitting: Internal Medicine

## 2022-12-16 DIAGNOSIS — G4700 Insomnia, unspecified: Secondary | ICD-10-CM

## 2022-12-16 MED ORDER — TRAZODONE HCL 50 MG PO TABS: ORAL_TABLET | ORAL | 3 refills | Status: DC

## 2023-02-19 ENCOUNTER — Other Ambulatory Visit: Payer: Self-pay | Admitting: Nurse Practitioner

## 2023-02-19 DIAGNOSIS — F419 Anxiety disorder, unspecified: Secondary | ICD-10-CM

## 2023-02-23 ENCOUNTER — Other Ambulatory Visit: Payer: Self-pay | Admitting: Nurse Practitioner

## 2023-02-23 DIAGNOSIS — I10 Essential (primary) hypertension: Secondary | ICD-10-CM

## 2023-02-27 ENCOUNTER — Other Ambulatory Visit: Payer: Self-pay | Admitting: Nurse Practitioner

## 2023-02-27 DIAGNOSIS — I10 Essential (primary) hypertension: Secondary | ICD-10-CM

## 2023-03-29 ENCOUNTER — Ambulatory Visit: Payer: Managed Care, Other (non HMO) | Admitting: Nurse Practitioner
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
## 2023-04-04 ENCOUNTER — Ambulatory Visit
Admission: RE | Admit: 2023-04-04 | Discharge: 2023-04-04 | Disposition: A | Discharge status: Home / Self Care | Payer: Managed Care, Other (non HMO) | Source: Ambulatory Visit | Attending: Nurse Practitioner

## 2023-04-04 ENCOUNTER — Encounter: Payer: Self-pay | Admitting: Nurse Practitioner

## 2023-04-04 ENCOUNTER — Ambulatory Visit (INDEPENDENT_AMBULATORY_CARE_PROVIDER_SITE_OTHER): Payer: Managed Care, Other (non HMO) | Admitting: Nurse Practitioner

## 2023-04-04 VITALS — BP 144/74 | HR 81 | Temp 97.5°F | Ht 62.0 in | Wt 142.2 lb

## 2023-04-04 DIAGNOSIS — E663 Overweight: Secondary | ICD-10-CM

## 2023-04-04 DIAGNOSIS — Z79899 Other long term (current) drug therapy: Secondary | ICD-10-CM

## 2023-04-04 DIAGNOSIS — K219 Gastro-esophageal reflux disease without esophagitis: Secondary | ICD-10-CM

## 2023-04-04 DIAGNOSIS — R7309 Other abnormal glucose: Secondary | ICD-10-CM

## 2023-04-04 DIAGNOSIS — I700 Atherosclerosis of aorta: Secondary | ICD-10-CM

## 2023-04-04 DIAGNOSIS — R634 Abnormal weight loss: Secondary | ICD-10-CM

## 2023-04-04 DIAGNOSIS — J302 Other seasonal allergic rhinitis: Secondary | ICD-10-CM

## 2023-04-04 DIAGNOSIS — F330 Major depressive disorder, recurrent, mild: Secondary | ICD-10-CM

## 2023-04-04 DIAGNOSIS — I10 Essential (primary) hypertension: Secondary | ICD-10-CM

## 2023-04-04 DIAGNOSIS — M79674 Pain in right toe(s): Secondary | ICD-10-CM

## 2023-04-04 DIAGNOSIS — E782 Mixed hyperlipidemia: Secondary | ICD-10-CM

## 2023-04-04 DIAGNOSIS — E559 Vitamin D deficiency, unspecified: Secondary | ICD-10-CM

## 2023-04-04 DIAGNOSIS — K760 Fatty (change of) liver, not elsewhere classified: Secondary | ICD-10-CM

## 2023-04-04 DIAGNOSIS — F419 Anxiety disorder, unspecified: Secondary | ICD-10-CM

## 2023-04-04 MED ORDER — FLUTICASONE PROPIONATE 50 MCG/ACT NA SUSP
NASAL | 2 refills | Status: AC
Start: 2023-04-04 — End: ?

## 2023-04-04 MED ORDER — ESCITALOPRAM OXALATE 20 MG PO TABS: ORAL_TABLET | ORAL | 3 refills | Status: DC

## 2023-04-04 MED ORDER — BUSPIRONE HCL 5 MG PO TABS: 5.0000 mg | ORAL_TABLET | Freq: Three times a day (TID) | ORAL | 2 refills | Status: AC

## 2023-04-04 NOTE — Patient Instructions (Addendum)
Right great toe xray has been ordered at [facility] [address] [place] In M-F 8:30-3:45   Pick up lexapro and restart today  Managing Depression, Adult Depression is a mental health condition that affects your thoughts, feelings, and actions. Being diagnosed with depression can bring you relief if you did not know why you have felt or behaved a certain way. It could also leave you feeling overwhelmed. Finding ways to manage your symptoms can help you feel more positive about your future. How to manage lifestyle changes Being depressed is difficult. Depression can increase the level of everyday stress. Stress can make depression symptoms worse. You may believe your symptoms cannot be managed or will never improve. However, there are many things you can try to help manage your symptoms. There is hope. Managing stress  Stress is your body's reaction to life changes and events, both good and bad. Stress can add to your feelings of depression. Learning to manage your stress can help lessen your feelings of depression. Try some of the following approaches to reducing your stress (stress reduction techniques): Listen to music that you enjoy and that inspires you. Try using a meditation app or take a meditation class. Develop a practice that helps you connect with your spiritual self. Walk in nature, pray, or go to a place of worship. Practice deep breathing. To do this, inhale slowly through your nose. Pause at the top of your inhale for a few seconds and then exhale slowly, letting yourself relax. Repeat this three or four times. Practice yoga to help relax and work your muscles. Choose a stress reduction technique that works for you. These techniques take time and practice to develop. Set aside 5-15 minutes a day to do them. Therapists can offer training in these techniques. Do these things to help manage stress: Keep a journal. Know your limits. Set healthy boundaries for  yourself and others, such as saying "no" when you think something is too much. Pay attention to how you react to certain situations. You may not be able to control everything, but you can change your reaction. Add humor to your life by watching funny movies or shows. Make time for activities that you enjoy and that relax you. Spend less time using electronics, especially at night before bed. The light from screens can make your brain think it is time to get up rather than go to bed.  Medicines Medicines, such as antidepressants, are often a part of treatment for depression. Talk with your pharmacist or health care provider about all the medicines, supplements, and herbal products that you take, their possible side effects, and what medicines and other products are safe to take together. Make sure to report any side effects you may have to your health care provider. Relationships Your health care provider may suggest family therapy, couples therapy, or individual therapy as part of your treatment. How to recognize changes Everyone responds differently to treatment for depression. As you recover from depression, you may start to: Have more interest in doing activities. Feel more hopeful. Have more energy. Eat a more regular amount of food. Have better mental focus. It is important to recognize if your depression is not getting better or is getting worse. The symptoms you had in the beginning may return, such as: Feeling tired. Eating too much or too little. Sleeping too much or too little. Feeling restless, agitated, or hopeless. Trouble focusing or making decisions. Having unexplained aches and pains. Feeling irritable, angry,  or aggressive. If you or your family members notice these symptoms coming back, let your health care provider know right away. Follow these instructions at home: Activity Try to get some form of exercise each day, such as walking. Try yoga, mindfulness, or other  stress reduction techniques. Participate in group activities if you are able. Lifestyle Get enough sleep. Cut down on or stop using caffeine, tobacco, alcohol, and any other harmful substances. Eat a healthy diet that includes plenty of vegetables, fruits, whole grains, low-fat dairy products, and lean protein. Limit foods that are high in solid fats, added sugar, or salt (sodium). General instructions Take over-the-counter and prescription medicines only as told by your health care provider. Keep all follow-up visits. It is important for your health care provider to check on your mood, behavior, and medicines. Your health care provider may need to make changes to your treatment. Where to find support Talking to others  Friends and family members can be sources of support and guidance. Talk to trusted friends or family members about your condition. Explain your symptoms and let them know that you are working with a health care provider to treat your depression. Tell friends and family how they can help. Finances Find mental health providers that fit with your financial situation. Talk with your health care provider if you are worried about access to food, housing, or medicine. Call your insurance company to learn about your co-pays and prescription plan. Where to find more information You can find support in your area from: Anxiety and Depression Association of America (ADAA): adaa.org Mental Health America: mentalhealthamerica.net [employer] on Mental Illness: nami.org Contact a health care provider if: You stop taking your antidepressant medicines, and you have any of these symptoms: Nausea. Headache. Light-headedness. Chills and body aches. Not being able to sleep (insomnia). You or your friends and family think your depression is getting worse. Get help right away if: You have thoughts of hurting yourself or others. Get help right away if you feel like you may hurt yourself  or others, or have thoughts about taking your own life. Go to your nearest emergency room or: Call 911. Call the National Suicide Prevention Lifeline at 503-651-0203 or 988. This is open 24 hours a day. Text the Crisis Text Line at (769) 274-6773. This information is not intended to replace advice given to you by your health care provider. Make sure you discuss any questions you have with your health care provider. Document Revised: 04/04/2022 Document Reviewed: 04/04/2022 Elsevier Patient Education  2023 Elsevier Inc.  Protein-Energy Malnutrition Protein-energy malnutrition is when a person does not eat enough protein, fat, and calories. When this happens over time, it can lead to severe loss of muscle tissue (muscle wasting). This condition also affects the body's defense system (immune system) and can lead to other health problems. What are the causes? This condition may be caused by: Not eating enough protein, fat, or calories. Having certain chronic medical conditions. Eating too little. What increases the risk? The following factors may make you more likely to develop this condition: Living in poverty. Long-term hospitalization. Alcohol or drug dependency. Addiction often leads to a lifestyle in which proper diet is ignored. Dependency can also hurt the metabolism and the body's ability to absorb nutrients. Eating disorders, such as anorexia nervosa or bulimia. Chewing or swallowing problems. People with these disorders may not eat enough. Having certain conditions, such as: Inflammatory bowel disease. Inflammation of the intestines makes it difficult for the body to absorb  nutrients. Cancer or AIDS. These diseases can cause a loss of appetite. Chronic heart failure. This interferes with how the body uses nutrients. Cystic fibrosis. This disease can make it difficult for the body to absorb nutrients. Eating a diet that extremely restricts protein, fat, or calorie intake. What are the  signs or symptoms? Symptoms of this condition include: Tiredness (fatigue). Weakness. Dizziness. Fainting. Weight loss. Loss of muscle tone and muscle mass. Poor immune response. Lack of menstruation. Poor memory. Hair loss. Skin changes. How is this diagnosed? This condition may be diagnosed based on: Your medical and dietary history. A physical exam. This may include a measurement of your body mass index. Blood tests. How is this treated? This condition may be managed with: Nutrition therapy. This may include working with a Data processing manager. Treatment for underlying conditions. People with severe protein-energy malnutrition may need to be treated in a hospital. This may involve receiving nutrition and fluids through an IV. Follow these instructions at home:  Eat a balanced diet. In each meal, include at least one food that is high in protein. Foods that are high in protein include: Meat. Poultry. Fish. Eggs. Cheese. Milk. Beans. Nuts. Eat nutrient-rich foods that are easy to swallow and digest, such as: Fruit and yogurt smoothies. Oatmeal with nut butter. Nutrition supplement drinks. Try to eat six small meals each day instead of three large meals. Take vitamin and protein supplements as told by your health care provider or dietitian. Follow your health care provider's recommendations about exercise and activity. Keep all follow-up visits. This is important. Contact a health care provider if: You have increased weakness or fatigue. You faint. You are a woman and you stop having your period (menstruating). You have rapid hair loss. You have unexpected weight loss. You have diarrhea. You have nausea and vomiting. Get help right away if: You have difficulty breathing. You have chest pain. These symptoms may represent a serious problem that is an emergency. Do not wait to see if the symptoms will go away. Get medical help right away. Call your local emergency services (911  in the [place] Do not drive yourself to the hospital. Summary Protein-energy malnutrition is when a person does not eat enough protein, fat, and calories. Protein-energy malnutrition can lead to severe loss of muscle tissue (muscle wasting). This condition also affects the body's defense system (immune system) and can lead to other health problems. Talk with your health care provider about treatment for this condition. Effective treatment depends on the underlying cause of the malnutrition. This information is not intended to replace advice given to you by your health care provider. Make sure you discuss any questions you have with your health care provider. Document Revised: 11/27/2020 Document Reviewed: 11/27/2020 Elsevier Patient Education  2023 [employer].

## 2023-04-05 LAB — CBC WITH DIFFERENTIAL/PLATELET
Absolute Monocytes: 217 cells/uL (ref 200–950)
Basophils Absolute: 9 cells/uL (ref 0–200)
Basophils Relative: 0.3 %
Eosinophils Absolute: 81 cells/uL (ref 15–500)
Eosinophils Relative: 2.6 %
HCT: 38.9 % (ref 35.0–45.0)
Hemoglobin: 13.5 g/dL (ref 11.7–15.5)
Lymphs Abs: 1228 cells/uL (ref 850–3900)
MCH: 33.3 pg — ABNORMAL HIGH (ref 27.0–33.0)
MCHC: 34.7 g/dL (ref 32.0–36.0)
MCV: 96 fL (ref 80.0–100.0)
MPV: 10 fL (ref 7.5–12.5)
Monocytes Relative: 7 %
Neutro Abs: 1566 cells/uL (ref 1500–7800)
Neutrophils Relative %: 50.5 %
Platelets: 177 10*3/uL (ref 140–400)
RBC: 4.05 10*6/uL (ref 3.80–5.10)
RDW: 12.8 % (ref 11.0–15.0)
Total Lymphocyte: 39.6 %
WBC: 3.1 10*3/uL — ABNORMAL LOW (ref 3.8–10.8)

## 2023-04-05 LAB — COMPLETE METABOLIC PANEL WITH GFR
AG Ratio: 2.2 (calc) (ref 1.0–2.5)
ALT: 15 U/L (ref 6–29)
AST: 15 U/L (ref 10–35)
Albumin: 4.3 g/dL (ref 3.6–5.1)
Alkaline phosphatase (APISO): 76 U/L (ref 37–153)
BUN: 14 mg/dL (ref 7–25)
CO2: 29 mmol/L (ref 20–32)
Calcium: 9.7 mg/dL (ref 8.6–10.4)
Chloride: 107 mmol/L (ref 98–110)
Creat: 0.88 mg/dL (ref 0.50–1.05)
Globulin: 2 g/dL (calc) (ref 1.9–3.7)
Glucose, Bld: 116 mg/dL — ABNORMAL HIGH (ref 65–99)
Potassium: 3.5 mmol/L (ref 3.5–5.3)
Sodium: 145 mmol/L (ref 135–146)
Total Bilirubin: 0.4 mg/dL (ref 0.2–1.2)
Total Protein: 6.3 g/dL (ref 6.1–8.1)
eGFR: 73 mL/min/{1.73_m2} (ref >=60)

## 2023-04-05 LAB — LIPID PANEL
Cholesterol: 153 mg/dL (ref <200)
HDL: 55 mg/dL (ref >=50)
LDL Cholesterol (Calc): 80 mg/dL (calc)
Non-HDL Cholesterol (Calc): 98 mg/dL (calc) (ref <130)
Total CHOL/HDL Ratio: 2.8 (calc) (ref <5.0)
Triglycerides: 99 mg/dL (ref <150)

## 2023-04-05 LAB — TSH: TSH: 0.64 mIU/L (ref 0.40–4.50)

## 2023-04-05 LAB — HEMOGLOBIN A1C
Hgb A1c MFr Bld: 5.8 % of total Hgb — ABNORMAL HIGH (ref <5.7)
Mean Plasma Glucose: 120 mg/dL
eAG (mmol/L): 6.6 mmol/L

## 2023-04-12 ENCOUNTER — Ambulatory Visit: Payer: Managed Care, Other (non HMO) | Admitting: Nurse Practitioner

## 2023-05-28 ENCOUNTER — Other Ambulatory Visit: Payer: Self-pay

## 2023-05-28 DIAGNOSIS — E782 Mixed hyperlipidemia: Secondary | ICD-10-CM

## 2023-05-28 MED ORDER — ROSUVASTATIN CALCIUM 40 MG PO TABS
ORAL_TABLET | ORAL | 3 refills | Status: DC
Start: 2023-05-28 — End: 2023-11-19

## 2023-05-28 MED ORDER — OMEPRAZOLE 20 MG PO CPDR: DELAYED_RELEASE_CAPSULE | ORAL | 3 refills | Status: DC

## 2023-05-28 NOTE — Addendum Note (Signed)
Addended by: Dionicio Stall on: 05/28/2023 12:03 PM   Modules accepted: Orders
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
## 2023-07-09 ENCOUNTER — Ambulatory Visit (INDEPENDENT_AMBULATORY_CARE_PROVIDER_SITE_OTHER): Payer: Managed Care, Other (non HMO) | Admitting: Nurse Practitioner

## 2023-07-09 ENCOUNTER — Encounter: Payer: Self-pay | Admitting: Nurse Practitioner

## 2023-07-09 VITALS — BP 142/72 | HR 68 | Temp 97.5°F | Ht 62.0 in | Wt 137.4 lb

## 2023-07-09 DIAGNOSIS — I10 Essential (primary) hypertension: Secondary | ICD-10-CM | POA: Diagnosis not present

## 2023-07-09 DIAGNOSIS — K219 Gastro-esophageal reflux disease without esophagitis: Secondary | ICD-10-CM

## 2023-07-09 DIAGNOSIS — M25561 Pain in right knee: Secondary | ICD-10-CM

## 2023-07-09 DIAGNOSIS — R7309 Other abnormal glucose: Secondary | ICD-10-CM | POA: Diagnosis not present

## 2023-07-09 DIAGNOSIS — F330 Major depressive disorder, recurrent, mild: Secondary | ICD-10-CM

## 2023-07-09 DIAGNOSIS — F419 Anxiety disorder, unspecified: Secondary | ICD-10-CM

## 2023-07-09 DIAGNOSIS — R634 Abnormal weight loss: Secondary | ICD-10-CM

## 2023-07-09 DIAGNOSIS — G4700 Insomnia, unspecified: Secondary | ICD-10-CM

## 2023-07-09 DIAGNOSIS — E782 Mixed hyperlipidemia: Secondary | ICD-10-CM

## 2023-07-09 DIAGNOSIS — E559 Vitamin D deficiency, unspecified: Secondary | ICD-10-CM

## 2023-07-09 DIAGNOSIS — Z79899 Other long term (current) drug therapy: Secondary | ICD-10-CM

## 2023-07-09 DIAGNOSIS — I700 Atherosclerosis of aorta: Secondary | ICD-10-CM

## 2023-07-09 DIAGNOSIS — M799 Soft tissue disorder, unspecified: Secondary | ICD-10-CM

## 2023-07-09 DIAGNOSIS — K760 Fatty (change of) liver, not elsewhere classified: Secondary | ICD-10-CM

## 2023-07-09 LAB — CBC WITH DIFFERENTIAL/PLATELET
Absolute Monocytes: 284 cells/uL (ref 200–950)
Basophils Absolute: 20 cells/uL (ref 0–200)
Basophils Relative: 0.7 %
Eosinophils Absolute: 102 cells/uL (ref 15–500)
Eosinophils Relative: 3.5 %
HCT: 40.4 % (ref 35.0–45.0)
Hemoglobin: 13.4 g/dL (ref 11.7–15.5)
Lymphs Abs: 1572 cells/uL (ref 850–3900)
MCH: 32.9 pg (ref 27.0–33.0)
MCHC: 33.2 g/dL (ref 32.0–36.0)
MCV: 99.3 fL (ref 80.0–100.0)
MPV: 9.9 fL (ref 7.5–12.5)
Monocytes Relative: 9.8 %
Neutro Abs: 922 cells/uL — ABNORMAL LOW (ref 1500–7800)
Neutrophils Relative %: 31.8 %
Platelets: 169 10*3/uL (ref 140–400)
RBC: 4.07 10*6/uL (ref 3.80–5.10)
RDW: 11.8 % (ref 11.0–15.0)
Total Lymphocyte: 54.2 %
WBC: 2.9 10*3/uL — ABNORMAL LOW (ref 3.8–10.8)

## 2023-07-09 NOTE — Patient Instructions (Signed)

## 2023-08-22 ENCOUNTER — Other Ambulatory Visit: Payer: Self-pay | Admitting: Nurse Practitioner

## 2023-08-22 DIAGNOSIS — I10 Essential (primary) hypertension: Secondary | ICD-10-CM

## 2023-09-26 ENCOUNTER — Encounter: Payer: Managed Care, Other (non HMO) | Admitting: Nurse Practitioner

## 2023-10-04 NOTE — Progress Notes (Deleted)
 Complete Physical  Assessment and Plan: Breslynn was seen today for annual exam.  Diagnoses and all orders for this visit:  Encounter for routine adult health examination without abnormal findings  Due Annually  Will call to schedule mammogram  Essential hypertension -     CBC with Differential/Platelet -     Urinalysis, Routine w reflex microscopic -     Microalbumin / creatinine urine ratio -     metoprolol succinate (TOPROL-XL) 25 MG 24 hr tablet; Take 1 tablet (25 mg total) by mouth daily. Adding back to regimen. She stopped but was not taken off by provider and BP is elevated.  Discussed importance of taking all 3 meds. - - continue medications, DASH diet, exercise and monitor at home. Call if greater than 130/80.   - Go to the ER if any chest pain, shortness of breath, nausea, dizziness, severe HA, changes vision/speech   Hyperlipidemia, mixed -     COMPLETE METABOLIC PANEL WITH GFR -     Lipid panel -     TSH  Continue medication, low saturated fat diet and increase exercise  Mild episode of recurrent major depressive disorder (HCC)  Monitor symptoms  Continue Lexapro and behavior modifications  Gastroesophageal reflux disease, unspecified whether esophagitis present -     Magnesium Continue Prilosec and behavior modifications  Hepatic steatosis  CMP  Continue diet and exercise  Vitamin D deficiency -     VITAMIN D 25 Hydroxy (Vit-D Deficiency, Fractures) Continue Vit D supplementation  Medication management -     CBC with Differential/Platelet -     COMPLETE METABOLIC PANEL WITH GFR -     Lipid panel -     TSH -     Hemoglobin A1c -     VITAMIN D 25 Hydroxy (Vit-D Deficiency, Fractures) -     Magnesium  Allergic Rhinitis Pt to switch to Allegra, Zyrtec not helping as much Use Albuterol inhaler as needed for wheezing  Abnormal glucose Continue diet and exercise -     Hemoglobin A1c  Screening for hematuria/proteinuria Routine UA with reflex  microscopic Microablumin/creatinine urine ratio  History of CVA - Uncertain when , old infart found in right occipital on MRI at ER after L lower quadrianopsia was - Continue low dose ASA daily and monitor for symptoms Control  BP, Cholesterol, Blood sugar and weight  Screening for thyroid disorder - TSH  Screening for ischemic heart disease -     EKG 12-Lead  Screening for AAA - U/S ABD Retroperitoneal LTD  Colon cancer screening -     Ambulatory referral to Gastroenterology***     Future Appointments  Date Time Provider Department Center  10/08/2023  9:00 AM Raynelle Dick, NP [facility] None  10/07/2024  9:00 AM Raynelle Dick, NP [facility] None     HPI  67 y.o. female  presents for a complete physical. She has Major depression, recurrent (HCC); Esophageal reflux; Vitamin D deficiency; Hypertension; Hyperlipemia; Medication management; Overweight (BMI 25.0-29.9); Other abnormal glucose (prediabetes); Urge incontinence; Hepatic steatosis; Aortic atherosclerosis (HCC); Soft tissue lesion of pelvic region; Abnormal CT scan, pelvis; Insomnia; and Lumbar pain on their problem list.   She is divorced, works at [employer], no children.   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 Was seen in ER 07/12/22: Leela Crackel is a 67 y.o. 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 This was incidentally noted on ophtho exam earlier  today. She has no change in vision. Nonfocal neuro exam. Will get MRI to r/o mass vs stroke. 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 I talked to Dr. Leeroy Bock from neuro. He reviewed images and recommend ASA 81 mg daily and neuro follow up outpatient.    Problems Addressed: Chronic arterial ischemic stroke: acute illness or injury   Allergy symptoms are currently well controlled.   She is on lexapro 20 mg QD for  recurrent depression and Buspar for worrying/anxiety and feels this is helpful.   Hx of GERD/stricture/dysphagia, taking omeprazole daily without breakthrough.    BMI is There is no height or weight on file to calculate BMI., she has not been working on diet and exercise.  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  She drinks mainly water, drinks 2 bottles of 20 oz 1 cup of coffee daily  She had [place] 12/2019 which showed diffuse hepatosteatosis   Wt Readings from Last 3 Encounters:  07/09/23 137 lb 6.4 oz (62.3 kg)  04/04/23 142 lb 3.2 oz (64.5 kg)  09/25/22 154 lb 3.2 oz (69.9 kg)   She has not been checking BPs at home, today their BP is  .   BP Readings from Last 3 Encounters:  07/09/23 (!) 142/72  04/04/23 (!) 144/74  09/25/22 138/80   Taking Olmesartan 40 mg, and Amlodipine 5 mg QD.    She does not workout. She denies chest pain, shortness of breath, dizziness.   She is on cholesterol medication (rosuvastatin 40 mg daily) and denies myalgias. Her cholesterol is at goal. The cholesterol last visit was:  Lab Results  Component Value Date   CHOL 168 07/09/2023   HDL 67 07/09/2023   LDLCALC 78 07/09/2023   TRIG 125 07/09/2023   CHOLHDL 2.5 07/09/2023  . She has not been working on diet and exercise for prediabetes, and denies foot ulcerations, hyperglycemia, hypoglycemia , increased appetite, nausea, paresthesia of the feet, polydipsia, polyuria, visual disturbances, vomiting and weight loss. Last A1C in the office was:  Lab Results  Component Value Date   HGBA1C 5.8 (H) 04/04/2023   She has CKD IIIa associated with htn; on ARB; last GFR:  Lab Results  Component Value Date   EGFR 74 07/09/2023    Patient is on Vitamin D supplement, taking 16109 IU daily   Lab Results  Component Value Date   VD25OH 118 (H) 09/25/2022      Current Medications:  Current Outpatient Medications on File Prior to Visit  Medication Sig Dispense Refill   albuterol (VENTOLIN HFA)  108 (90 Base) MCG/ACT inhaler Inhale 2 puffs into the lungs every 4 (four) hours as needed for wheezing or shortness of breath. 1 each 0   amLODipine (NORVASC) 5 MG tablet TAKE ONE TABLET BY MOUTH DAILY 90 tablet 3   aspirin 81 MG chewable tablet Chew 1 tablet (81 mg total) by mouth daily. 30 tablet 0   Biotin 300 MCG TABS Take 1 tablet by mouth at bedtime.     busPIRone (BUSPAR) 5 MG tablet Take 1 tablet (5 mg total) by mouth 3 (three) times daily. 90 tablet 2   cetirizine (ZYRTEC ALLERGY) 10 MG tablet Take 1 tablet (10 mg total) by mouth daily.     CHOLECALCIFEROL PO Take 10,000 Units by mouth daily. Takes 15,000 units     escitalopram (LEXAPRO) 20 MG  tablet TAKE ONE TABLET BY MOUTH DAILY FOR MOOD 90 tablet 3   Fexofenadine HCl (ALLEGRA PO) Take by mouth.     fluticasone (FLONASE) 50 MCG/ACT nasal spray SPRAY TWO SPRAYS IN EACH NOSTRIL ONCE DAILY 16 g 2   latanoprost (XALATAN) 0.005 % ophthalmic solution Place 1 drop into both eyes at bedtime.     Magnesium 250 MG TABS Take 1 tablet by mouth 2 (two) times daily.      olmesartan (BENICAR) 40 MG tablet TAKE 1 TABLET BY MOUTH DAILY 90 tablet 1   omeprazole (PRILOSEC) 20 MG capsule TAKE ONE CAPSULE BY MOUTH EVERY NIGHT AT BEDTIME FOR ACID INDIGESTION AND REFLUX 90 capsule 3   rosuvastatin (CRESTOR) 40 MG tablet TAKE ONE TABLET BY MOUTH DAILY FOR CHOLESTEROL 90 tablet 3   traZODone (DESYREL) 50 MG tablet Take  1 tablet  1 hour before Bedtime  as needed for Sleep                                                 /                                          TAKE                                          BY                                      MOUTH 90 tablet 3   No current facility-administered medications on file prior to visit.    Health Maintenance:   Immunization History  Administered Date(s) Administered   Influenza Inj Mdck Quad With Preservative 09/09/2020   Influenza Split 08/30/2015   Influenza,inj,Quad PF,6+ Mos 08/09/2019    Influenza-Unspecified 10/09/2017   PFIZER(Purple Top)SARS-COV-2 Vaccination 03/12/2020, 04/02/2020   Pneumococcal-Unspecified 12/11/2008   Td 12/11/2005   Tdap 12/30/2015   Zoster Recombinant(Shingrix) 04/26/2017, 07/25/2017   Tetanus: 2017 Pneumovax: 2010, due age 74 Flu vaccine: 09/09/20 Shingrix: 2018 Covid 19: 2/2, pfizer   LMP: postmenopausal Pap: 2018 neg, HPV neg, due 2021-2023 - defer today, insufficient time  MGM:11/2019- over due plans to schedule DEXA: get at age 47  Colonoscopy: 02/2010, Dr Arlyce Dice - over due plans to schedule EGD: 2015  Last Dental Exam: 2021, goes q76m Last Eye Exam: 2020, goes annually, glasses  Patient Care Team: Lucky Cowboy, MD as PCP - General (Internal Medicine) Louis Meckel, MD (Inactive) as Consulting Physician (Gastroenterology) Shea Evans, MD as Consulting Physician (Obstetrics and Gynecology) Nadara Mustard, MD as Consulting Physician (Orthopedic Surgery)  Medical History:  Past Medical History:  Diagnosis Date   Allergy    Anxiety    Depression    Esophageal stricture    GERD (gastroesophageal reflux disease)    History of COVID-19 02/23/2020   Hyperlipemia    Hypertension    Unspecified vitamin D deficiency    Allergies Allergies  Allergen Reactions   Ppd [Tuberculin Purified Protein Derivative]     + PPD with NEG CXR 1/ 2014  Clinoril [Sulindac] Rash    SURGICAL HISTORY She  has a past surgical history that includes Cholecystectomy; Esophageal dilation (2009); and LEEP (2004). FAMILY HISTORY Her family history includes Breast cancer (age of onset: 36) in her sister; Diabetes in her sister; Glaucoma in her mother and sister; Hyperlipidemia in her mother; Hypertension in her brother, mother, and sister; Stroke (age of onset: 63) in her mother. SOCIAL HISTORY She  reports that she quit smoking about 21 years ago. Her smoking use included cigarettes. She started smoking about 24 years ago. She has a 1.5 pack-year  smoking history. She has never used smokeless tobacco. She reports that she does not drink alcohol and does not use drugs.  Review of Systems: Review of Systems  Constitutional:  Negative for chills, diaphoresis, fever and malaise/fatigue.  HENT:  Negative for congestion, ear pain, hearing loss, sinus pain, sore throat and tinnitus.   Eyes: Negative.  Negative for blurred vision and double vision.       Some peripheral vision loss in left eye  Respiratory:  Negative for cough, hemoptysis, sputum production, shortness of breath and wheezing.   Cardiovascular:  Negative for chest pain, palpitations and leg swelling.  Gastrointestinal:  Negative for abdominal pain, blood in stool, constipation, diarrhea, heartburn, melena, nausea and vomiting.  Genitourinary: Negative.  Negative for dysuria and urgency.       Urge incontinence, wears 1-2 pads per day  Musculoskeletal:  Negative for back pain, falls, joint pain, myalgias and neck pain.  Skin: Negative.  Negative for rash.  Neurological:  Negative for dizziness, tingling, tremors, sensory change, loss of consciousness, weakness and headaches.  Endo/Heme/Allergies:  Does not bruise/bleed easily.  Psychiatric/Behavioral:  Negative for depression, substance abuse and suicidal ideas. The patient is not nervous/anxious and does not have insomnia.     Physical Exam: Estimated body mass index is 25.13 kg/m as calculated from the following:   Height as of 07/09/23: 5\' 2"  (1.575 m).   Weight as of 07/09/23: 137 lb 6.4 oz (62.3 kg). There were no vitals taken for this visit.  General Appearance: Well nourished well developed, in no apparent distress.  Eyes: PERRLA, EOMs, conjunctiva no swelling or erythema. Pigment changes right sclera ENT/Mouth: Ear canals normal without obstruction, swelling, erythema, or discharge.  TMs normal bilaterally with no erythema, bulging, retraction, or loss of landmark.  Oropharynx moist and clear with no exudate, erythema,  or swelling.   Neck: Supple, thyroid normal. No bruits.  No cervical adenopathy Respiratory: Respiratory effort normal, Breath sounds clear A&P without wheeze, rhonchi, rales.   Cardio: RRR without murmurs, rubs or gallops. Brisk peripheral pulses without edema.  Chest: symmetric, with normal excursions Breasts: Symmetric, without lumps, nipple discharge, retractions.  Abdomen: Soft, nontender, no guarding, rebound, hernias, masses, or organomegaly.  Lymphatics: Non tender without lymphadenopathy.  Genitourinary: defer  Musculoskeletal: Full ROM all peripheral extremities,5/5 strength, and normal gait.  Skin: Warm, dry without rashes, lesions, ecchymosis. Neuro: Awake and oriented X 3, Cranial nerves intact, reflexes equal bilaterally. Normal muscle tone, no cerebellar symptoms. Sensation intact.  Psych:  normal affect, Insight and Judgment appropriate.   EKG: NSR, no ST changes  Over 40 minutes of exam, counseling, chart review and critical decision making was performed  Nayvie Lips E  10:10 AM [facility]

## 2023-10-08 ENCOUNTER — Encounter: Payer: Managed Care, Other (non HMO) | Admitting: Nurse Practitioner

## 2023-10-08 DIAGNOSIS — F419 Anxiety disorder, unspecified: Secondary | ICD-10-CM

## 2023-10-08 DIAGNOSIS — Z1329 Encounter for screening for other suspected endocrine disorder: Secondary | ICD-10-CM

## 2023-10-08 DIAGNOSIS — J302 Other seasonal allergic rhinitis: Secondary | ICD-10-CM

## 2023-10-08 DIAGNOSIS — F330 Major depressive disorder, recurrent, mild: Secondary | ICD-10-CM

## 2023-10-08 DIAGNOSIS — E663 Overweight: Secondary | ICD-10-CM

## 2023-10-08 DIAGNOSIS — R7309 Other abnormal glucose: Secondary | ICD-10-CM

## 2023-10-08 DIAGNOSIS — I10 Essential (primary) hypertension: Secondary | ICD-10-CM

## 2023-10-08 DIAGNOSIS — Z79899 Other long term (current) drug therapy: Secondary | ICD-10-CM

## 2023-10-08 DIAGNOSIS — K760 Fatty (change of) liver, not elsewhere classified: Secondary | ICD-10-CM

## 2023-10-08 DIAGNOSIS — K219 Gastro-esophageal reflux disease without esophagitis: Secondary | ICD-10-CM

## 2023-10-08 DIAGNOSIS — Z8673 Personal history of transient ischemic attack (TIA), and cerebral infarction without residual deficits: Secondary | ICD-10-CM

## 2023-10-08 DIAGNOSIS — Z0001 Encounter for general adult medical examination with abnormal findings: Secondary | ICD-10-CM

## 2023-10-08 DIAGNOSIS — E782 Mixed hyperlipidemia: Secondary | ICD-10-CM

## 2023-10-08 DIAGNOSIS — Z136 Encounter for screening for cardiovascular disorders: Secondary | ICD-10-CM

## 2023-10-08 DIAGNOSIS — I700 Atherosclerosis of aorta: Secondary | ICD-10-CM

## 2023-10-08 DIAGNOSIS — Z1389 Encounter for screening for other disorder: Secondary | ICD-10-CM

## 2023-10-08 DIAGNOSIS — E559 Vitamin D deficiency, unspecified: Secondary | ICD-10-CM

## 2023-10-09 ENCOUNTER — Encounter: Payer: Managed Care, Other (non HMO) | Admitting: Nurse Practitioner
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
## 2023-10-22 ENCOUNTER — Other Ambulatory Visit: Payer: Self-pay | Admitting: Nurse Practitioner

## 2023-10-22 ENCOUNTER — Ambulatory Visit (INDEPENDENT_AMBULATORY_CARE_PROVIDER_SITE_OTHER): Payer: Managed Care, Other (non HMO) | Admitting: Nurse Practitioner

## 2023-10-22 ENCOUNTER — Encounter: Payer: Self-pay | Admitting: Nurse Practitioner

## 2023-10-22 VITALS — BP 130/72 | HR 61 | Temp 97.5°F | Ht 62.0 in | Wt 139.2 lb

## 2023-10-22 DIAGNOSIS — Z136 Encounter for screening for cardiovascular disorders: Secondary | ICD-10-CM | POA: Diagnosis not present

## 2023-10-22 DIAGNOSIS — K219 Gastro-esophageal reflux disease without esophagitis: Secondary | ICD-10-CM

## 2023-10-22 DIAGNOSIS — K760 Fatty (change of) liver, not elsewhere classified: Secondary | ICD-10-CM

## 2023-10-22 DIAGNOSIS — R8290 Unspecified abnormal findings in urine: Secondary | ICD-10-CM

## 2023-10-22 DIAGNOSIS — Z1329 Encounter for screening for other suspected endocrine disorder: Secondary | ICD-10-CM

## 2023-10-22 DIAGNOSIS — I10 Essential (primary) hypertension: Secondary | ICD-10-CM | POA: Diagnosis not present

## 2023-10-22 DIAGNOSIS — Z124 Encounter for screening for malignant neoplasm of cervix: Secondary | ICD-10-CM

## 2023-10-22 DIAGNOSIS — I700 Atherosclerosis of aorta: Secondary | ICD-10-CM

## 2023-10-22 DIAGNOSIS — F330 Major depressive disorder, recurrent, mild: Secondary | ICD-10-CM

## 2023-10-22 DIAGNOSIS — G4700 Insomnia, unspecified: Secondary | ICD-10-CM

## 2023-10-22 DIAGNOSIS — Z131 Encounter for screening for diabetes mellitus: Secondary | ICD-10-CM | POA: Diagnosis not present

## 2023-10-22 DIAGNOSIS — E559 Vitamin D deficiency, unspecified: Secondary | ICD-10-CM | POA: Diagnosis not present

## 2023-10-22 DIAGNOSIS — R7309 Other abnormal glucose: Secondary | ICD-10-CM

## 2023-10-22 DIAGNOSIS — Z79899 Other long term (current) drug therapy: Secondary | ICD-10-CM | POA: Diagnosis not present

## 2023-10-22 DIAGNOSIS — F419 Anxiety disorder, unspecified: Secondary | ICD-10-CM

## 2023-10-22 DIAGNOSIS — Z13220 Encounter for screening for lipoid disorders: Secondary | ICD-10-CM

## 2023-10-22 DIAGNOSIS — E2839 Other primary ovarian failure: Secondary | ICD-10-CM

## 2023-10-22 DIAGNOSIS — Z0001 Encounter for general adult medical examination with abnormal findings: Secondary | ICD-10-CM

## 2023-10-22 DIAGNOSIS — Z1389 Encounter for screening for other disorder: Secondary | ICD-10-CM

## 2023-10-22 DIAGNOSIS — Z0000 Encounter for general adult medical examination without abnormal findings: Secondary | ICD-10-CM

## 2023-10-22 DIAGNOSIS — Z1211 Encounter for screening for malignant neoplasm of colon: Secondary | ICD-10-CM

## 2023-10-22 DIAGNOSIS — Z8673 Personal history of transient ischemic attack (TIA), and cerebral infarction without residual deficits: Secondary | ICD-10-CM

## 2023-10-22 DIAGNOSIS — J302 Other seasonal allergic rhinitis: Secondary | ICD-10-CM

## 2023-10-22 DIAGNOSIS — E782 Mixed hyperlipidemia: Secondary | ICD-10-CM

## 2023-10-22 NOTE — Patient Instructions (Signed)

## 2023-10-23 ENCOUNTER — Other Ambulatory Visit: Payer: Self-pay | Admitting: Nurse Practitioner

## 2023-10-23 DIAGNOSIS — R8290 Unspecified abnormal findings in urine: Secondary | ICD-10-CM

## 2023-10-23 LAB — COMPLETE METABOLIC PANEL WITH GFR
AG Ratio: 2 (calc) (ref 1.0–2.5)
ALT: 31 U/L — ABNORMAL HIGH (ref 6–29)
AST: 27 U/L (ref 10–35)
Albumin: 4.6 g/dL (ref 3.6–5.1)
Alkaline phosphatase (APISO): 81 U/L (ref 37–153)
BUN: 12 mg/dL (ref 7–25)
CO2: 32 mmol/L (ref 20–32)
Calcium: 9.5 mg/dL (ref 8.6–10.4)
Chloride: 105 mmol/L (ref 98–110)
Creat: 0.91 mg/dL (ref 0.50–1.05)
Globulin: 2.3 g/dL (ref 1.9–3.7)
Glucose, Bld: 93 mg/dL (ref 65–99)
Potassium: 3.7 mmol/L (ref 3.5–5.3)
Sodium: 145 mmol/L (ref 135–146)
Total Bilirubin: 0.3 mg/dL (ref 0.2–1.2)
Total Protein: 6.9 g/dL (ref 6.1–8.1)
eGFR: 70 mL/min/{1.73_m2} (ref >=60)

## 2023-10-23 LAB — URINALYSIS, ROUTINE W REFLEX MICROSCOPIC
Bilirubin Urine: NEGATIVE
Glucose, UA: NEGATIVE
Hyaline Cast: NONE SEEN /[LPF]
Ketones, ur: NEGATIVE
Nitrite: POSITIVE — AB
Specific Gravity, Urine: 1.018 (ref 1.001–1.035)
Squamous Epithelial / HPF: NONE SEEN /[HPF] (ref <=5)
WBC, UA: >=60 /[HPF] — AB (ref 0–5)
pH: 7 (ref 5.0–8.0)

## 2023-10-23 LAB — HEMOGLOBIN A1C W/OUT EAG: Hgb A1c MFr Bld: 5.7 %{Hb} — ABNORMAL HIGH (ref <5.7)

## 2023-10-23 LAB — TSH: TSH: 1.85 m[IU]/L (ref 0.40–4.50)

## 2023-10-23 LAB — CBC WITH DIFFERENTIAL/PLATELET
Absolute Lymphocytes: 1484 {cells}/uL (ref 850–3900)
Absolute Monocytes: 308 {cells}/uL (ref 200–950)
Basophils Absolute: 8 {cells}/uL (ref 0–200)
Basophils Relative: 0.2 %
Eosinophils Absolute: 88 {cells}/uL (ref 15–500)
Eosinophils Relative: 2.2 %
HCT: 43.2 % (ref 35.0–45.0)
Hemoglobin: 14.7 g/dL (ref 11.7–15.5)
MCH: 33.2 pg — ABNORMAL HIGH (ref 27.0–33.0)
MCHC: 34 g/dL (ref 32.0–36.0)
MCV: 97.5 fL (ref 80.0–100.0)
MPV: 10.2 fL (ref 7.5–12.5)
Monocytes Relative: 7.7 %
Neutro Abs: 2112 {cells}/uL (ref 1500–7800)
Neutrophils Relative %: 52.8 %
Platelets: 166 10*3/uL (ref 140–400)
RBC: 4.43 10*6/uL (ref 3.80–5.10)
RDW: 12.4 % (ref 11.0–15.0)
Total Lymphocyte: 37.1 %
WBC: 4 10*3/uL (ref 3.8–10.8)

## 2023-10-23 LAB — MICROALBUMIN / CREATININE URINE RATIO
Creatinine, Urine: 134 mg/dL (ref 20–275)
Microalb Creat Ratio: 65 mg/g{creat} — ABNORMAL HIGH (ref <30)
Microalb, Ur: 8.7 mg/dL

## 2023-10-23 LAB — LIPID PANEL
Cholesterol: 176 mg/dL (ref <200)
HDL: 66 mg/dL (ref >=50)
LDL Cholesterol (Calc): 85 mg/dL
Non-HDL Cholesterol (Calc): 110 mg/dL (ref <130)
Total CHOL/HDL Ratio: 2.7 (calc) (ref <5.0)
Triglycerides: 145 mg/dL (ref <150)

## 2023-10-23 LAB — MAGNESIUM: Magnesium: 2 mg/dL (ref 1.5–2.5)

## 2023-10-23 LAB — VITAMIN D 25 HYDROXY (VIT D DEFICIENCY, FRACTURES): Vit D, 25-Hydroxy: 104 ng/mL — ABNORMAL HIGH (ref 30–100)

## 2023-10-23 LAB — MICROSCOPIC MESSAGE

## 2023-10-25 ENCOUNTER — Other Ambulatory Visit: Payer: Self-pay | Admitting: Nurse Practitioner

## 2023-10-25 DIAGNOSIS — R8290 Unspecified abnormal findings in urine: Secondary | ICD-10-CM

## 2023-10-25 LAB — PAP, TP IMAGING W/ HPV RNA, RFLX HPV TYPE 16,18/45: HPV DNA High Risk: NOT DETECTED

## 2023-10-25 LAB — URINE CULTURE
MICRO NUMBER:: 15719673
SPECIMEN QUALITY:: ADEQUATE

## 2023-10-25 LAB — PAP, TP IMAGING, WNL RFLX HPV

## 2023-10-25 MED ORDER — NITROFURANTOIN MONOHYD MACRO 100 MG PO CAPS: 100.0000 mg | ORAL_CAPSULE | Freq: Two times a day (BID) | ORAL | 0 refills | Status: AC

## 2023-11-19 ENCOUNTER — Encounter: Payer: Self-pay | Admitting: Nurse Practitioner

## 2023-11-19 ENCOUNTER — Encounter: Payer: Self-pay | Admitting: Internal Medicine

## 2023-11-19 ENCOUNTER — Ambulatory Visit (INDEPENDENT_AMBULATORY_CARE_PROVIDER_SITE_OTHER): Payer: Managed Care, Other (non HMO) | Admitting: Nurse Practitioner

## 2023-11-19 VITALS — BP 130/68 | HR 59 | Temp 97.7°F | Ht 62.0 in | Wt 140.0 lb

## 2023-11-19 DIAGNOSIS — K219 Gastro-esophageal reflux disease without esophagitis: Secondary | ICD-10-CM

## 2023-11-19 DIAGNOSIS — I10 Essential (primary) hypertension: Secondary | ICD-10-CM

## 2023-11-19 DIAGNOSIS — E782 Mixed hyperlipidemia: Secondary | ICD-10-CM

## 2023-11-19 DIAGNOSIS — F330 Major depressive disorder, recurrent, mild: Secondary | ICD-10-CM | POA: Diagnosis not present

## 2023-11-19 DIAGNOSIS — G4700 Insomnia, unspecified: Secondary | ICD-10-CM

## 2023-11-19 MED ORDER — TRAZODONE HCL 50 MG PO TABS: ORAL_TABLET | ORAL | 3 refills | Status: AC

## 2023-11-19 MED ORDER — AMLODIPINE BESYLATE 5 MG PO TABS: 5.0000 mg | ORAL_TABLET | Freq: Every day | ORAL | 3 refills | Status: AC

## 2023-11-19 MED ORDER — ROSUVASTATIN CALCIUM 40 MG PO TABS: ORAL_TABLET | ORAL | 3 refills | Status: AC

## 2023-11-19 MED ORDER — ESCITALOPRAM OXALATE 20 MG PO TABS: ORAL_TABLET | ORAL | 3 refills | Status: AC

## 2023-11-19 MED ORDER — BUPROPION HCL ER (XL) 150 MG PO TB24: 150.0000 mg | ORAL_TABLET | ORAL | 2 refills | Status: DC

## 2023-11-19 MED ORDER — OMEPRAZOLE 20 MG PO CPDR: DELAYED_RELEASE_CAPSULE | ORAL | 3 refills | Status: AC

## 2023-11-19 NOTE — Progress Notes (Signed)
Assessment and Plan: Kaitlyn [name] was seen today for follow-up.  Diagnoses and all orders for this visit:  Gastroesophageal reflux disease, unspecified whether esophagitis present Continue diet modifications and omeprazole daily -     omeprazole (PRILOSEC) 20 MG capsule; TAKE ONE CAPSULE BY MOUTH EVERY NIGHT AT BEDTIME FOR ACID INDIGESTION AND REFLUX  Essential hypertension - continue medications: amlodipine 5 mg every day, and Olmesartan 40 mg every day  - Continue DASH diet, exercise and monitor at home. Call if greater than 130/80.   -     amLODipine (NORVASC) 5 MG tablet; Take 1 tablet (5 mg total) by mouth daily.  Mild episode of recurrent major depressive disorder (HCC) Continue Lexapro 20 mg every day, start Wellbutrin 150 mg every day - if medication has not improved symptoms in 1 month notify the office so we can adjust medications Instructed patient to contact office or on-call physician promptly should condition worsen or any new symptoms appear. IF THE PATIENT HAS ANY SUICIDAL OR HOMICIDAL IDEATIONS, CALL THE OFFICE, DISCUSS WITH A SUPPORT MEMBER, OR GO TO THE ER IMMEDIATELY. Patient was agreeable with this plan.  -     escitalopram (LEXAPRO) 20 MG tablet; TAKE ONE TABLET BY MOUTH DAILY FOR MOOD -     buPROPion (WELLBUTRIN XL) 150 MG 24 hr tablet; Take 1 tablet (150 mg total) by mouth every morning.  Hyperlipidemia, mixed Continue Rosuvastatin, diet and exercise -     rosuvastatin (CRESTOR) 40 MG tablet; TAKE ONE TABLET BY MOUTH DAILY FOR CHOLESTEROL  Insomnia, unspecified type Practice good sleep hygiene and continue Trazodone as needed -     traZODone (DESYREL) 50 MG tablet; Take  1 tablet  1 hour before Bedtime  as needed for Sleep                                                 /                                          TAKE                                          BY                                      MOUTH       Further disposition pending results of labs. Discussed med's  effects and SE's.   Over 30 minutes of exam, counseling, chart review, and critical decision making was performed.   Future Appointments  Date Time Provider Department Center  06/10/2024  9:30 AM [facility] MM 2 [facility]  06/10/2024 10:00 AM [facility] DX DEXA 1 [facility]  10/21/2024 10:00 AM Raynelle Dick, NP [facility] None    ------------------------------------------------------------------------------------------------------------------   HPI BP 130/68   Pulse (!) 59   Temp 97.7 F (36.5 C)   Ht 5\' 2"  (1.575 m)   Wt 140 lb (63.5 kg)   SpO2 97%   BMI 25.61 kg/m  67 y.o.female presents for reevaluation of mood. She continues to  use Trazodone 50 mg at bedtime for sleep and is helping. Mood is the same because Wellbutrin unfortunately did not get ordered at last visit Last visit 10/22/23 the following changes were made:             Continue Lexapro 20 mg every day             Add Wellbutrin 150 mg every day- prescription did not get ordered             Increase Buspar 5 mg to BID and if that is not controlling anxiety increase to TID             Follow up in 1 month  She does have a history of GERD which is well controlled with omeprazole.  BP well controlled with amlodipine 5 mg every day, and Olmesartan 40 mg every day  BP Readings from Last 3 Encounters:  11/19/23 130/68  10/22/23 130/72  07/09/23 (!) 142/72  Denies headaches, chest pain, shortness of breath and dizziness   Cholesterol is fairly controlled with rosuvastatin 40 mg qd Lab Results  Component Value Date   CHOL 176 10/22/2023   HDL 66 10/22/2023   LDLCALC 85 10/22/2023   TRIG 145 10/22/2023   CHOLHDL 2.7 10/22/2023    BMI is Body mass index is 25.61 kg/m., she has been working on diet and exercise. Wt Readings from Last 3 Encounters:  11/19/23 140 lb (63.5 kg)  10/22/23 139 lb 3.2 oz (63.1 kg)  07/09/23 137 lb 6.4 oz (62.3 kg)    Past Medical History:  Diagnosis Date   Allergy     Anxiety    Depression    Esophageal stricture    GERD (gastroesophageal reflux disease)    History of COVID-19 02/23/2020   Hyperlipemia    Hypertension    Unspecified vitamin D deficiency      Allergies  Allergen Reactions   Ppd [Tuberculin Purified Protein Derivative]     + PPD with NEG CXR 1/ 2014   Clinoril [Sulindac] Rash    Current Outpatient Medications on File Prior to Visit  Medication Sig   albuterol (VENTOLIN HFA) 108 (90 Base) MCG/ACT inhaler Inhale 2 puffs into the lungs every 4 (four) hours as needed for wheezing or shortness of breath.   amLODipine (NORVASC) 5 MG tablet TAKE ONE TABLET BY MOUTH DAILY   aspirin 81 MG chewable tablet Chew 1 tablet (81 mg total) by mouth daily.   Biotin 300 MCG TABS Take 1 tablet by mouth at bedtime.   busPIRone (BUSPAR) 5 MG tablet Take 1 tablet (5 mg total) by mouth 3 (three) times daily.   CHOLECALCIFEROL PO Take 10,000 Units by mouth daily. Takes 15,000 units   escitalopram (LEXAPRO) 20 MG tablet TAKE ONE TABLET BY MOUTH DAILY FOR MOOD   fluticasone (FLONASE) 50 MCG/ACT nasal spray SPRAY TWO SPRAYS IN EACH NOSTRIL ONCE DAILY   latanoprost (XALATAN) 0.005 % ophthalmic solution Place 1 drop into both eyes at bedtime.   Magnesium 250 MG TABS Take 1 tablet by mouth 2 (two) times daily.    olmesartan (BENICAR) 40 MG tablet TAKE 1 TABLET BY MOUTH DAILY   omeprazole (PRILOSEC) 20 MG capsule TAKE ONE CAPSULE BY MOUTH EVERY NIGHT AT BEDTIME FOR ACID INDIGESTION AND REFLUX   rosuvastatin (CRESTOR) 40 MG tablet TAKE ONE TABLET BY MOUTH DAILY FOR CHOLESTEROL   timolol (TIMOPTIC) 0.5 % ophthalmic solution 1 drop 2 (two) times daily.   traZODone (DESYREL) 50 MG tablet  Take  1 tablet  1 hour before Bedtime  as needed for Sleep                                                 /                                          TAKE                                          BY                                      MOUTH   TURMERIC PO Take by mouth.    cetirizine (ZYRTEC ALLERGY) 10 MG tablet Take 1 tablet (10 mg total) by mouth daily.   Fexofenadine HCl (ALLEGRA PO) Take by mouth. (Patient not taking: Reported on 11/19/2023)   loratadine (CLARITIN) 10 MG tablet Take 10 mg by mouth daily. (Patient not taking: Reported on 11/19/2023)   No current facility-administered medications on file prior to visit.    ROS: all negative except above.   Physical Exam:  BP 130/68   Pulse (!) 59   Temp 97.7 F (36.5 C)   Ht 5\' 2"  (1.575 m)   Wt 140 lb (63.5 kg)   SpO2 97%   BMI 25.61 kg/m   General Appearance: Well nourished, in no apparent distress. Eyes: PERRLA, EOMs, conjunctiva no swelling or erythema Sinuses: No Frontal/maxillary tenderness ENT/Mouth: Ext aud canals clear, TMs without erythema, bulging. No erythema, swelling, or exudate on post pharynx.   Hearing normal.  Neck: Supple, thyroid normal.  Respiratory: Respiratory effort normal, BS equal bilaterally without rales, rhonchi, wheezing or stridor.  Cardio: RRR with no MRGs. Brisk peripheral pulses without edema.  Abdomen: Soft, + BS.   Lymphatics: Non tender without lymphadenopathy.  Musculoskeletal: Full ROM, 5/5 strength, normal gait.  Skin: Warm, dry without rashes, lesions, ecchymosis.  Neuro: Cranial nerves intact. Normal muscle tone, no cerebellar symptoms. Sensation intact.  Psych: Awake and oriented X 3,  depressed affect, Insight and Judgment appropriate.     Raynelle Dick, NP 11:30 AM [facility]

## 2023-11-19 NOTE — Patient Instructions (Signed)
Managing Depression, Adult Depression is a mental health condition that affects your thoughts, feelings, and actions. Being diagnosed with depression can bring you relief if you did not know why you have felt or behaved a certain way. It could also leave you feeling overwhelmed. Finding ways to manage your symptoms can help you feel more positive about your future. How to manage lifestyle changes Being depressed is difficult. Depression can increase the level of everyday stress. Stress can make depression symptoms worse. You may believe your symptoms cannot be managed or will never improve. However, there are many things you can try to help manage your symptoms. There is hope. Managing stress  Stress is your body's reaction to life changes and events, both good and bad. Stress can add to your feelings of depression. Learning to manage your stress can help lessen your feelings of depression. Try some of the following approaches to reducing your stress (stress reduction techniques): Listen to music that you enjoy and that inspires you. Try using a meditation app or take a meditation class. Develop a practice that helps you connect with your spiritual self. Walk in nature, pray, or go to a place of worship. Practice deep breathing. To do this, inhale slowly through your nose. Pause at the top of your inhale for a few seconds and then exhale slowly, letting yourself relax. Repeat this three or four times. Practice yoga to help relax and work your muscles. Choose a stress reduction technique that works for you. These techniques take time and practice to develop. Set aside 5-15 minutes a day to do them. Therapists can offer training in these techniques. Do these things to help manage stress: Keep a journal. Know your limits. Set healthy boundaries for yourself and others, such as saying "no" when you think something is too much. Pay attention to how you react to certain situations. You may not be able to  control everything, but you can change your reaction. Add humor to your life by watching funny movies or shows. Make time for activities that you enjoy and that relax you. Spend less time using electronics, especially at night before bed. The light from screens can make your brain think it is time to get up rather than go to bed.  Medicines Medicines, such as antidepressants, are often a part of treatment for depression. Talk with your pharmacist or health care provider about all the medicines, supplements, and herbal products that you take, their possible side effects, and what medicines and other products are safe to take together. Make sure to report any side effects you may have to your health care provider. Relationships Your health care provider may suggest family therapy, couples therapy, or individual therapy as part of your treatment. How to recognize changes Everyone responds differently to treatment for depression. As you recover from depression, you may start to: Have more interest in doing activities. Feel more hopeful. Have more energy. Eat a more regular amount of food. Have better mental focus. It is important to recognize if your depression is not getting better or is getting worse. The symptoms you had in the beginning may return, such as: Feeling tired. Eating too much or too little. Sleeping too much or too little. Feeling restless, agitated, or hopeless. Trouble focusing or making decisions. Having unexplained aches and pains. Feeling irritable, angry, or aggressive. If you or your family members notice these symptoms coming back, let your health care provider know right away. Follow these instructions at home: Activity Try to   get some form of exercise each day, such as walking. Try yoga, mindfulness, or other stress reduction techniques. Participate in group activities if you are able. Lifestyle Get enough sleep. Cut down on or stop using caffeine, tobacco,  alcohol, and any other harmful substances. Eat a healthy diet that includes plenty of vegetables, fruits, whole grains, low-fat dairy products, and lean protein. Limit foods that are high in solid fats, added sugar, or salt (sodium). General instructions Take over-the-counter and prescription medicines only as told by your health care provider. Keep all follow-up visits. It is important for your health care provider to check on your mood, behavior, and medicines. Your health care provider may need to make changes to your treatment. Where to find support Talking to others  Friends and family members can be sources of support and guidance. Talk to trusted friends or family members about your condition. Explain your symptoms and let them know that you are working with a health care provider to treat your depression. Tell friends and family how they can help. Finances Find mental health providers that fit with your financial situation. Talk with your health care provider if you are worried about access to food, housing, or medicine. Call your insurance company to learn about your co-pays and prescription plan. Where to find more information You can find support in your area from: Anxiety and Depression Association of [place] (ADAA): adaa.org Mental Health [place]: mentalhealthamerica.net National Alliance on Mental Illness: nami.org Contact a health care provider if: You stop taking your antidepressant medicines, and you have any of these symptoms: Nausea. Headache. Light-headedness. Chills and body aches. Not being able to sleep (insomnia). You or your friends and family think your depression is getting worse. Get help right away if: You have thoughts of hurting yourself or others. Get help right away if you feel like you may hurt yourself or others, or have thoughts about taking your own life. Go to your nearest emergency room or: Call 911. Call the National Suicide Prevention Lifeline at  1-800-273-8255 or 988. This is open 24 hours a day. Text the Crisis Text Line at 741741. This information is not intended to replace advice given to you by your health care provider. Make sure you discuss any questions you have with your health care provider. Document Revised: 04/04/2022 Document Reviewed: 04/04/2022 [employer] Patient Education  2024 [employer]  

## 2024-01-21 ENCOUNTER — Encounter: Payer: Self-pay | Admitting: Nurse Practitioner

## 2024-02-14 ENCOUNTER — Other Ambulatory Visit: Payer: Self-pay

## 2024-02-14 DIAGNOSIS — F330 Major depressive disorder, recurrent, mild: Secondary | ICD-10-CM

## 2024-02-14 MED ORDER — BUPROPION HCL ER (XL) 150 MG PO TB24: 150.0000 mg | ORAL_TABLET | ORAL | 2 refills | Status: DC

## 2024-02-18 ENCOUNTER — Other Ambulatory Visit: Payer: Self-pay

## 2024-02-18 DIAGNOSIS — I10 Essential (primary) hypertension: Secondary | ICD-10-CM

## 2024-02-18 DIAGNOSIS — F330 Major depressive disorder, recurrent, mild: Secondary | ICD-10-CM

## 2024-02-18 MED ORDER — BUPROPION HCL ER (XL) 150 MG PO TB24: 150.0000 mg | ORAL_TABLET | ORAL | 2 refills | Status: AC

## 2024-02-18 MED ORDER — OLMESARTAN MEDOXOMIL 40 MG PO TABS: 40.0000 mg | ORAL_TABLET | Freq: Every day | ORAL | 0 refills | Status: AC

## 2024-05-13 ENCOUNTER — Other Ambulatory Visit: Payer: Self-pay | Admitting: Family

## 2024-05-13 DIAGNOSIS — I10 Essential (primary) hypertension: Secondary | ICD-10-CM

## 2024-05-22 ENCOUNTER — Ambulatory Visit: Payer: Managed Care, Other (non HMO) | Admitting: Nurse Practitioner

## 2024-06-10 ENCOUNTER — Ambulatory Visit: Payer: Managed Care, Other (non HMO)

## 2024-06-10 ENCOUNTER — Other Ambulatory Visit: Payer: Managed Care, Other (non HMO)

## 2024-08-04 ENCOUNTER — Other Ambulatory Visit: Payer: Self-pay | Admitting: Nurse Practitioner

## 2024-08-04 DIAGNOSIS — F419 Anxiety disorder, unspecified: Secondary | ICD-10-CM

## 2024-09-05 ENCOUNTER — Other Ambulatory Visit: Payer: Self-pay | Admitting: Nurse Practitioner

## 2024-09-05 DIAGNOSIS — F330 Major depressive disorder, recurrent, mild: Secondary | ICD-10-CM

## 2024-09-19 ENCOUNTER — Other Ambulatory Visit: Payer: Self-pay | Admitting: Family

## 2024-09-19 DIAGNOSIS — F330 Major depressive disorder, recurrent, mild: Secondary | ICD-10-CM

## 2024-10-07 ENCOUNTER — Encounter: Payer: Managed Care, Other (non HMO) | Admitting: Nurse Practitioner

## 2024-10-16 ENCOUNTER — Encounter (INDEPENDENT_AMBULATORY_CARE_PROVIDER_SITE_OTHER): Payer: Self-pay | Admitting: Nurse Practitioner

## 2024-10-21 ENCOUNTER — Encounter: Payer: Managed Care, Other (non HMO) | Admitting: Nurse Practitioner

## 2024-11-20 ENCOUNTER — Other Ambulatory Visit: Payer: Self-pay | Admitting: Family

## 2024-11-20 DIAGNOSIS — I10 Essential (primary) hypertension: Secondary | ICD-10-CM

## 2024-12-11 ENCOUNTER — Other Ambulatory Visit: Payer: Self-pay | Admitting: Nurse Practitioner

## 2024-12-11 DIAGNOSIS — G4700 Insomnia, unspecified: Secondary | ICD-10-CM
